# Patient Record
Sex: Female | Born: 1950 | Race: White | Hispanic: No | Smoking: Former smoker
Health system: Southern US, Community
[De-identification: ages and names within clinical notes are randomized; demographics above are authoritative.]

## PROBLEM LIST (undated history)

## (undated) DIAGNOSIS — R001 Bradycardia, unspecified: Secondary | ICD-10-CM

## (undated) DIAGNOSIS — H547 Unspecified visual loss: Secondary | ICD-10-CM

## (undated) DIAGNOSIS — I739 Peripheral vascular disease, unspecified: Secondary | ICD-10-CM

## (undated) DIAGNOSIS — E785 Hyperlipidemia, unspecified: Secondary | ICD-10-CM

## (undated) DIAGNOSIS — H269 Unspecified cataract: Secondary | ICD-10-CM

## (undated) DIAGNOSIS — I459 Conduction disorder, unspecified: Secondary | ICD-10-CM

## (undated) DIAGNOSIS — Z87891 Personal history of nicotine dependence: Secondary | ICD-10-CM

## (undated) DIAGNOSIS — R251 Tremor, unspecified: Secondary | ICD-10-CM

## (undated) DIAGNOSIS — R011 Cardiac murmur, unspecified: Secondary | ICD-10-CM

## (undated) DIAGNOSIS — I779 Disorder of arteries and arterioles, unspecified: Secondary | ICD-10-CM

## (undated) DIAGNOSIS — I2510 Atherosclerotic heart disease of native coronary artery without angina pectoris: Secondary | ICD-10-CM

## (undated) DIAGNOSIS — I639 Cerebral infarction, unspecified: Secondary | ICD-10-CM

## (undated) HISTORY — DX: Tremor, unspecified: R25.1

## (undated) HISTORY — PX: TUBAL LIGATION: SHX77

## (undated) HISTORY — DX: Unspecified visual loss: H54.7

---

## 2015-11-28 ENCOUNTER — Inpatient Hospital Stay
Admit: 2015-11-28 | Discharge: 2015-11-29 | Disposition: A | Discharge status: Home / Self Care | Payer: MEDICAID | Attending: Emergency Medicine

## 2015-11-28 ENCOUNTER — Emergency Department: Admit: 2015-11-29 | Payer: MEDICAID | Primary: Student in an Organized Health Care Education/Training Program

## 2015-11-28 DIAGNOSIS — R531 Weakness: Secondary | ICD-10-CM

## 2015-11-28 NOTE — ED Provider Notes (Signed)
HPI Comments: Patient recently diagnosed with urinary tract infection December 5.  He has not taken any antibiotics for it.  Family states patient has had increased weakness over the past 1-2 weeks.  Has fallen and complains of bilateral knee pain.  Has not had any loss of consciousness.  No chest pain or shortness of breath.  Does have dysuria.  Son also states her right hand has been contracting more.  This was also the side of her prior stroke.    Patient is a 64 y.o. female presenting with fatigue. The history is provided by the patient. No language interpreter was used.   Fatigue   This is a new problem. The current episode started more than 1 week ago. The problem has been gradually worsening. There was no focality noted. Primary symptoms include loss of balance and disorientation.Pertinent negatives include no focal weakness, no slurred speech, no speech difficulty, no agitation, no visual change, no mental status change and no unresponsiveness. There has been no fever. Pertinent negatives include no shortness of breath, no chest pain, no vomiting, no altered mental status, no confusion, no headaches, no nausea, no bowel incontinence and no bladder incontinence. Associated medical issues include dementia and CVA.        Past Medical History:   Diagnosis Date   ??? Chronic obstructive pulmonary disease ([place])    ??? Ill-defined condition      high cholesterol   ??? Stroke [facility])        Past Surgical History:   Procedure Laterality Date   ??? Hx gyn       tubal ligation         History reviewed. No pertinent family history.    Social History     Social History   ??? Marital status: DIVORCED     Spouse name: N/A   ??? Number of children: N/A   ??? Years of education: N/A     Occupational History   ??? Not on file.     Social History Main Topics   ??? Smoking status: Current Every Day Smoker   ??? Smokeless tobacco: Not on file   ??? Alcohol use No   ??? Drug use: No   ??? Sexual activity: Not on file     Other Topics Concern    ??? Not on file     Social History Narrative   ??? No narrative on file         ALLERGIES: Review of patient's allergies indicates not on file.    Review of Systems   Constitutional: Positive for fatigue. Negative for chills and fever.   HENT: Negative for rhinorrhea and sore throat.    Eyes: Negative for pain and redness.   Respiratory: Negative for chest tightness, shortness of breath and wheezing.    Cardiovascular: Negative for chest pain and leg swelling.   Gastrointestinal: Positive for abdominal pain. Negative for bowel incontinence, diarrhea, nausea and vomiting.   Genitourinary: Positive for dysuria. Negative for bladder incontinence, hematuria, vaginal bleeding and vaginal discharge.   Musculoskeletal: Negative for back pain, gait problem, neck pain and neck stiffness.   Skin: Negative for color change and rash.   Neurological: Positive for weakness and loss of balance. Negative for focal weakness, facial asymmetry, speech difficulty, light-headedness, numbness and headaches.   Psychiatric/Behavioral: Negative for agitation and confusion.       Vitals:    11/28/15 1909   BP: 112/67   Pulse: 70   Resp: 18   Temp:  97.5 ??F (36.4 ??C)   SpO2: 99%   Weight: 47.6 kg (105 lb)   Height: 5' 2"  (1.575 m)            Physical Exam   Constitutional: She is oriented to person, place, and time. She appears well-developed and well-nourished. No distress.   HENT:   Head: Normocephalic and atraumatic.   Eyes: Conjunctivae and EOM are normal.   Neck: Normal range of motion. Neck supple.   Cardiovascular: Normal rate and regular rhythm.    No murmur heard.  Pulmonary/Chest: Effort normal and breath sounds normal. She has no wheezes.   Abdominal: Soft. Bowel sounds are normal. There is tenderness (suprapubic). There is no rebound and no guarding.   Musculoskeletal: Normal range of motion. She exhibits no edema or tenderness.   Slight contraction noted of right hand but still with good strength only mildly weaker than left.     Neurological: She is alert and oriented to person, place, and time. No cranial nerve deficit. She exhibits normal muscle tone. Coordination normal.   Skin: Skin is warm and dry.   Nursing note and vitals reviewed.       MDM  Number of Diagnoses or Management Options  Diagnosis management comments: Weakness and possible dehydration. Will discharge.        Amount and/or Complexity of Data Reviewed  Clinical lab tests: ordered and reviewed  Tests in the radiology section of CPT??: ordered and reviewed  Tests in the medicine section of CPT??: ordered and reviewed    Patient Progress  Patient progress: stable    ED Course       Procedures      EKG: normal sinus rhythm, nonspecific ST and T waves changes. Rate 65.        XR CHEST PA LAT (Final result) Result time: 11/28/15 20:04:17   ?? Final result by Leslie Dales, MD (11/28/15 20:04:17)   ?? Impression:   ?? IMPRESSION: No acute cardiopulmonary abnormality.   ?? Narrative:   ?? PA AND LATERAL CHEST X-RAY.    Clinical Indication: Increased weakness    Comparison: No prior    Findings: 2 views of the chest submitted demonstrate the cardiac silhouette and  mediastinum to be unremarkable. There is no pleural effusion or pneumothorax.  The lung parenchyma is clear. The bones are osteopenic.          Results Include:    Recent Results (from the past 24 hour(s))   CBC WITH AUTOMATED DIFF    Collection Time: 11/28/15  7:10 PM   Result Value Ref Range    WBC 9.7 4.3 - 11.1 K/uL    RBC 4.10 4.05 - 5.25 M/uL    HGB 13.3 11.7 - 15.4 g/dL    HCT 40.6 35.8 - 46.3 %    MCV 99.0 (H) 79.6 - 97.8 FL    MCH 32.4 26.1 - 32.9 PG    MCHC 32.8 31.4 - 35.0 g/dL    RDW 12.9 11.9 - 14.6 %    PLATELET 254 150 - 450 K/uL    MPV 11.1 10.8 - 14.1 FL    DF AUTOMATED      NEUTROPHILS 66 43 - 78 %    LYMPHOCYTES 27 13 - 44 %    MONOCYTES 6 4.0 - 12.0 %    EOSINOPHILS 1 0.5 - 7.8 %    BASOPHILS 0 0.0 - 2.0 %    IMMATURE GRANULOCYTES 0.2 0.0 - 5.0 %  ABS. NEUTROPHILS 6.4 1.7 - 8.2 K/UL     ABS. LYMPHOCYTES 2.6 0.5 - 4.6 K/UL    ABS. MONOCYTES 0.6 0.1 - 1.3 K/UL    ABS. EOSINOPHILS 0.1 0.0 - 0.8 K/UL    ABS. BASOPHILS 0.0 0.0 - 0.2 K/UL    ABS. IMM. GRANS. 0.0 0.0 - 0.5 K/UL   METABOLIC PANEL, COMPREHENSIVE    Collection Time: 11/28/15  7:10 PM   Result Value Ref Range    Sodium 145 136 - 145 mmol/L    Potassium 3.5 3.5 - 5.1 mmol/L    Chloride 107 98 - 107 mmol/L    CO2 29 21 - 32 mmol/L    Anion gap 9 7 - 16 mmol/L    Glucose 96 65 - 100 mg/dL    BUN 10 8 - 23 MG/DL    Creatinine 0.59 (L) 0.6 - 1.0 MG/DL    GFR est AA >60 >60 ml/min/1.71m    GFR est non-AA >60 >60 ml/min/1.730m   Calcium 8.7 8.3 - 10.4 MG/DL    Bilirubin, total 0.2 0.2 - 1.1 MG/DL    ALT 27 12 - 65 U/L    AST 22 15 - 37 U/L    Alk. phosphatase 132 50 - 136 U/L    Protein, total 7.3 6.3 - 8.2 g/dL    Albumin 3.3 3.2 - 4.6 g/dL    Globulin 4.0 (H) 2.3 - 3.5 g/dL    A-G Ratio 0.8 (L) 1.2 - 3.5     TROPONIN I    Collection Time: 11/28/15  7:10 PM   Result Value Ref Range    Troponin-I, Qt. <0.02 (L) 0.02 - 0.05 NG/ML   LIPASE    Collection Time: 11/28/15  7:10 PM   Result Value Ref Range    Lipase 140 73 - 393 U/L   EKG, 12 LEAD, INITIAL    Collection Time: 11/28/15  7:15 PM   Result Value Ref Range    Systolic BP  mmHg    Diastolic BP  mmHg    Ventricular Rate 65 BPM    Atrial Rate 65 BPM    P-R Interval 156 ms    QRS Duration 84 ms    Q-T Interval 440 ms    QTC Calculation (Bezet) 457 ms    Calculated P Axis 67 degrees    Calculated R Axis 70 degrees    Calculated T Axis 50 degrees    Diagnosis Normal sinus rhythm  Normal ECG        UA neg.       CT HEAD WO CONT (Final result) Result time: 11/28/15 21:06:15   ?? Final result by RoLeslie DalesMD (11/28/15 21:06:15)   ?? Impression:   ?? IMPRESSION:   1. Marked atrophy of the bilateral frontal lobes with extensive underlying  subcortical white matter hypoattenuation. Encephalomalacia is also a  consideration. Note the more posterior brain is spared.   2. No acute intracranial abnormality.       ?? Narrative:   ?? CT of the head without contrast.    CLINICAL INDICATION: Increased weakness over the last week with multiple falls  and head trauma    PROCEDURE: Serial thin section axial images are obtained from the cranial vertex  through the skull base without the administration of intravenous contrast.   Radiation dose reduction techniques were used for this study. Our CT scanners  use one or all of the following: Automated exposure control, adjusted of the  mA  and/or kV according to patient size, iterative reconstruction    COMPARISON: No prior.    FINDINGS: There is no acute intracranial hemorrhage, mass, or mass effect. No  abnormal extra-axial fluid collections identified. There is no hydrocephalus.  The basilar cisterns are widely patent. Marked, bilateral frontal lobe atrophy  with extensive subcortical white matter hypoattenuation in the bilateral frontal  lobes with relative sparing of the parietal and temporal lobes as well as the  occipital lobes. Mild encephalomalacia of the frontal lobes is a possibility. No  skull fracture or aggressive osseous lesion noted. The mastoid air cells and  included paranasal sinuses are clear.     ??    ??    ?? XR CHEST PA LAT (Final result) Result time: 11/28/15 20:04:17   ?? Final result by Leslie Dales, MD (11/28/15 20:04:17)   ?? Impression:   ?? IMPRESSION: No acute cardiopulmonary abnormality.   ?? Narrative:   ?? PA AND LATERAL CHEST X-RAY.    Clinical Indication: Increased weakness    Comparison: No prior    Findings: 2 views of the chest submitted demonstrate the cardiac silhouette and  mediastinum to be unremarkable. There is no pleural effusion or pneumothorax.  The lung parenchyma is clear. The bones are osteopenic.     ??    ??

## 2015-11-28 NOTE — ED Notes (Signed)
I have reviewed discharge instructions with the patient.  The patient verbalized understanding. Patient appears in no acute distress upon discharge.

## 2015-11-28 NOTE — ED Triage Notes (Addendum)
Pt arrives with son and caregiver. Visitors states pt with increased weakness over last week with multiple falls. Pt admits to hitting head. Pt denies syncope. C/o bilateral knee pain. Denies chest pain or shortness of breath. Denies n/v. Admits to diarrhea. Denies fever. Admits to urinary pain. States diagnosed with uti 12/5 at [facility] however family unable to fill abx until medicaid activated couple days pta. Family also states contractures to right hand, onset approx 1 week pta. 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

## 2015-11-29 LAB — METABOLIC PANEL, COMPREHENSIVE
A-G Ratio: 0.8 — ABNORMAL LOW (ref 1.2–3.5)
ALT (SGPT): 27 U/L (ref 12–65)
AST (SGOT): 22 U/L (ref 15–37)
Albumin: 3.3 g/dL (ref 3.2–4.6)
Alk. phosphatase: 132 U/L (ref 50–136)
Anion gap: 9 mmol/L (ref 7–16)
BUN: 10 MG/DL (ref 8–23)
Bilirubin, total: 0.2 MG/DL (ref 0.2–1.1)
CO2: 29 mmol/L (ref 21–32)
Calcium: 8.7 MG/DL (ref 8.3–10.4)
Chloride: 107 mmol/L (ref 98–107)
Creatinine: 0.59 MG/DL — ABNORMAL LOW (ref 0.6–1.0)
GFR est AA: >60 mL/min/{1.73_m2} (ref >60)
GFR est non-AA: >60 mL/min/{1.73_m2} (ref >60)
Globulin: 4 g/dL — ABNORMAL HIGH (ref 2.3–3.5)
Glucose: 96 mg/dL (ref 65–100)
Potassium: 3.5 mmol/L (ref 3.5–5.1)
Protein, total: 7.3 g/dL (ref 6.3–8.2)
Sodium: 145 mmol/L (ref 136–145)

## 2015-11-29 LAB — EKG, 12 LEAD, INITIAL
Atrial Rate: 65 {beats}/min
Calculated P Axis: 67 degrees
Calculated R Axis: 70 degrees
Calculated T Axis: 50 degrees
Diagnosis: NORMAL
P-R Interval: 156 ms
Q-T Interval: 440 ms
QRS Duration: 84 ms
QTC Calculation (Bezet): 457 ms
Ventricular Rate: 65 {beats}/min

## 2015-11-29 LAB — CBC WITH AUTOMATED DIFF
ABS. BASOPHILS: 0 10*3/uL (ref 0.0–0.2)
ABS. EOSINOPHILS: 0.1 10*3/uL (ref 0.0–0.8)
ABS. IMM. GRANS.: 0 10*3/uL (ref 0.0–0.5)
ABS. LYMPHOCYTES: 2.6 10*3/uL (ref 0.5–4.6)
ABS. MONOCYTES: 0.6 10*3/uL (ref 0.1–1.3)
ABS. NEUTROPHILS: 6.4 10*3/uL (ref 1.7–8.2)
BASOPHILS: 0 % (ref 0.0–2.0)
EOSINOPHILS: 1 % (ref 0.5–7.8)
HCT: 40.6 % (ref 35.8–46.3)
HGB: 13.3 g/dL (ref 11.7–15.4)
IMMATURE GRANULOCYTES: 0.2 % (ref 0.0–5.0)
LYMPHOCYTES: 27 % (ref 13–44)
MCH: 32.4 PG (ref 26.1–32.9)
MCHC: 32.8 g/dL (ref 31.4–35.0)
MCV: 99 FL — ABNORMAL HIGH (ref 79.6–97.8)
MONOCYTES: 6 % (ref 4.0–12.0)
MPV: 11.1 FL (ref 10.8–14.1)
NEUTROPHILS: 66 % (ref 43–78)
PLATELET: 254 10*3/uL (ref 150–450)
RBC: 4.1 M/uL (ref 4.05–5.25)
RDW: 12.9 % (ref 11.9–14.6)
WBC: 9.7 10*3/uL (ref 4.3–11.1)

## 2015-11-29 LAB — LIPASE: Lipase: 140 U/L (ref 73–393)

## 2015-11-29 LAB — TROPONIN I: Troponin-I, Qt.: <0.02 NG/ML — ABNORMAL LOW (ref 0.02–0.05)

## 2015-11-29 MED ORDER — SODIUM CHLORIDE 0.9% BOLUS IV
0.9 % | Freq: Once | INTRAVENOUS | Status: AC
Start: 2015-11-29 — End: 2015-11-28
  Administered 2015-11-29: 02:00:00 via INTRAVENOUS

## 2015-11-29 MED ORDER — KETOROLAC TROMETHAMINE 30 MG/ML INJECTION
30 mg/mL (1 mL) | INTRAMUSCULAR | Status: AC
Start: 2015-11-29 — End: 2015-11-28
  Administered 2015-11-29: 02:00:00 via INTRAVENOUS

## 2015-11-29 MED ORDER — ONDANSETRON (PF) 4 MG/2 ML INJECTION
4 mg/2 mL | INTRAMUSCULAR | Status: AC
Start: 2015-11-29 — End: 2015-11-28
  Administered 2015-11-29: 02:00:00 via INTRAVENOUS

## 2015-11-29 MED FILL — ONDANSETRON (PF) 4 MG/2 ML INJECTION: 4 mg/2 mL | INTRAMUSCULAR | Qty: 2

## 2015-11-29 MED FILL — KETOROLAC TROMETHAMINE 30 MG/ML INJECTION: 30 mg/mL (1 mL) | INTRAMUSCULAR | Qty: 1

## 2018-03-23 ENCOUNTER — Inpatient Hospital Stay (HOSPITAL_COMMUNITY): Payer: Medicare Other

## 2018-03-23 ENCOUNTER — Inpatient Hospital Stay (HOSPITAL_COMMUNITY)
Admission: AD | Admit: 2018-03-23 | Discharge: 2018-03-24 | Disposition: A | Discharge status: Home / Self Care | Payer: Medicare Other | Source: Ambulatory Visit | Attending: Emergency Medicine | Admitting: Emergency Medicine

## 2018-03-23 ENCOUNTER — Encounter (HOSPITAL_COMMUNITY): Payer: Self-pay

## 2018-03-23 DIAGNOSIS — R112 Nausea with vomiting, unspecified: Secondary | ICD-10-CM | POA: Insufficient documentation

## 2018-03-23 DIAGNOSIS — I10 Essential (primary) hypertension: Secondary | ICD-10-CM | POA: Diagnosis not present

## 2018-03-23 DIAGNOSIS — J449 Chronic obstructive pulmonary disease, unspecified: Secondary | ICD-10-CM | POA: Diagnosis not present

## 2018-03-23 DIAGNOSIS — N390 Urinary tract infection, site not specified: Secondary | ICD-10-CM

## 2018-03-23 DIAGNOSIS — Z79899 Other long term (current) drug therapy: Secondary | ICD-10-CM | POA: Insufficient documentation

## 2018-03-23 DIAGNOSIS — R001 Bradycardia, unspecified: Secondary | ICD-10-CM | POA: Insufficient documentation

## 2018-03-23 DIAGNOSIS — Z880 Allergy status to penicillin: Secondary | ICD-10-CM | POA: Diagnosis not present

## 2018-03-23 DIAGNOSIS — F17200 Nicotine dependence, unspecified, uncomplicated: Secondary | ICD-10-CM | POA: Insufficient documentation

## 2018-03-23 DIAGNOSIS — I739 Peripheral vascular disease, unspecified: Secondary | ICD-10-CM | POA: Diagnosis not present

## 2018-03-23 DIAGNOSIS — I6930 Unspecified sequelae of cerebral infarction: Secondary | ICD-10-CM | POA: Insufficient documentation

## 2018-03-23 DIAGNOSIS — R531 Weakness: Secondary | ICD-10-CM | POA: Insufficient documentation

## 2018-03-23 DIAGNOSIS — R079 Chest pain, unspecified: Secondary | ICD-10-CM | POA: Insufficient documentation

## 2018-03-23 DIAGNOSIS — H547 Unspecified visual loss: Secondary | ICD-10-CM | POA: Insufficient documentation

## 2018-03-23 DIAGNOSIS — R197 Diarrhea, unspecified: Secondary | ICD-10-CM | POA: Insufficient documentation

## 2018-03-23 HISTORY — DX: Cardiac murmur, unspecified: R01.1

## 2018-03-23 HISTORY — DX: Cerebral infarction, unspecified: I63.9

## 2018-03-23 LAB — CBC WITH DIFFERENTIAL/PLATELET
BASOS ABS: 0 10*3/uL (ref 0.0–0.1)
Basophils Relative: 1 %
EOS PCT: 3 %
Eosinophils Absolute: 0.2 10*3/uL (ref 0.0–0.7)
HEMATOCRIT: 40.8 % (ref 36.0–46.0)
Hemoglobin: 13.3 g/dL (ref 12.0–15.0)
Lymphocytes Relative: 40 %
Lymphs Abs: 3.2 10*3/uL (ref 0.7–4.0)
MCH: 32.8 pg (ref 26.0–34.0)
MCHC: 32.6 g/dL (ref 30.0–36.0)
MCV: 100.5 fL — ABNORMAL HIGH (ref 78.0–100.0)
Monocytes Absolute: 0.7 10*3/uL (ref 0.1–1.0)
Monocytes Relative: 8 %
NEUTROS ABS: 4 10*3/uL (ref 1.7–7.7)
Neutrophils Relative %: 48 %
PLATELETS: 278 10*3/uL (ref 150–400)
RBC: 4.06 MIL/uL (ref 3.87–5.11)
RDW: 13.2 % (ref 11.5–15.5)
WBC: 8.1 10*3/uL (ref 4.0–10.5)

## 2018-03-23 LAB — URINALYSIS, ROUTINE W REFLEX MICROSCOPIC
Bilirubin Urine: NEGATIVE
GLUCOSE, UA: NEGATIVE mg/dL
Ketones, ur: NEGATIVE mg/dL
Nitrite: NEGATIVE
Protein, ur: NEGATIVE mg/dL
Specific Gravity, Urine: 1.023 (ref 1.005–1.030)
pH: 5 (ref 5.0–8.0)

## 2018-03-23 LAB — COMPREHENSIVE METABOLIC PANEL
ALT: 17 U/L (ref 14–54)
AST: 24 U/L (ref 15–41)
Albumin: 3.4 g/dL — ABNORMAL LOW (ref 3.5–5.0)
Alkaline Phosphatase: 103 U/L (ref 38–126)
Anion gap: 9 (ref 5–15)
BUN: 13 mg/dL (ref 6–20)
CHLORIDE: 109 mmol/L (ref 101–111)
CO2: 22 mmol/L (ref 22–32)
CREATININE: 0.72 mg/dL (ref 0.44–1.00)
Calcium: 9.1 mg/dL (ref 8.9–10.3)
GFR calc Af Amer: >60 mL/min (ref >60)
GFR calc non Af Amer: >60 mL/min (ref >60)
GLUCOSE: 111 mg/dL — AB (ref 65–99)
Potassium: 3.7 mmol/L (ref 3.5–5.1)
Sodium: 140 mmol/L (ref 135–145)
Total Bilirubin: 0.8 mg/dL (ref 0.3–1.2)
Total Protein: 6.4 g/dL — ABNORMAL LOW (ref 6.5–8.1)

## 2018-03-23 LAB — C DIFFICILE QUICK SCREEN W PCR REFLEX
C Diff antigen: NEGATIVE
C Diff interpretation: NOT DETECTED
C Diff toxin: NEGATIVE

## 2018-03-23 LAB — I-STAT TROPONIN, ED
TROPONIN I, POC: 0 ng/mL (ref 0.00–0.08)
Troponin i, poc: 0.01 ng/mL (ref 0.00–0.08)

## 2018-03-23 LAB — LIPASE, BLOOD: Lipase: 34 U/L (ref 11–51)

## 2018-03-23 MED ORDER — NITROFURANTOIN MONOHYD MACRO 100 MG PO CAPS: 100.0000 mg | ORAL_CAPSULE | Freq: Two times a day (BID) | ORAL | 0 refills | Status: AC

## 2018-03-23 MED ORDER — GABAPENTIN 300 MG PO CAPS: 300.0000 mg | ORAL_CAPSULE | Freq: Three times a day (TID) | ORAL | 0 refills | Status: AC

## 2018-03-23 MED ORDER — SODIUM CHLORIDE 0.9 % IV SOLN
Freq: Once | INTRAVENOUS | Status: AC
  Administered 2018-03-23: 15:00:00 via INTRAVENOUS

## 2018-03-23 MED ORDER — ASPIRIN 81 MG PO CHEW: 324.0000 mg | CHEWABLE_TABLET | Freq: Once | ORAL | Status: DC

## 2018-03-23 MED ORDER — OMEPRAZOLE 20 MG PO CPDR: 20.0000 mg | DELAYED_RELEASE_CAPSULE | Freq: Two times a day (BID) | ORAL | 0 refills | Status: DC

## 2018-03-23 MED ORDER — BUSPIRONE HCL 15 MG PO TABS: 7.5000 mg | ORAL_TABLET | Freq: Two times a day (BID) | ORAL | 0 refills | Status: AC

## 2018-03-23 MED ORDER — ACETAMINOPHEN 325 MG PO TABS
650.0000 mg | ORAL_TABLET | Freq: Once | ORAL | Status: AC
Start: 2018-03-23 — End: 2018-03-23
  Administered 2018-03-23: 650 mg via ORAL
  Filled 2018-03-23: qty 2

## 2018-03-23 MED ORDER — NITROFURANTOIN MONOHYD MACRO 100 MG PO CAPS
100.0000 mg | ORAL_CAPSULE | Freq: Once | ORAL | Status: AC
  Administered 2018-03-23: 100 mg via ORAL
  Filled 2018-03-23: qty 1

## 2018-03-23 MED ORDER — SODIUM CHLORIDE 0.9 % IV BOLUS
500.0000 mL | Freq: Once | INTRAVENOUS | Status: AC
Start: 2018-03-23 — End: 2018-03-23
  Administered 2018-03-23: 500 mL via INTRAVENOUS

## 2018-03-23 NOTE — ED Provider Notes (Signed)
I assumed care of patient at shift change, briefly patient is here for evaluation of left arm and leg weakness and musculoskeletal chest pain.  Please see note from previous provider for full H and P.    Patient's MRI came back without acute abnormalities.  Patient has significant social issues, including the fact that approximately 2 weeks ago her son took her out of the nursing home that she was in in [place], they took a bus here and she came here by ambulance.  Reportedly her son does not have a car and therefore would be unable to pick her up.  Patient does not meet admission criteria.  Blood pressure medication held due to bradycardia.  Patient was seen by case management and social work.  Attempted to discharge, however patient does not have a ride and I do not feel it would be safe for her to be left alone at the motel.  Patient will be a border in the ER.  Recommend continued social work/case management involvement in the morning.  Dg Chest 2 View  Result Date: 03/23/2018 CLINICAL DATA:  Left-sided chest pain and shortness of breath. EXAM: CHEST - 2 VIEW COMPARISON:  None. FINDINGS: The heart size and pulmonary vascularity are normal and the lungs are clear. No effusions. Osteopenia. Moderate compression deformity of 1 of the lower thoracic vertebra, age indeterminate. IMPRESSION: 1. Compression deformity of 1 of the lower thoracic vertebra, age indeterminate. 2. Osteopenia. 3. No other significant abnormalities. Electronically Signed   By: Francene BoyersJames  Maxwell M.D.   On: 03/23/2018 07:50   Ct Head Wo Contrast  Result Date: 03/23/2018 CLINICAL DATA:  Weakness EXAM: CT HEAD WITHOUT CONTRAST TECHNIQUE: Contiguous axial images were obtained from the base of the skull through the vertex without intravenous contrast. COMPARISON:  None. FINDINGS: Brain: Areas of prior ischemia are noted in the parietal lobes bilaterally near the vertex. Mild atrophic changes are noted. No findings to suggest acute  hemorrhage, acute infarction or space-occupying mass lesion are noted. Vascular: No hyperdense vessel or unexpected calcification. Skull: Normal. Negative for fracture or focal lesion. Sinuses/Orbits: No acute finding. Other: None. IMPRESSION: Chronic atrophic and ischemic changes without acute abnormality. Electronically Signed   By: Alcide CleverMark  Lukens M.D.   On: 03/23/2018 07:55   Mr Brain Wo Contrast  Result Date: 03/23/2018 CLINICAL DATA:  67 y/o F; left upper extremity and left lower extremity weakness. Left facial droop and slurred speech. EXAM: MRI HEAD WITHOUT CONTRAST TECHNIQUE: Multiplanar, multiecho pulse sequences of the brain and surrounding structures were obtained without intravenous contrast. COMPARISON:  03/23/2018 CT head FINDINGS: Brain: No acute infarction, hemorrhage, hydrocephalus, extra-axial collection or mass lesion. Chronic infarctions are present in the bilateral frontal lobes and right parietal lobe. There is faint siderosis associated with the left frontal cortical infarction. There are microvascular ischemic changes of white matter and parenchymal volume loss of the brain. Vascular: Normal flow voids. Skull and upper cervical spine: Normal marrow signal. Sinuses/Orbits: Opacification of the right mastoid tips. Left intra-ocular lens replacement. Otherwise negative. Other: None. IMPRESSION: 1. No acute intracranial abnormality identified. 2. Bifrontal and right parietal chronic infarctions. Electronically Signed   By: Mitzi HansenLance  Furusawa-Stratton M.D.   On: 03/23/2018 17:18       Cristina GongHammond, Elon Lomeli W, PA-C 03/24/18 [id number]    Linwood DibblesKnapp, Jon, MD 03/24/18 1224

## 2018-03-23 NOTE — Discharge Instructions (Addendum)
Today your lab work was normal. You had a normal white blood cell count and a normal hemoglobin. You kidney function and liver function were normal. Your electrolytes were normal. Your cardiac enzymes were normal. Your urinalysis showed evidence of a possible infection and you were given an antibiotic to treat this infection. The antibiotic is called Macrobid. Please discuss with your pharmacist about potential interactions of this medication with your current medications.  I did not re-prescribe your lopressor as your heart rate is low and this could make it worse.    Your chest xray did not show any evidence of pneumonia or a collapsed lung. The CT scan of your head did not show any acute abnormality.   You will need to follow up with your primary care doctor in 3-3 days for evaluation.  Your primary care doctor there is information on your discharge paperwork for instructions to establish care with a primary doctor.  You  will need to return to the ER for any weakness, chest pain, shortness of breath, abdominal pain, persistent vomiting, fevers or any new or worsening symptoms.

## 2018-03-23 NOTE — ED Provider Notes (Signed)
Medical screening examination/treatment/procedure(s) were conducted as a shared visit with non-physician practitioner(s) and myself.  I personally evaluated the patient during the encounter. Briefly, the patient is a 67 y.o. female here with 2 days of vomiting and diarrhea with mild dysuria and questionable left facial droop with left arm weakeness noted by son yesterday. Droop and weakness now resolved. H/o CVA with right sided deficits. Exam with mild bilateral lower extremity weakness.  5 out of 5 BUE upper extremities strength. No facial droop.  Patient also complains of left shoulder, chest and arm pain. Pain is consistent with muscular pain.  EKG without acute ischemic changes.  Initial troponin negative.  Negative chest x-ray.  CT head unremarkable.  Screening labs negative.  Will obtain an MRI to rule out CVA.  MRI pending.  Patient signed out to oncoming team.   EKG Interpretation  Date/Time:  Wednesday March 23 2018 06:54:32 EDT Ventricular Rate:  85 PR Interval:    QRS Duration: 91 QT Interval:  472 QTC Calculation: 484 R Axis:   73 Text Interpretation:  Sinus rhythm Ventricular bigeminy Minimal ST depression, inferior leads NO STEMI No old tracing to compare Confirmed by Drema Pryardama, Pedro [phone]([id number]) on 03/23/2018 6:58:55 AM            Eudelia Bunchardama, Amadeo GarnetPedro Eduardo, MD 03/23/18 [phone]

## 2018-03-23 NOTE — ED Notes (Signed)
Pt states she is not claustrophobic; would like pain meds for her lower back pain. MD aware

## 2018-03-23 NOTE — ED Triage Notes (Signed)
Pt. [facility] reports of n/v/d. Pt. Also reports weakness. Has hx. Of stroke x4 with R side deficit. Pt. Is legally blind. A/0 X4

## 2018-03-23 NOTE — ED Notes (Signed)
Pts son would like an update on pts status.  Mary HarryNicolas Cook: [phone]

## 2018-03-23 NOTE — Clinical Social Work Note (Signed)
Clinical Social Work Assessment  Patient Details  Name: Mary Nguyen MRN: [id number] Date of Birth: Jul 10, 1951  Date of referral:  03/23/18               Reason for consult:  Other (Comment Required)(Pt living in hotel)                Permission sought to share information with:  Case Manager, Family Supports Permission granted to share information::  Yes, Verbal Permission Granted  Name::        Agency::     Relationship::     Contact Information:     Housing/Transportation Living arrangements for the past 2 months:  [place], Hotel/Motel Source of Information:  Patient Patient Interpreter Needed:  None Criminal Activity/Legal Involvement Pertinent to Current Situation/Hospitalization:  No - Comment as needed Significant Relationships:  Adult Children Lives with:  Adult Children Do you feel safe going back to the place where you live?  Yes Need for family participation in patient care:  Yes (Comment)  Care giving concerns:  Care giving concerns expressed for pt due to living in a hotel in [place]. Pt is ambulatory, but needs assistance with walking due to blindness. Pt was recently discharged from a SNF in [place] area last week. After d/c from SNF, pt, pt's son, and pt's son's girlfriend moved to [facility] and are staying at [facility].   Social Worker assessment / plan:  CSW consulted due to pt's living situation. CSW and CM met with pt at pt's bedside. Pt informed staff that she recently moved to [place] from [place] [employer] area. She is staying at [facility] with her son and her son's girlfriend. Pt came to [facility] from [facility], where she became ill while visiting her son's girlfriend in the hospital.   Employment status:    Insurance information:  Medicare PT Recommendations:  Not assessed at this time Information / Referral to community resources:  Other (Comment Required)([employer])  Patient/Family's Response  to care:  Pt's is receiving continued work up at this time. Pt and pt's family are agreeable to plan of care.   Patient/Family's Understanding of and Emotional Response to Diagnosis, Current Treatment, and Prognosis:  Pt and pt's son did not have any questions at this time.   Emotional Assessment Appearance:  Appears older than stated age Attitude/Demeanor/Rapport:    Affect (typically observed):  Accepting, Calm, Pleasant Orientation:  Oriented to Self, Oriented to Place, Oriented to  Time, Oriented to Situation Alcohol / Substance use:    Psych involvement (Current and /or in the community):  No (Comment)  Discharge Needs  Concerns to be addressed:  Home Safety Concerns Readmission within the last 30 days:  No Current discharge risk:  None Barriers to Discharge:  No Barriers Identified   Wendelyn Breslow, LCSW 03/23/2018, 7:59 PM

## 2018-03-23 NOTE — Progress Notes (Signed)
CSW received verbal permission from pt to call pt's son, Janyth Pupaicholas. CSW called son at [phone]. CSW left HIPAA complaint voicemail for son.   CSW spoke with pt's son, Janyth Pupaicholas, at [phone], informing him that pt is ready for discharge.   Update: More tests are being conducted on pt. CSW called Janyth Pupaicholas back and updated him.   Montine CircleKelsy Ricquel Foulk, Silverio LayLCSWA [facility]  [phone]

## 2018-03-23 NOTE — Care Management (Addendum)
ED CM met with patient at bedside to discuss care transitional planning. Patient reports being  discharged from a SNF in [place] recently. Patient's son and his pregnant girlfriend decided to relocate to [facility] and brought her along.  Patient and Son staying [facility].  Son's SO was taken to Maternity Admission for evaluation where patient began having n/v/d and was evaluated and transported to [facility] ED for further evaluation. Patient states, she needs to get her medication that she did not receive from the snf, and ED  follow up. Discussed [employer] in the area.  Patient agreeable to establish care with [facility]

## 2018-03-23 NOTE — ED Notes (Addendum)
Placed pt on bed pan.

## 2018-03-23 NOTE — ED Triage Notes (Signed)
Please call Family at  [phone]

## 2018-03-23 NOTE — MAU Note (Signed)
Pt reporting  Diarrhea since early Tuesday morning. Approximately 5 episodes.  Pt has hx of 4 strokes, latest one in 2016 (right side).  COPD, peripheral artery disease. Pt also blind since 2013. Pt lives in [place], has just moved with son. Was previously in nursing home.

## 2018-03-23 NOTE — ED Notes (Signed)
Got patient off the bedpan patient is resting with call bell in reach 
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
## 2018-03-23 NOTE — ED Provider Notes (Signed)
[facility] EMERGENCY DEPARTMENT Provider Note   CSN: [id number] Arrival date & time: 03/23/18  0221     History   Chief Complaint Chief Complaint  Patient presents with  . Extremity Weakness  . Chest Pain  . Emesis  . Diarrhea    HPI Boneta Standre is a 67 y.o. female.  HPI   Patient is a 67 year old female with history of heart murmur, stroke (chronic right sided deficits), and blindness bilaterally who presents the ED today to be evaluated for left upper extremity and left lower extremity weakness that her son states he noted when he arrived home from work yesterday around 6 PM.  He also felt that patient had left-sided facial droop as well as slurred speech.  He feels that her slurred speech and facial droop have improved today.  On evaluation, pt states she has not noticed any new weakness to her left upper and left lower extremity. She does endorse pain to the left upper extremity which she thinks started this morning. Also c/o paresthesias/numbness to the left hand which have been intermittent for several weeks. She also states that she has left-sided chest pain and left upper back pain which she thinks also began this morning.  She has had shortness of breath though it is intermittent.  She is also complaining of abdominal pain which she says she has had chronically and it is unchanged today.  She also endorses nausea, vomiting, and diarrhea.  States that nausea and vomiting began last week and she only had one episode of vomiting at home.  She has had no further episodes of vomiting though she has had intermittent diarrhea throughout the week.  She denies seeing any blood in her stool.  She is endorsing dysuria, but denies hematuria or frequency.  Patient son states that patient has history of dementia though is normally oriented.  He states that he and the patient recently moved here from [address].  In [place] patient was admitted to a nursing home for a  heart problem.  She has been out of the nursing home for about 2 weeks now since they moved to [place].  Patient states she has a history of low heart rate.  She denies a known history of atrial fibrillation.  Past Medical History:  Diagnosis Date  . Heart murmur   . Stroke [facility])     There are no active problems to display for this patient.     OB History   None      Home Medications    Prior to Admission medications   Medication Sig Start Date End Date Taking? Authorizing Provider  busPIRone (BUSPAR) 15 MG tablet Take 7.5 mg by mouth 2 (two) times daily.   Yes [provider]  gabapentin (NEURONTIN) 300 MG capsule Take 300 mg by mouth every 6 (six) hours.   Yes [provider]  metoprolol tartrate (LOPRESSOR) 25 MG tablet Take 25 mg by mouth 2 (two) times daily.   Yes [provider]  omeprazole (PRILOSEC) 20 MG capsule Take 20 mg by mouth 2 (two) times daily before a meal.   Yes [provider]  nitrofurantoin, macrocrystal-monohydrate, (MACROBID) 100 MG capsule Take 1 capsule (100 mg total) by mouth 2 (two) times daily for 5 days. 03/23/18 03/28/18  Chester Sibert S, PA-C    Family History No family history on file.  Social History Social History   Tobacco Use  . Smoking status: Current Every Day Smoker  Substance Use Topics  .  Alcohol use: Not on file  . Drug use: Not on file     Allergies   Penicillins   Review of Systems Review of Systems  Constitutional: Negative for fever.  HENT: Negative for sore throat.   Eyes:       Blind bilat  Respiratory: Positive for shortness of breath. Negative for cough.   Cardiovascular: Positive for chest pain.  Gastrointestinal: Positive for abdominal pain. Negative for constipation, diarrhea, nausea and vomiting.  Genitourinary: Positive for dysuria.  Musculoskeletal: Positive for back pain and neck pain.  Skin: Negative for wound.  Neurological: Positive for weakness and numbness.  Negative for dizziness, light-headedness and headaches.     Physical Exam Updated Vital Signs BP (!) 136/25   Pulse (!) 43   Temp 97.7 F (36.5 C) (Oral)   Resp 15   Ht 5\' 2"  (1.575 m)   Wt 49.9 kg (110 lb)   SpO2 100%   BMI 20.12 kg/m   Physical Exam  Constitutional:  Non-toxic appearance. No distress.  HENT:  Head: Normocephalic and atraumatic.  Mucous membranes are dry. No pharyngeal erythema or tonsillar exudate.  Eyes:  Discoloration to right pupil. Pt blind bilat.   Neck: Normal range of motion.  ttp to cspine  Cardiovascular: Regular rhythm.  No murmur heard. Pulses:      Radial pulses are 1+ on the right side, and 1+ on the left side.       Dorsalis pedis pulses are 2+ on the right side, and 2+ on the left side.  Pulmonary/Chest: Effort normal and breath sounds normal. She has no decreased breath sounds. She has no wheezes. She has no rales.  Musculoskeletal:  Pt has ttp to medial and inferior portion of the scapula as well as the chest wall that reproduces her pain. No BLE swelling or pain to calves.  Neurological: She is alert.  Mental Status:  Alert, no slurred speech. No evidence of aphasia. Able to follow 2 step commands without difficulty.  Cranial Nerves:  II:  Not assessed, pt is blind III,IV, VI: ptosis not present V,VII: smile symmetric, facial sensation decreased on left (decreased on right during re-eval) VIII: hearing grossly normal to voice  X: uvula elevates symmetrically  XI: not assessed XII: midline tongue extension without fassiculations Motor:  Normal tone.decreased strength throughout however appears to be grossly symmetric, pt has pain with LUE strength testing Sensory: light touch normal in all extremities. Cerebellar: unable to assess as pt is blind CV: 2+ radial and DP/PT pulses  Skin: Skin is warm and dry. Capillary refill takes less than 2 seconds.  Psychiatric: She has a normal mood and affect.  Nursing note and vitals  reviewed.    ED Treatments / Results  Labs (all labs ordered are listed, but only abnormal results are displayed) Labs Reviewed  CBC WITH DIFFERENTIAL/PLATELET - Abnormal; Notable for the following components:      Result Value   MCV 100.5 (*)    All other components within normal limits  COMPREHENSIVE METABOLIC PANEL - Abnormal; Notable for the following components:   Glucose, Bld 111 (*)    Total Protein 6.4 (*)    Albumin 3.4 (*)    All other components within normal limits  URINALYSIS, ROUTINE W REFLEX MICROSCOPIC - Abnormal; Notable for the following components:   APPearance HAZY (*)    Hgb urine dipstick SMALL (*)    Leukocytes, UA SMALL (*)    Bacteria, UA RARE (*)    Squamous Epithelial /  LPF 6-30 (*)    All other components within normal limits  URINE CULTURE  LIPASE, BLOOD  I-STAT TROPONIN, ED  I-STAT TROPONIN, ED    EKG Initial ECG 6:54 AM Date Wed April 17 Ventricular rate 85 Sinus rhythm with ventricular bigeminy Minimal ST depression, inferior leads, No STEMI No old tracing to compare   EKG Interpretation  Date/Time:  Wednesday March 23 2018 08:07:46 EDT Ventricular Rate:  41 PR Interval:    QRS Duration: 99 QT Interval:  538 QTC Calculation: 445 R Axis:   68 Text Interpretation:  Sinus bradycardia Minimal ST depression, diffuse leads resolved bigeminy Otherwise no significant change Confirmed by Drema Pry [phone]) on 03/23/2018 3:51:35 PM   Radiology Dg Chest 2 View  Result Date: 03/23/2018 CLINICAL DATA:  Left-sided chest pain and shortness of breath. EXAM: CHEST - 2 VIEW COMPARISON:  None. FINDINGS: The heart size and pulmonary vascularity are normal and the lungs are clear. No effusions. Osteopenia. Moderate compression deformity of 1 of the lower thoracic vertebra, age indeterminate. IMPRESSION: 1. Compression deformity of 1 of the lower thoracic vertebra, age indeterminate. 2. Osteopenia. 3. No other significant abnormalities.  Electronically Signed   By: Francene Boyers M.D.   On: 03/23/2018 07:50   Ct Head Wo Contrast  Result Date: 03/23/2018 CLINICAL DATA:  Weakness EXAM: CT HEAD WITHOUT CONTRAST TECHNIQUE: Contiguous axial images were obtained from the base of the skull through the vertex without intravenous contrast. COMPARISON:  None. FINDINGS: Brain: Areas of prior ischemia are noted in the parietal lobes bilaterally near the vertex. Mild atrophic changes are noted. No findings to suggest acute hemorrhage, acute infarction or space-occupying mass lesion are noted. Vascular: No hyperdense vessel or unexpected calcification. Skull: Normal. Negative for fracture or focal lesion. Sinuses/Orbits: No acute finding. Other: None. IMPRESSION: Chronic atrophic and ischemic changes without acute abnormality. Electronically Signed   By: Alcide Clever M.D.   On: 03/23/2018 07:55    Procedures Procedures (including critical care time)  Medications Ordered in ED Medications  sodium chloride 0.9 % bolus 500 mL (0 mLs Intravenous Stopped 03/23/18 1210)  0.9 %  sodium chloride infusion ( Intravenous New Bag/Given 03/23/18 1520)     Initial Impression / Assessment and Plan / ED Course  I have reviewed the triage vital signs and the nursing notes.  Pertinent labs & imaging results that were available during my care of the patient were reviewed by me and considered in my medical decision making (see chart for details).  Discussed pt presentation and exam findings with Dr. Eudelia Bunch, who personally evaluated the pt with myself. He agrees with current workup and plan for MRI. If MRI is negative he agrees with the plan for discharge with abx for UTI and PCP. Does not feel that additional workup of bradycardia is necessary at this time given patient is in NSR, has a h/o bradycardia, and she is asymptomatic.   Final Clinical Impressions(s) / ED Diagnoses   Final diagnoses:  Weakness  Bradycardia  Urinary tract infection without  hematuria, site unspecified   Patient presenting for evaluation of left-sided weakness noted by her son, nausea vomiting diarrhea, and chest pain.  Overall patient's vital signs are stable however she does have persistent bradycardia throughout her visit which she states she has a history of.  With regard to weakness, patient denies any weakness to the left side.  Her physical exam does not show any focal deficits on the left side.  Her CT head was negative for  acute cranial hemorrhage or evidence of ischemia.  given her history of CVA MRI was ordered to rule out stroke.  With regard to NVD, sxs seem to have resolved. patient has not vomited for about 1 week.  She has had no episodes of vomiting or diarrhea while in the ED. her electrolytes are within normal limits.  She has normal kidney and hepatic function.  She has no leukocytosis.  Her lipase was normal.  Her abdominal exam is benign.  Her UA did show leukocytes and she was complaining of dysuria so will treat for UTI and send urine culture.  With regard to chest pain, her chest pain is reproducible on exam she also has tenderness along the borders of the left scapula which also reproduces her pain.  She has had 2 negative troponins today.  Her chest x-ray is negative for pneumonia or pneumothorax.  She has no evidence of widened mediastinum to suggest AAA or dissection.  Her pain seems to be musculoskeletal in nature.  Do not suspect ACS or PE at this time.  Also suspect that her bradycardia is chronic and could be medication induced given that she is on metoprolol.  She is asymptomatic she has no evidence of a block on ECG.  Her initial ECG had heart rate 85 and sinus rhythm with ventricular bigeminy and minimal ST depression in the inferior leads.  No evidence of STEMI.  Repeat ECG with bradycardia normal sinus rhythm, minimal ST depression, and resolved bigeminy.  Patient has remained stable in the ED today, her MRI is currently pending if the  results of her MRI are negative I feel that she is safe for discharge with close outpatient follow-up.  Have provided information to establish primary care and discharge instructions as as well as given strict return precautions for any new or worsening symptoms.  Patient care signed out to Lyndel Safe, PA-C with plan to follow-up on results of MRI and discharge patient if negative.   ED Discharge Orders        Ordered    nitrofurantoin, macrocrystal-monohydrate, (MACROBID) 100 MG capsule  2 times daily     03/23/18 1613       Keyon Liller S, PA-C 03/23/18 1637

## 2018-03-24 LAB — URINE CULTURE

## 2018-03-24 MED ORDER — ACETAMINOPHEN 325 MG PO TABS
650.0000 mg | ORAL_TABLET | Freq: Once | ORAL | Status: AC
  Administered 2018-03-24: 650 mg via ORAL
  Filled 2018-03-24: qty 2

## 2018-03-24 MED ORDER — PANTOPRAZOLE SODIUM 40 MG PO TBEC
40.0000 mg | DELAYED_RELEASE_TABLET | Freq: Every day | ORAL | Status: DC
  Administered 2018-03-24: 40 mg via ORAL
  Filled 2018-03-24 (×2): qty 1

## 2018-03-24 MED ORDER — BUSPIRONE HCL 15 MG PO TABS
7.5000 mg | ORAL_TABLET | Freq: Two times a day (BID) | ORAL | Status: DC
  Administered 2018-03-24 (×2): 7.5 mg via ORAL
  Filled 2018-03-24 (×2): qty 1

## 2018-03-24 MED ORDER — GABAPENTIN 300 MG PO CAPS
300.0000 mg | ORAL_CAPSULE | Freq: Three times a day (TID) | ORAL | Status: DC
  Administered 2018-03-24: 300 mg via ORAL
  Filled 2018-03-24 (×2): qty 1

## 2018-03-24 NOTE — Progress Notes (Signed)
CSW spoke with pt at bedside and was informed that pt was recently taken out of a SNF [place] and came to [place] with son. CSW spoke with pt and pt declined wanting to go back to a SNF at this time. [facility] and [facility]. Pt expressed being agreeable to to this at this time. CSW reached out to pt's son Janyth Pupaicholas and confirmed that someone is at [facility] pt and family has been staying. CSW has spoken with RN and confirmed that she will set up [facility] to take pt back to address give. At this time there are no further CSW needs. CSW signing off.     Claude MangesKierra S. Kingsley Herandez, MSW, LCSW-A Emergency Department Clinical Social Worker [phone]

## 2018-03-24 NOTE — ED Notes (Signed)
Medical necessity for transport completed

## 2018-03-24 NOTE — ED Notes (Signed)
Patient verbalizes understanding of discharge instructions. Opportunity for questioning and answers were provided. Armband removed by staff, pt discharged from ED via [facility].  

## 2018-03-24 NOTE — ED Notes (Signed)
[facility] notified for transport to [address] ([place])

## 2018-03-25 ENCOUNTER — Other Ambulatory Visit: Payer: Self-pay | Admitting: *Deleted

## 2018-03-25 DIAGNOSIS — R531 Weakness: Secondary | ICD-10-CM

## 2018-04-01 ENCOUNTER — Emergency Department (HOSPITAL_COMMUNITY): Payer: Medicare Other

## 2018-04-01 ENCOUNTER — Other Ambulatory Visit: Payer: Self-pay

## 2018-04-01 ENCOUNTER — Encounter (HOSPITAL_COMMUNITY): Payer: Self-pay | Admitting: Emergency Medicine

## 2018-04-01 ENCOUNTER — Inpatient Hospital Stay (HOSPITAL_COMMUNITY)
Admission: EM | Admit: 2018-04-01 | Discharge: 2018-04-06 | DRG: 303 | Disposition: A | Discharge status: Home Health | Payer: Medicare Other | Attending: Internal Medicine | Admitting: Internal Medicine

## 2018-04-01 DIAGNOSIS — I70298 Other atherosclerosis of native arteries of extremities, other extremity: Secondary | ICD-10-CM | POA: Diagnosis present

## 2018-04-01 DIAGNOSIS — E785 Hyperlipidemia, unspecified: Secondary | ICD-10-CM | POA: Diagnosis not present

## 2018-04-01 DIAGNOSIS — Z880 Allergy status to penicillin: Secondary | ICD-10-CM

## 2018-04-01 DIAGNOSIS — I25110 Atherosclerotic heart disease of native coronary artery with unstable angina pectoris: Secondary | ICD-10-CM | POA: Diagnosis not present

## 2018-04-01 DIAGNOSIS — I208 Other forms of angina pectoris: Secondary | ICD-10-CM | POA: Diagnosis present

## 2018-04-01 DIAGNOSIS — Z681 Body mass index (BMI) 19 or less, adult: Secondary | ICD-10-CM

## 2018-04-01 DIAGNOSIS — F17200 Nicotine dependence, unspecified, uncomplicated: Secondary | ICD-10-CM | POA: Diagnosis present

## 2018-04-01 DIAGNOSIS — Z9114 Patient's other noncompliance with medication regimen: Secondary | ICD-10-CM

## 2018-04-01 DIAGNOSIS — I69351 Hemiplegia and hemiparesis following cerebral infarction affecting right dominant side: Secondary | ICD-10-CM

## 2018-04-01 DIAGNOSIS — I200 Unstable angina: Secondary | ICD-10-CM | POA: Diagnosis present

## 2018-04-01 DIAGNOSIS — Z9181 History of falling: Secondary | ICD-10-CM

## 2018-04-01 DIAGNOSIS — I2089 Other forms of angina pectoris: Secondary | ICD-10-CM | POA: Diagnosis present

## 2018-04-01 DIAGNOSIS — Z8249 Family history of ischemic heart disease and other diseases of the circulatory system: Secondary | ICD-10-CM

## 2018-04-01 DIAGNOSIS — R072 Precordial pain: Secondary | ICD-10-CM | POA: Diagnosis not present

## 2018-04-01 DIAGNOSIS — Z8673 Personal history of transient ischemic attack (TIA), and cerebral infarction without residual deficits: Secondary | ICD-10-CM | POA: Diagnosis not present

## 2018-04-01 DIAGNOSIS — I70203 Unspecified atherosclerosis of native arteries of extremities, bilateral legs: Secondary | ICD-10-CM | POA: Diagnosis present

## 2018-04-01 DIAGNOSIS — I959 Hypotension, unspecified: Secondary | ICD-10-CM | POA: Diagnosis present

## 2018-04-01 DIAGNOSIS — I700 Atherosclerosis of aorta: Secondary | ICD-10-CM | POA: Diagnosis present

## 2018-04-01 DIAGNOSIS — N390 Urinary tract infection, site not specified: Secondary | ICD-10-CM | POA: Diagnosis present

## 2018-04-01 DIAGNOSIS — Z9851 Tubal ligation status: Secondary | ICD-10-CM

## 2018-04-01 DIAGNOSIS — R64 Cachexia: Secondary | ICD-10-CM | POA: Diagnosis present

## 2018-04-01 DIAGNOSIS — H269 Unspecified cataract: Secondary | ICD-10-CM | POA: Diagnosis present

## 2018-04-01 DIAGNOSIS — I493 Ventricular premature depolarization: Secondary | ICD-10-CM | POA: Diagnosis present

## 2018-04-01 HISTORY — DX: Peripheral vascular disease, unspecified: I73.9

## 2018-04-01 HISTORY — DX: Disorder of arteries and arterioles, unspecified: I77.9

## 2018-04-01 HISTORY — DX: Personal history of nicotine dependence: Z87.891

## 2018-04-01 HISTORY — DX: Unspecified visual loss: H54.7

## 2018-04-01 HISTORY — DX: Conduction disorder, unspecified: I45.9

## 2018-04-01 HISTORY — DX: Hyperlipidemia, unspecified: E78.5

## 2018-04-01 HISTORY — DX: Unspecified cataract: H26.9

## 2018-04-01 HISTORY — DX: Bradycardia, unspecified: R00.1

## 2018-04-01 HISTORY — DX: Atherosclerotic heart disease of native coronary artery without angina pectoris: I25.10

## 2018-04-01 LAB — URINALYSIS, ROUTINE W REFLEX MICROSCOPIC
BILIRUBIN URINE: NEGATIVE
GLUCOSE, UA: NEGATIVE mg/dL
Ketones, ur: 20 mg/dL — AB
NITRITE: NEGATIVE
Protein, ur: NEGATIVE mg/dL
SPECIFIC GRAVITY, URINE: 1.026 (ref 1.005–1.030)
pH: 5 (ref 5.0–8.0)

## 2018-04-01 LAB — CBC
HEMATOCRIT: 42.7 % (ref 36.0–46.0)
HEMOGLOBIN: 14.1 g/dL (ref 12.0–15.0)
MCH: 32.6 pg (ref 26.0–34.0)
MCHC: 33 g/dL (ref 30.0–36.0)
MCV: 98.6 fL (ref 78.0–100.0)
Platelets: 244 10*3/uL (ref 150–400)
RBC: 4.33 MIL/uL (ref 3.87–5.11)
RDW: 12.5 % (ref 11.5–15.5)
WBC: 8.4 10*3/uL (ref 4.0–10.5)

## 2018-04-01 LAB — BASIC METABOLIC PANEL
ANION GAP: 12 (ref 5–15)
BUN: 13 mg/dL (ref 6–20)
CO2: 23 mmol/L (ref 22–32)
Calcium: 9.4 mg/dL (ref 8.9–10.3)
Chloride: 107 mmol/L (ref 101–111)
Creatinine, Ser: 0.8 mg/dL (ref 0.44–1.00)
GFR calc Af Amer: >60 mL/min (ref >60)
Glucose, Bld: 105 mg/dL — ABNORMAL HIGH (ref 65–99)
POTASSIUM: 3.6 mmol/L (ref 3.5–5.1)
SODIUM: 142 mmol/L (ref 135–145)

## 2018-04-01 LAB — HEPATIC FUNCTION PANEL
ALT: 14 U/L (ref 14–54)
AST: 18 U/L (ref 15–41)
Albumin: 3.7 g/dL (ref 3.5–5.0)
Alkaline Phosphatase: 118 U/L (ref 38–126)
Bilirubin, Direct: <0.1 mg/dL — ABNORMAL LOW (ref 0.1–0.5)
TOTAL PROTEIN: 7.1 g/dL (ref 6.5–8.1)
Total Bilirubin: 0.9 mg/dL (ref 0.3–1.2)

## 2018-04-01 LAB — I-STAT TROPONIN, ED: TROPONIN I, POC: 0 ng/mL (ref 0.00–0.08)

## 2018-04-01 LAB — LIPASE, BLOOD: Lipase: 29 U/L (ref 11–51)

## 2018-04-01 LAB — D-DIMER, QUANTITATIVE (NOT AT ARMC): D DIMER QUANT: 0.9 ug{FEU}/mL — AB (ref 0.00–0.50)

## 2018-04-01 MED ORDER — ALPRAZOLAM 0.25 MG PO TABS: 0.2500 mg | ORAL_TABLET | Freq: Two times a day (BID) | ORAL | Status: DC | PRN

## 2018-04-01 MED ORDER — IOPAMIDOL (ISOVUE-370) INJECTION 76%
100.0000 mL | Freq: Once | INTRAVENOUS | Status: AC | PRN
  Administered 2018-04-01: 100 mL via INTRAVENOUS

## 2018-04-01 MED ORDER — ACETAMINOPHEN 325 MG PO TABS
650.0000 mg | ORAL_TABLET | ORAL | Status: DC | PRN
  Administered 2018-04-04 – 2018-04-06 (×5): 650 mg via ORAL
  Filled 2018-04-01 (×5): qty 2

## 2018-04-01 MED ORDER — IOPAMIDOL (ISOVUE-370) INJECTION 76%
INTRAVENOUS | Status: AC
  Filled 2018-04-01: qty 100

## 2018-04-01 MED ORDER — ONDANSETRON HCL 4 MG/2ML IJ SOLN
4.0000 mg | Freq: Four times a day (QID) | INTRAMUSCULAR | Status: DC | PRN
  Administered 2018-04-02 (×2): 4 mg via INTRAVENOUS
  Filled 2018-04-01 (×2): qty 2

## 2018-04-01 MED ORDER — GI COCKTAIL ~~LOC~~
30.0000 mL | Freq: Four times a day (QID) | ORAL | Status: DC | PRN
  Administered 2018-04-02: 30 mL via ORAL
  Filled 2018-04-01: qty 30

## 2018-04-01 MED ORDER — FENTANYL CITRATE (PF) 100 MCG/2ML IJ SOLN
50.0000 ug | Freq: Once | INTRAMUSCULAR | Status: AC
Start: 2018-04-01 — End: 2018-04-01
  Administered 2018-04-01: 50 ug via INTRAVENOUS
  Filled 2018-04-01: qty 2

## 2018-04-01 MED ORDER — MORPHINE SULFATE (PF) 4 MG/ML IV SOLN
2.0000 mg | INTRAVENOUS | Status: DC | PRN
  Administered 2018-04-02 – 2018-04-06 (×9): 2 mg via INTRAVENOUS
  Filled 2018-04-01 (×9): qty 1

## 2018-04-01 MED ORDER — ENOXAPARIN SODIUM 40 MG/0.4ML ~~LOC~~ SOLN
40.0000 mg | Freq: Every day | SUBCUTANEOUS | Status: DC
  Administered 2018-04-02 – 2018-04-03 (×2): 40 mg via SUBCUTANEOUS
  Filled 2018-04-01 (×2): qty 0.4

## 2018-04-01 NOTE — ED Notes (Signed)
ED Provider at bedside. 

## 2018-04-01 NOTE — ED Triage Notes (Signed)
Arrived [facility] for c/o Chest pain (pressure) radiating to left arm and back. hx of angina, murmur, and blindness 12 lead unremarkable per EMS 324 ASA given BP 136/82 P76 97%RA

## 2018-04-01 NOTE — H&P (Signed)
History and Physical    Mary FitzSharon Topping ONG:[id number] DOB: 10-Nov-1951 DOA: 04/01/2018  Referring MD/NP/PA: Dr. Lynden Oxfordhristopher Tegeler PCP: Patient, No Pcp Per  Patient coming from: Home via EMS  Chief Complaint: Chest Pain  I have personally briefly reviewed patient's old medical records in [facility]   HPI: Mary Nguyen is a 67 y.o. female with medical history significant of CVA with residual right-sided deficit, CAD, and heart murmur; who presents with complaints of left-sided chest pain that felt like pressure with radiation to her left arm and back while at rest.  Patient notes associated symptoms of nausea, reports at least one episode of vomiting yesterday, mild abdominal pain, loose stool, and some mild shortness of breath.  She notes having similar symptoms like this 1 week ago when she reports having diarrhea, and makes note that her pulse was in the 30s to 40s.  She was seen in the ED for symptoms, and diagnosed with a UTI.  Patient was also found to be negative for C. difficile and the urine culture collected revealed multiple species.  She was ultimately sent home with Macrobid which the patient reports taking without relief of dysuria symptoms.  At baseline patient lives with her son who is her primary caregiver and she reports needing assistance to ambulate.  Denies having any shortness of breath or diaphoresis.  She reports last having a fall 3 weeks ago where she fell into the toilet seat.   324 mg of aspirin had been given en route with EMS.  ED Course: On admission to the emergency department patient was noted to be afebrile, pulse 44-1 11, respirations 17-29, blood pressure 117/55-141/52, and O2 saturation 94-100% on room air.  Labs revealed normal CBC, CMP, and initial troponin.  D-dimer was noted to be elevated at 0.9.  CT angiogram was performed but negative for any signs of a pulmonary embolus and noted chronic appearing T3/T7 compression fractures.  Blood pressures  were noted to be initially saw patient was given fentanyl with resolution of pain symptoms.  Patient denies any shortness of breath symptoms at this time.  Review of Systems  Constitutional: Positive for malaise/fatigue. Negative for chills, diaphoresis and fever.  HENT: Negative for ear discharge and nosebleeds.   Eyes:       Positive for patient being legally blind  Respiratory: Negative for sputum production and shortness of breath.   Cardiovascular: Positive for chest pain. Negative for leg swelling.  Gastrointestinal: Positive for abdominal pain, diarrhea, nausea and vomiting.  Genitourinary: Positive for dysuria. Negative for hematuria.  Musculoskeletal: Negative for falls.  Skin: Negative for itching and rash.  Neurological: Positive for dizziness and focal weakness. Negative for seizures and loss of consciousness.  Endo/Heme/Allergies: Negative for environmental allergies and polydipsia.  Psychiatric/Behavioral: Negative for hallucinations and substance abuse.    Past Medical History:  Diagnosis Date  . Heart murmur   . Stroke [facility](HCC)     Past Surgical History:  Procedure Laterality Date  . TUBAL LIGATION       reports that she has been smoking.  She has never used smokeless tobacco. Her alcohol and drug histories are not on file.  Allergies  Allergen Reactions  . Penicillins Shortness Of Breath and Swelling    No family history on file.  Prior to Admission medications   Medication Sig Start Date End Date Taking? Authorizing Provider  gabapentin (NEURONTIN) 300 MG capsule Take 1 capsule (300 mg total) by mouth 3 (three) times daily for 7 days. Patient  not taking: Reported on 04/01/2018 03/23/18 03/30/18  Cristina Gong, PA-C  omeprazole (PRILOSEC) 20 MG capsule Take 1 capsule (20 mg total) by mouth 2 (two) times daily before a meal for 7 days. Patient not taking: Reported on 04/01/2018 03/23/18 03/30/18  Cristina Gong, PA-C    Physical  Exam:  Constitutional: Elderly female who appears older than stated age in no acute distress at this time Vitals:   04/01/18 1830 04/01/18 2045 04/01/18 2215 04/01/18 2230  BP: (!) 141/52 (!) 130/48 (!) 122/53 (!) 117/55  Pulse:  (!) 44 (!) 108 (!) 111  Resp: 18 (!) 23 (!) 22 17  Temp:      TempSrc:      SpO2:  96% 94% 95%  Weight:      Height:       Eyes: Right eye cataract present ENMT: Mucous membranes are moist. Posterior pharynx clear of any exudate or lesions..  Neck: normal, supple, no masses, no thyromegaly Respiratory: clear to auscultation bilaterally, no wheezing, no crackles. Normal respiratory effort. No accessory muscle use.  Cardiovascular: Sinus bradycardia noted with intermittent PVCs.  Positive systolic ejection murmur present to 2/ 6. no lower extremity edema appreciated.  Abdomen:mild epigastric tenderness, no masses palpated. No hepatosplenomegaly. Bowel sounds positive.  Musculoskeletal: no clubbing / cyanosis. No joint deformity upper and lower extremities. Good ROM, no contractures. Normal muscle tone.  Skin: no rashes, lesions, ulcers. No induration Neurologic: CN 2-12 grossly intact.  right-sided weakness Psychiatric: Normal judgment and insight. Alert and oriented x 3. Normal mood.     Labs on Admission: I have personally reviewed following labs and imaging studies  CBC: Recent Labs  Lab 04/01/18 1748  WBC 8.4  HGB 14.1  HCT 42.7  MCV 98.6  PLT 244   Basic Metabolic Panel: Recent Labs  Lab 04/01/18 1748  NA 142  K 3.6  CL 107  CO2 23  GLUCOSE 105*  BUN 13  CREATININE 0.80  CALCIUM 9.4   GFR: Estimated Creatinine Clearance: 55.2 mL/min (by C-G formula based on SCr of 0.8 mg/dL). Liver Function Tests: Recent Labs  Lab 04/01/18 1748  AST 18  ALT 14  ALKPHOS 118  BILITOT 0.9  PROT 7.1  ALBUMIN 3.7   Recent Labs  Lab 04/01/18 1748  LIPASE 29   No results for input(s): AMMONIA in the last 168 hours. Coagulation Profile: No  results for input(s): INR, PROTIME in the last 168 hours. Cardiac Enzymes: No results for input(s): CKTOTAL, CKMB, CKMBINDEX, TROPONINI in the last 168 hours. BNP (last 3 results) No results for input(s): PROBNP in the last 8760 hours. HbA1C: No results for input(s): HGBA1C in the last 72 hours. CBG: No results for input(s): GLUCAP in the last 168 hours. Lipid Profile: No results for input(s): CHOL, HDL, LDLCALC, TRIG, CHOLHDL, LDLDIRECT in the last 72 hours. Thyroid Function Tests: No results for input(s): TSH, T4TOTAL, FREET4, T3FREE, THYROIDAB in the last 72 hours. Anemia Panel: No results for input(s): VITAMINB12, FOLATE, FERRITIN, TIBC, IRON, RETICCTPCT in the last 72 hours. Urine analysis:    Component Value Date/Time   COLORURINE YELLOW 04/01/2018 1927   APPEARANCEUR CLOUDY (A) 04/01/2018 1927   LABSPEC 1.026 04/01/2018 1927   PHURINE 5.0 04/01/2018 1927   GLUCOSEU NEGATIVE 04/01/2018 1927   HGBUR SMALL (A) 04/01/2018 1927   BILIRUBINUR NEGATIVE 04/01/2018 1927   KETONESUR 20 (A) 04/01/2018 1927   PROTEINUR NEGATIVE 04/01/2018 1927   NITRITE NEGATIVE 04/01/2018 1927   LEUKOCYTESUR MODERATE (A) 04/01/2018  1927   Sepsis Labs: Recent Results (from the past 240 hour(s))  Urine culture     Status: Abnormal   Collection Time: 03/23/18  7:45 AM  Result Value Ref Range Status   Specimen Description URINE, CLEAN CATCH  Final   Special Requests   Final    NONE Performed at [facility], [address] [place], [place] [address]    Culture MULTIPLE SPECIES PRESENT, SUGGEST RECOLLECTION (A)  Final   Report Status 03/24/2018 FINAL  Final  C difficile quick scan w PCR reflex     Status: None   Collection Time: 03/23/18  8:10 PM  Result Value Ref Range Status   C Diff antigen NEGATIVE NEGATIVE Final   C Diff toxin NEGATIVE NEGATIVE Final   C Diff interpretation No C. difficile detected.  Final     Radiological Exams on Admission: Dg Chest 2 View  Result Date:  04/01/2018 CLINICAL DATA:  Chest pain radiating to the left arm. EXAM: CHEST - 2 VIEW COMPARISON:  03/23/2018 FINDINGS: Hyperinflated lungs without pulmonary consolidation or CHF. Heart and mediastinal contours are stable and within normal limits. There is aortic atherosclerosis without aneurysmal dilatation. No effusion or pneumothorax. Redemonstration of upper and lower thoracic compression fractures without significant change. IMPRESSION: Hyperinflated lungs without active pulmonary disease. Aortic atherosclerosis. Likely remote upper and lower thoracic compression fractures. Electronically Signed   By: Tollie Eth M.D.   On: 04/01/2018 18:31   Ct Angio Chest Pe W And/or Wo Contrast  Result Date: 04/01/2018 CLINICAL DATA:  Chest pain radiating to the left arm and back. Positive D-dimer. EXAM: CT ANGIOGRAPHY CHEST WITH CONTRAST TECHNIQUE: Multidetector CT imaging of the chest was performed using the standard protocol during bolus administration of intravenous contrast. Multiplanar CT image reconstructions and MIPs were obtained to evaluate the vascular anatomy. CONTRAST:  ISOVUE-370 IOPAMIDOL (ISOVUE-370) INJECTION 76% COMPARISON:  None. FINDINGS: Cardiovascular: Contrast injection is sufficient to demonstrate satisfactory opacification of the pulmonary arteries to the segmental level. There is no pulmonary embolus. The main pulmonary artery is within normal limits for size. There is moderateaortic atherosclerosis. Heart size is normal, without pericardial effusion. Mediastinum/Nodes: No mediastinal, hilar or axillary lymphadenopathy. The visualized thyroid and thoracic esophageal course are unremarkable. Lungs/Pleura: No pulmonary nodules or masses. No pleural effusion or pneumothorax. No focal airspace consolidation. No focal pleural abnormality. Upper Abdomen: Contrast bolus timing is not optimized for evaluation of the abdominal organs. Within this limitation, the visualized organs of the upper  abdomen are normal. Musculoskeletal: T3 and T7 compression deformities are likely chronic. Review of the MIP images confirms the above findings. IMPRESSION: 1. No pulmonary embolus or other acute thoracic abnormality. 2. Compression deformities at T3 and T7 are likely chronic. 3.  Aortic Atherosclerosis (ICD10-I70.0). Electronically Signed   By: Deatra Robinson M.D.   On: 04/01/2018 21:50    EKG: Independently reviewed.  Sinus rhythm at 65 bpm with multiple PVCs and signs of ST depression  Assessment/Plan Chest pain: Acute.  Elderly female with history of cardiac murmur who presents with left-sided chest pain with radiation.  Initial troponins negative.  D-dimer was found to be elevated, but CT angiogram showed no acute signs of a pulmonary embolus.  Heart score 6. - Admit to telemetry bed - Chest pain order set initiated - Trend cardiac troponins x3 every 3 hours - Nitroglycerin as needed chest pain - GI cocktail prn indigestion - Morphine IV as needed pain - Check echocardiogram in a.m. - Aspirin -  Will need to consult cardiology in a.m.  Bradycardia: Patient reports having intermittent episodes of bradycardia with heart rates into the 30s while awake. - Follow-up telemetry overnight  Urinary tract infect: Acute.  Patient still reports complaints of dysuria.  Previously treated with Macrobid without relief. - Follow-up urine culture - Levaquin IV  CVA with residual right-sided weakness: Stable  Hyperlipidemia:LDL 173 with total cholesterol 227, and HDL 41 on admission. - Recommend starting atorvastatin  DVT prophylaxis: lovenox Code Status: Full Family Communication: Discussed plan of care with the patient and family present at bedside Disposition Plan: Possible discharge home if work-up negative Consults called: none  Admission status: observation  Clydie Braun MD [facility] Pager [phone]   If 7PM-7AM, please contact night-coverage [email or web address] Password  [facility]  04/01/2018, 11:44 PM

## 2018-04-01 NOTE — ED Notes (Signed)
Patient transported to CT 

## 2018-04-01 NOTE — ED Provider Notes (Signed)
[facility] EMERGENCY DEPARTMENT Provider Note   CSN: [id number] Arrival date & time: 04/01/18  1723     History   Chief Complaint Chief Complaint  Patient presents with  . Chest Pain    HPI Mary Nguyen is a 67 y.o. female.  The history is provided by the patient and medical records. No language interpreter was used.  Chest Pain   This is a new problem. The current episode started 6 to 12 hours ago. The problem occurs constantly. The problem has not changed since onset.The pain is present in the substernal region and lateral region. The pain is at a severity of 9/10. The pain is moderate. The quality of the pain is described as sharp and heavy. The pain radiates to the left jaw and left shoulder. Associated symptoms include malaise/fatigue, nausea, palpitations, shortness of breath and vomiting. Pertinent negatives include no abdominal pain, no back pain, no cough, no diaphoresis, no dizziness, no exertional chest pressure, no fever, no headaches, no numbness, no sputum production and no weakness. She has tried nothing for the symptoms. The treatment provided no relief.  Pertinent negatives for past medical history include no seizures.    Past Medical History:  Diagnosis Date  . Heart murmur   . Stroke [facility])     There are no active problems to display for this patient.   Past Surgical History:  Procedure Laterality Date  . TUBAL LIGATION       OB History   None      Home Medications    Prior to Admission medications   Medication Sig Start Date End Date Taking? Authorizing Provider  gabapentin (NEURONTIN) 300 MG capsule Take 1 capsule (300 mg total) by mouth 3 (three) times daily for 7 days. 03/23/18 03/30/18  Cristina Gong, PA-C  metoprolol tartrate (LOPRESSOR) 25 MG tablet Take 25 mg by mouth 2 (two) times daily.    [provider]  omeprazole (PRILOSEC) 20 MG capsule Take 1 capsule (20 mg total) by mouth 2 (two) times daily  before a meal for 7 days. 03/23/18 03/30/18  Cristina Gong, PA-C    Family History No family history on file.  Social History Social History   Tobacco Use  . Smoking status: Current Every Day Smoker  . Smokeless tobacco: Never Used  Substance Use Topics  . Alcohol use: Not on file  . Drug use: Not on file     Allergies   Penicillins   Review of Systems Review of Systems  Constitutional: Positive for fatigue and malaise/fatigue. Negative for chills, diaphoresis and fever.  HENT: Negative for congestion.   Respiratory: Positive for shortness of breath. Negative for cough, sputum production, chest tightness, wheezing and stridor.   Cardiovascular: Positive for chest pain and palpitations. Negative for leg swelling.  Gastrointestinal: Positive for nausea and vomiting. Negative for abdominal pain, constipation and diarrhea.  Genitourinary: Negative for dysuria.  Musculoskeletal: Negative for back pain, neck pain and neck stiffness.  Skin: Negative for rash and wound.  Neurological: Positive for light-headedness. Negative for dizziness, seizures, weakness, numbness and headaches.  Psychiatric/Behavioral: Negative for agitation.  All other systems reviewed and are negative.    Physical Exam Updated Vital Signs BP 127/60 (BP Location: Right Arm)   Pulse 74   Temp 97.9 F (36.6 C) (Oral)   Resp 17   Ht 5\' 2"  (1.575 m)   Wt 49.9 kg (110 lb)   SpO2 100%   BMI 20.12 kg/m   Physical  Exam  Constitutional: She appears well-developed and well-nourished.  Non-toxic appearance. She does not appear ill. No distress.  HENT:  Head: Normocephalic and atraumatic.  Mouth/Throat: Oropharynx is clear and moist. No oropharyngeal exudate.  Eyes: No scleral icterus.  Pt is blind   Neck: Normal range of motion. Neck supple.  Cardiovascular: Normal rate, regular rhythm and intact distal pulses.  Murmur heard. Pulmonary/Chest: Effort normal and breath sounds normal. No respiratory  distress. She has no wheezes. She has no rales. She exhibits no tenderness.  Abdominal: Soft. There is no tenderness. There is no rebound.  Musculoskeletal: She exhibits no edema or tenderness.  Neurological: She is alert. No sensory deficit. She exhibits abnormal muscle tone (baseline weaknessin R side).  Skin: Skin is warm and dry. Capillary refill takes less than 2 seconds. No rash noted.  Psychiatric: She has a normal mood and affect.  Nursing note and vitals reviewed.    ED Treatments / Results  Labs (all labs ordered are listed, but only abnormal results are displayed) Labs Reviewed  BASIC METABOLIC PANEL - Abnormal; Notable for the following components:      Result Value   Glucose, Bld 105 (*)    All other components within normal limits  HEPATIC FUNCTION PANEL - Abnormal; Notable for the following components:   Bilirubin, Direct <0.1 (*)    All other components within normal limits  URINALYSIS, ROUTINE W REFLEX MICROSCOPIC - Abnormal; Notable for the following components:   APPearance CLOUDY (*)    Hgb urine dipstick SMALL (*)    Ketones, ur 20 (*)    Leukocytes, UA MODERATE (*)    Bacteria, UA FEW (*)    All other components within normal limits  D-DIMER, QUANTITATIVE (NOT AT [facility]) - Abnormal; Notable for the following components:   D-Dimer, Quant 0.90 (*)    All other components within normal limits  URINE CULTURE  CBC  LIPASE, BLOOD  I-STAT TROPONIN, ED    EKG EKG Interpretation  Date/Time:  Friday April 01 2018 17:29:44 EDT Ventricular Rate:  65 PR Interval:    QRS Duration: 97 QT Interval:  456 QTC Calculation: 475 R Axis:   73 Text Interpretation:  Sinus rhythm Multiple premature complexes, vent & supraven Minimal ST depression, inferior leads When compared to prior, no significant changes seen.  No STEMI Confirmed by Theda Belfastegeler, Chris ([id number]) on 04/01/2018 5:32:08 PM   Radiology Dg Chest 2 View  Result Date: 04/01/2018 CLINICAL DATA:  Chest pain  radiating to the left arm. EXAM: CHEST - 2 VIEW COMPARISON:  03/23/2018 FINDINGS: Hyperinflated lungs without pulmonary consolidation or CHF. Heart and mediastinal contours are stable and within normal limits. There is aortic atherosclerosis without aneurysmal dilatation. No effusion or pneumothorax. Redemonstration of upper and lower thoracic compression fractures without significant change. IMPRESSION: Hyperinflated lungs without active pulmonary disease. Aortic atherosclerosis. Likely remote upper and lower thoracic compression fractures. Electronically Signed   By: Tollie Ethavid  Kwon M.D.   On: 04/01/2018 18:31   Ct Angio Chest Pe W And/or Wo Contrast  Result Date: 04/01/2018 CLINICAL DATA:  Chest pain radiating to the left arm and back. Positive D-dimer. EXAM: CT ANGIOGRAPHY CHEST WITH CONTRAST TECHNIQUE: Multidetector CT imaging of the chest was performed using the standard protocol during bolus administration of intravenous contrast. Multiplanar CT image reconstructions and MIPs were obtained to evaluate the vascular anatomy. CONTRAST:  100mL ISOVUE-370 IOPAMIDOL (ISOVUE-370) INJECTION 76% COMPARISON:  None. FINDINGS: Cardiovascular: Contrast injection is sufficient to demonstrate satisfactory opacification of the pulmonary  arteries to the segmental level. There is no pulmonary embolus. The main pulmonary artery is within normal limits for size. There is moderateaortic atherosclerosis. Heart size is normal, without pericardial effusion. Mediastinum/Nodes: No mediastinal, hilar or axillary lymphadenopathy. The visualized thyroid and thoracic esophageal course are unremarkable. Lungs/Pleura: No pulmonary nodules or masses. No pleural effusion or pneumothorax. No focal airspace consolidation. No focal pleural abnormality. Upper Abdomen: Contrast bolus timing is not optimized for evaluation of the abdominal organs. Within this limitation, the visualized organs of the upper abdomen are normal. Musculoskeletal: T3 and  T7 compression deformities are likely chronic. Review of the MIP images confirms the above findings. IMPRESSION: 1. No pulmonary embolus or other acute thoracic abnormality. 2. Compression deformities at T3 and T7 are likely chronic. 3.  Aortic Atherosclerosis (ICD10-I70.0). Electronically Signed   By: Deatra Robinson M.D.   On: 04/01/2018 21:50    Procedures Procedures (including critical care time)  Medications Ordered in ED Medications  iopamidol (ISOVUE-370) 76 % injection (has no administration in time range)  iopamidol (ISOVUE-370) 76 % injection 100 mL (100 mLs Intravenous Contrast Given 04/01/18 2120)  fentaNYL (SUBLIMAZE) injection 50 mcg (50 mcg Intravenous Given 04/01/18 2245)     Initial Impression / Assessment and Plan / ED Course  I have reviewed the triage vital signs and the nursing notes.  Pertinent labs & imaging results that were available during my care of the patient were reviewed by me and considered in my medical decision making (see chart for details).     Luellen Howson is a 67 y.o. female with a past medical history significant for prior stroke with residual right-sided deficits, cardiac murmur, and recent urinary tract infection who presents with recurrent chest pain, nausea, vomiting, diarrhea, and dysuria.  Patient reports that last week she was diagnosed with a urinary tract infection.  She reports that she took her medications but is still having dysuria.  She reports that she has developed chest pain today.  She reports that she did have chest pain last week but it was brief and had resolved until today.  She reports that it is a sharp and pressure pain in her left chest.  It radiates into her left arm and left shoulder.  She reports associated nausea, vomiting, lightheadedness, and palpitations.  She reports associated shortness of breath.  She denies recent trauma.  She denies any abdominal pain, back pain, or neck pain.  She denies fevers, chills, cough.  She  denies any constipation.  On exam, chest is tender in the left chest.  Abdomen is nontender.  Back is nontender.  Lungs had crackles in the bases bilaterally.  Legs were not edematous.  Patient had weakness on the right side which she reports is unchanged from prior.  EKG revealed sinus rhythm with an occasional premature complex.  No STEMI.  Patient work-up to look for etiology of her chest pain.  Heart score calculated as a 6.  Patient received aspirin with EMS.  Patient currently has mild pain.  11:14 PM Patient's diagnostic testing results are seen above.  Patient found to have negative troponin.  D-dimer was elevated.  PE study was ordered showing no evidence of pulmonary embolism.  Urinalysis does show moderate leukocytes and bacteria however there is no nitrites.  Culture was sent   To determine if patient still has UTI.  CBC and BMP with hepatic function reassuring.  Lipase not elevated.  On reassessment, patient was still having chest pain.  Her blood pressure was  in the low 100 range so decision made to use fentanyl instead of nitroglycerin.  Patient's pain improved to a 7 out of 10 from the 9 out of 10 it was when I assessed her.  Given the patient's continued chest pain with a heart score of 6, patient will be called for admission for further chest pain evaluation and management.    Final Clinical Impressions(s) / ED Diagnoses   Final diagnoses:  Precordial pain     Clinical Impression: 1. Precordial pain     Disposition: Admit  This note was prepared with assistance of Dragon voice recognition software. Occasional wrong-word or sound-a-like substitutions may have occurred due to the inherent limitations of voice recognition software.       Biran Mayberry, Canary Brim, MD 04/02/18 [phone]

## 2018-04-02 ENCOUNTER — Other Ambulatory Visit: Payer: Self-pay

## 2018-04-02 ENCOUNTER — Encounter (HOSPITAL_COMMUNITY): Payer: Self-pay | Admitting: Physician Assistant

## 2018-04-02 ENCOUNTER — Observation Stay (HOSPITAL_COMMUNITY): Payer: Medicare Other

## 2018-04-02 DIAGNOSIS — I200 Unstable angina: Secondary | ICD-10-CM | POA: Diagnosis not present

## 2018-04-02 DIAGNOSIS — I739 Peripheral vascular disease, unspecified: Secondary | ICD-10-CM | POA: Diagnosis not present

## 2018-04-02 DIAGNOSIS — E785 Hyperlipidemia, unspecified: Secondary | ICD-10-CM | POA: Diagnosis not present

## 2018-04-02 DIAGNOSIS — R079 Chest pain, unspecified: Secondary | ICD-10-CM | POA: Diagnosis not present

## 2018-04-02 DIAGNOSIS — Z8673 Personal history of transient ischemic attack (TIA), and cerebral infarction without residual deficits: Secondary | ICD-10-CM

## 2018-04-02 LAB — CBC WITH DIFFERENTIAL/PLATELET
BASOS PCT: 0 %
Basophils Absolute: 0 10*3/uL (ref 0.0–0.1)
EOS PCT: 3 %
Eosinophils Absolute: 0.2 10*3/uL (ref 0.0–0.7)
HEMATOCRIT: 40.9 % (ref 36.0–46.0)
Hemoglobin: 13.5 g/dL (ref 12.0–15.0)
Lymphocytes Relative: 43 %
Lymphs Abs: 3.4 10*3/uL (ref 0.7–4.0)
MCH: 32.6 pg (ref 26.0–34.0)
MCHC: 33 g/dL (ref 30.0–36.0)
MCV: 98.8 fL (ref 78.0–100.0)
MONO ABS: 0.8 10*3/uL (ref 0.1–1.0)
MONOS PCT: 10 %
Neutro Abs: 3.4 10*3/uL (ref 1.7–7.7)
Neutrophils Relative %: 44 %
PLATELETS: 238 10*3/uL (ref 150–400)
RBC: 4.14 MIL/uL (ref 3.87–5.11)
RDW: 12.6 % (ref 11.5–15.5)
WBC: 7.8 10*3/uL (ref 4.0–10.5)

## 2018-04-02 LAB — BASIC METABOLIC PANEL
Anion gap: 9 (ref 5–15)
BUN: 14 mg/dL (ref 6–20)
CALCIUM: 9.1 mg/dL (ref 8.9–10.3)
CO2: 24 mmol/L (ref 22–32)
CREATININE: 0.72 mg/dL (ref 0.44–1.00)
Chloride: 106 mmol/L (ref 101–111)
GFR calc non Af Amer: >60 mL/min (ref >60)
Glucose, Bld: 102 mg/dL — ABNORMAL HIGH (ref 65–99)
Potassium: 3.7 mmol/L (ref 3.5–5.1)
SODIUM: 139 mmol/L (ref 135–145)

## 2018-04-02 LAB — LIPID PANEL
Cholesterol: 227 mg/dL — ABNORMAL HIGH (ref 0–200)
HDL: 41 mg/dL (ref >40)
LDL CALC: 173 mg/dL — AB (ref 0–99)
Total CHOL/HDL Ratio: 5.5 RATIO
Triglycerides: 65 mg/dL (ref <150)
VLDL: 13 mg/dL (ref 0–40)

## 2018-04-02 LAB — HIV ANTIBODY (ROUTINE TESTING W REFLEX): HIV Screen 4th Generation wRfx: NONREACTIVE

## 2018-04-02 LAB — TROPONIN I
Troponin I: <0.03 ng/mL (ref <0.03)
Troponin I: <0.03 ng/mL (ref <0.03)

## 2018-04-02 LAB — ECHOCARDIOGRAM COMPLETE
HEIGHTINCHES: 62 in
WEIGHTICAEL: 1488.55 [oz_av]

## 2018-04-02 LAB — TSH: TSH: 1.754 u[IU]/mL (ref 0.350–4.500)

## 2018-04-02 MED ORDER — ASPIRIN EC 81 MG PO TBEC
81.0000 mg | DELAYED_RELEASE_TABLET | Freq: Every day | ORAL | Status: DC
  Administered 2018-04-02 – 2018-04-06 (×4): 81 mg via ORAL
  Filled 2018-04-02 (×5): qty 1

## 2018-04-02 MED ORDER — ATORVASTATIN CALCIUM 10 MG PO TABS: 10.0000 mg | ORAL_TABLET | Freq: Every day | ORAL | Status: DC

## 2018-04-02 MED ORDER — LEVOFLOXACIN IN D5W 500 MG/100ML IV SOLN
500.0000 mg | INTRAVENOUS | Status: DC
  Administered 2018-04-02: 500 mg via INTRAVENOUS
  Filled 2018-04-02 (×2): qty 100

## 2018-04-02 MED ORDER — ATORVASTATIN CALCIUM 80 MG PO TABS
80.0000 mg | ORAL_TABLET | Freq: Every day | ORAL | Status: DC
  Administered 2018-04-02 – 2018-04-05 (×4): 80 mg via ORAL
  Filled 2018-04-02 (×4): qty 1

## 2018-04-02 MED ORDER — SODIUM CHLORIDE 0.9 % IV BOLUS
500.0000 mL | Freq: Once | INTRAVENOUS | Status: AC | PRN
  Administered 2018-04-02: 500 mL via INTRAVENOUS

## 2018-04-02 MED ORDER — NITROGLYCERIN 0.4 MG SL SUBL
0.4000 mg | SUBLINGUAL_TABLET | SUBLINGUAL | Status: DC | PRN
  Administered 2018-04-02 (×3): 0.4 mg via SUBLINGUAL
  Filled 2018-04-02 (×3): qty 1

## 2018-04-02 MED ORDER — GABAPENTIN 300 MG PO CAPS
300.0000 mg | ORAL_CAPSULE | Freq: Three times a day (TID) | ORAL | Status: DC
  Administered 2018-04-02 – 2018-04-06 (×12): 300 mg via ORAL
  Filled 2018-04-02 (×12): qty 1

## 2018-04-02 MED ORDER — ONDANSETRON HCL 4 MG/2ML IJ SOLN
4.0000 mg | INTRAMUSCULAR | Status: DC | PRN
  Administered 2018-04-03 – 2018-04-05 (×2): 4 mg via INTRAVENOUS
  Filled 2018-04-02 (×3): qty 2

## 2018-04-02 MED ORDER — ATORVASTATIN CALCIUM 40 MG PO TABS: 40.0000 mg | ORAL_TABLET | Freq: Every day | ORAL | Status: DC

## 2018-04-02 MED ORDER — LEVOFLOXACIN 250 MG PO TABS
250.0000 mg | ORAL_TABLET | Freq: Every day | ORAL | Status: AC
  Administered 2018-04-03 – 2018-04-06 (×4): 250 mg via ORAL
  Filled 2018-04-02 (×4): qty 1

## 2018-04-02 NOTE — ED Notes (Signed)
Spoke with Dr. Katrinka Blazing about pt ongoing active chest pain unrelieved with medication. Bed request to be changed to Step Down at this time.

## 2018-04-02 NOTE — Progress Notes (Signed)
During rounds RN reports pt with mid sternum CP radiating to her Left arm, pt admitted for the same. MD made aware, new orders given and completed by RN Misty Stanley. After 2nd nitro sl pt's BP 83/60, 250 m NS bolus given, BP increased to 124/55. Will continue to monitor.

## 2018-04-02 NOTE — Progress Notes (Signed)
  Echocardiogram 2D Echocardiogram has been performed.  Delcie Roch 04/02/2018, 12:33 PM

## 2018-04-02 NOTE — Plan of Care (Signed)
  Problem: Spiritual Needs Goal: Ability to function at adequate level Outcome: Progressing   Problem: Education: Goal: Knowledge of General Education information will improve Outcome: Progressing   Problem: Health Behavior/Discharge Planning: Goal: Ability to manage health-related needs will improve Outcome: Progressing   Problem: Clinical Measurements: Goal: Ability to maintain clinical measurements within normal limits will improve Outcome: Progressing Goal: Will remain free from infection Outcome: Progressing Goal: Diagnostic test results will improve Outcome: Progressing Goal: Cardiovascular complication will be avoided Outcome: Progressing

## 2018-04-02 NOTE — Consult Note (Signed)
Cardiology Consultation:   Patient ID: Mary Nguyen; [id number]; Dec 23, 1950   Admit date: 04/01/2018 Date of Consult: 04/02/2018  Primary Care Provider: Patient, No Pcp Per Primary Cardiologist: New to Dr. Rennis Golden  Chief Complaint: chest pain  Patient Profile:   Mary Nguyen is a 67 y.o. 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 Katrinka Blazing.  History of Present Illness:   Patient is not originally from this area.  She states the back in 2000 she had a stroke in her right eye at which time she had her right neck artery "scraped."  She has had 4 strokes in total.  She reports a history of a severe heart murmur as well as irregular heartbeat and slow heartbeat.  She reports that last year she had her most recent stroke.  Sometime around then, less than a year ago, she underwent a heart catheterization at [facility] in [facility]  She states they had difficulty accessing her leg so had to go into her arm.  She reports they found a blockage in the back of her heart that they treated medically.  She was on Plavix for a period of time after her stroke. She and her family cannot quite remember the timing of these events. Apparently in transition with other medical providers as she moved out of the area, Plavix fell off her list.  The son states a different provider told her she probably did not need it.  She is no longer on any blood thinners, only coming in on omeprazole and gabapentin.  She denies a history of atrial fibrillation, but reports a long history of skipping heartbeats and reportedly saw cardiology for this in the past.  She was at a nursing facility in [place] but was recently discharged as she had graduated from their PT needs.  She and her son moved to [place] about 3 weeks ago.  She was seen in the  emergency department on 03/23/2018 with various symptoms, including left-sided facial droop, slurred speech, left hand paresthesias, left upper extremity pain, left sided chest pain, left upper back pain, and chronic abdominal pain as well as weakness.  Neurologic issues had resolved by the time they arrived to the emergency department.  Her work-up was fairly unremarkable including neurologic imaging only showing old strokes.  She did have evidence for sinus rhythm with frequent PVCs and PACs on varying EKGs, and on one tracing had sinus bradycardia with a heart rate of 41 bpm.  Patient had reported this was chronic and had no acute symptoms with this. She ruled out for MI and was discharged home.  Yesterday, she was eating at [employer] and developed 10 minutes of constant chest pain/pressure radiating to her left arm and back associated with nausea.  EMS was quickly called.  She was given 324 mg of aspirin.  Nitroglycerin was not administered, per patient report, because her IV access had blown.  She states her pain spontaneously resolved by the time she got settled into the emergency department.  Her troponins were negative.  D-dimer was elevated, therefore a CT angios was ordered which did not show any acute aortic pathology or pulmonary embolism.  It did show aortic atherosclerosis.  She had recurrent chest discomfort last night and was given fentanyl as her blood pressure was slightly low.  This improved symptoms.  Late this morning, she  had recurrent chest pain again of the same caliber and received 3 sublingual nitroglycerin in succession.  This dropped her blood pressure and rapid response was called and she was given a saline bolus.  The nitroglycerin did relieve her pain, but it was not right away.  It was a gradual improvement.  She remains chest pain-free at this time.  EKG shows NSR with nonspecific ST-T changes similar to last week with frequent PACs; tele reveals PACs, PVCs, and an episode of sinus  bradycardia down to 38bpm late morning.   Past Medical History:  Diagnosis Date  . Blindness   . Bradycardia    a. pt reports hx of slow HR.  Marland Kitchen CAD in native artery    a. pt reports "blockage in the back of her heart" sometime in 2018 by cath at [facility].  . Carotid artery disease (HCC)    a. pt reports "blockage scraped" R neck artery around 2000.  . Cataract   . Former tobacco use   . Heart murmur   . Hyperlipidemia   . PAD (peripheral artery disease) (HCC)    a. she was told she had poor circulation from the waist down.  . Skipped heart beats   . Stroke [place])    a. 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    Past Surgical History:  Procedure Laterality Date  . TUBAL LIGATION       Inpatient Medications: Scheduled Meds: . aspirin EC  81 mg Oral Daily  . atorvastatin  40 mg Oral Daily  . enoxaparin (LOVENOX) injection  40 mg Subcutaneous Daily   Continuous Infusions: . levofloxacin (LEVAQUIN) IV Stopped (04/02/18 0842)   PRN Meds: acetaminophen, gi cocktail, morphine injection, nitroGLYCERIN, ondansetron (ZOFRAN) IV  Home Meds: Prior to Admission medications   Medication Sig Start Date End Date Taking? Authorizing Provider  gabapentin (NEURONTIN) 300 MG capsule Take 1 capsule (300 mg total) by mouth 3 (three) times daily for 7 days. Patient not taking: Reported on 04/01/2018 03/23/18 03/30/18  Cristina Gong, PA-C  omeprazole (PRILOSEC) 20 MG capsule Take 1 capsule (20 mg total) by mouth 2 (two) times daily before a meal for 7 days. Patient not taking: Reported on 04/01/2018 03/23/18 03/30/18  Cristina Gong, PA-C    Allergies:    Allergies  Allergen Reactions  . Penicillins Shortness Of Breath and Swelling    Social History:   Social History   Socioeconomic History  . Marital status: Divorced    Spouse name: Not on file  . Number of children: Not on file  . Years of  education: Not on file  . Highest education level: Not on file  Occupational History  . Not on file  Social Needs  . Financial resource strain: Not on file  . Food insecurity:    Worry: Not on file    Inability: Not on file  . Transportation needs:    Medical: Not on file    Non-medical: Not on file  Tobacco Use  . Smoking status: Current Every Day Smoker  . Smokeless tobacco: Never Used  . Tobacco comment: Pt reports she recently quit after 45 years but family says she still smokes  Substance and Sexual Activity  . Alcohol use: Not Currently  . Drug use: Not Currently  . Sexual activity: Not on file  Lifestyle  . Physical activity:    Days per  week: Not on file    Minutes per session: Not on file  . Stress: Not on file  Relationships  . Social connections:    Talks on phone: Not on file    Gets together: Not on file    Attends religious service: Not on file    Active member of club or organization: Not on file    Attends meetings of clubs or organizations: Not on file    Relationship status: Not on file  . Intimate partner violence:    Fear of current or ex partner: Not on file    Emotionally abused: Not on file    Physically abused: Not on file    Forced sexual activity: Not on file  Other Topics Concern  . Not on file  Social History Narrative  . Not on file    Family History:   The patient's family history includes Heart disease in her father; Hypertension in her mother.  ROS:  Please see the history of present illness.  + recent UTI dx. All other ROS reviewed and negative.     Physical Exam/Data:   Vitals:   04/02/18 1133 04/02/18 1137 04/02/18 1140 04/02/18 1304  BP:   (!) 106/54 (!) 105/47  Pulse:    (!) 55  Resp: 19 20 (!) 22 17  Temp:    98.5 F (36.9 C)  TempSrc:    Oral  SpO2: 95% 96% 94% 99%  Weight:      Height:       No intake or output data in the 24 hours ending 04/02/18 1341 Filed Weights   04/01/18 1733 04/02/18 0834  Weight: 110 lb  (49.9 kg) 93 lb 0.6 oz (42.2 kg)   Body mass index is 17.02 kg/m.  General: Frail very thin WF, in no acute distress, malodor in room Head: Normocephalic, atraumatic, sclera non-icteric, no xanthomas, nares are without discharge. Neck: R carotid scar. Negative R carotid bruit. Loud L carotid bruit. + R subclavian bruit. JVD not elevated. Lungs: Coarse but bilaterally to auscultation without wheezes, rales, or rhonchi. Breathing is unlabored. Heart: RRR with S1 S2. Late systolic murmur RUSB. No rubs or gallops appreciated. Abdomen: Soft, non-tender, non-distended with normoactive bowel sounds. No hepatomegaly. No rebound/guarding. No obvious abdominal masses. Msk:  Strength and tone appear normal for age. Extremities: No clubbing or cyanosis. No edema.  Distal pedal pulses are 2+ and equal bilaterally. Neuro: Alert and oriented X 3. Poor eye contact given blindness. No facial asymmetry. No focal deficit. Moves all extremities spontaneously. Psych:  Responds to questions appropriately with a normal affect.  EKG:  The EKG was personally reviewed and demonstrates NSR 63bpm, with inferior TW changes as well as V4-V6, occasional PACs, QTc  Relevant CV Studies: 2D Echo 04/02/18 Study Conclusions  - Left ventricle: The cavity size was normal. Systolic function was   normal. The estimated ejection fraction was in the range of 60%   to 65%. Wall motion was normal; there were no regional wall   motion abnormalities. Doppler parameters are consistent with   abnormal left ventricular relaxation (grade 1 diastolic   dysfunction). There was no evidence of elevated ventricular   filling pressure by Doppler parameters. - Mitral valve: Calcified annulus. Mildly thickened leaflets .   There was trivial regurgitation. - Left atrium: The atrium was normal in size. - Right ventricle: The cavity size was normal. Wall thickness was   normal. Systolic function was normal. - Right atrium: The atrium was  normal in size. - Tricuspid valve: There was mild regurgitation. - Pulmonary arteries: Systolic pressure was within the normal   range. - Inferior vena cava: The vessel was normal in size. - Pericardium, extracardiac: There was no pericardial effusion.  Laboratory Data:  Chemistry Recent Labs  Lab 04/01/18 1748 04/02/18 0202  NA 142 139  K 3.6 3.7  CL 107 106  CO2 23 24  GLUCOSE 105* 102*  BUN 13 14  CREATININE 0.80 0.72  CALCIUM 9.4 9.1  GFRNONAA >60 >60  GFRAA >60 >60  ANIONGAP 12 9    Recent Labs  Lab 04/01/18 1748  PROT 7.1  ALBUMIN 3.7  AST 18  ALT 14  ALKPHOS 118  BILITOT 0.9   Hematology Recent Labs  Lab 04/01/18 1748 04/02/18 0202  WBC 8.4 7.8  RBC 4.33 4.14  HGB 14.1 13.5  HCT 42.7 40.9  MCV 98.6 98.8  MCH 32.6 32.6  MCHC 33.0 33.0  RDW 12.5 12.6  PLT 244 238   Cardiac Enzymes Recent Labs  Lab 04/02/18 0202 04/02/18 1108  TROPONINI <0.03 <0.03    Recent Labs  Lab 04/01/18 1759  TROPIPOC 0.00    BNPNo results for input(s): BNP, PROBNP in the last 168 hours.  DDimer  Recent Labs  Lab 04/01/18 1748  DDIMER 0.90*    Radiology/Studies:  Dg Chest 2 View  Result Date: 04/01/2018 CLINICAL DATA:  Chest pain radiating to the left arm. EXAM: CHEST - 2 VIEW COMPARISON:  03/23/2018 FINDINGS: Hyperinflated lungs without pulmonary consolidation or CHF. Heart and mediastinal contours are stable and within normal limits. There is aortic atherosclerosis without aneurysmal dilatation. No effusion or pneumothorax. Redemonstration of upper and lower thoracic compression fractures without significant change. IMPRESSION: Hyperinflated lungs without active pulmonary disease. Aortic atherosclerosis. Likely remote upper and lower thoracic compression fractures. Electronically Signed   By: Tollie Eth M.D.   On: 04/01/2018 18:31   Ct Angio Chest Pe W And/or Wo Contrast  Result Date: 04/01/2018 CLINICAL DATA:  Chest pain radiating to the left arm and back.  Positive D-dimer. EXAM: CT ANGIOGRAPHY CHEST WITH CONTRAST TECHNIQUE: Multidetector CT imaging of the chest was performed using the standard protocol during bolus administration of intravenous contrast. Multiplanar CT image reconstructions and MIPs were obtained to evaluate the vascular anatomy. CONTRAST:  ISOVUE-370 IOPAMIDOL (ISOVUE-370) INJECTION 76% COMPARISON:  None. FINDINGS: Cardiovascular: Contrast injection is sufficient to demonstrate satisfactory opacification of the pulmonary arteries to the segmental level. There is no pulmonary embolus. The main pulmonary artery is within normal limits for size. There is moderateaortic atherosclerosis. Heart size is normal, without pericardial effusion. Mediastinum/Nodes: No mediastinal, hilar or axillary lymphadenopathy. The visualized thyroid and thoracic esophageal course are unremarkable. Lungs/Pleura: No pulmonary nodules or masses. No pleural effusion or pneumothorax. No focal airspace consolidation. No focal pleural abnormality. Upper Abdomen: Contrast bolus timing is not optimized for evaluation of the abdominal organs. Within this limitation, the visualized organs of the upper abdomen are normal. Musculoskeletal: T3 and T7 compression deformities are likely chronic. Review of the MIP images confirms the above findings. IMPRESSION: 1. No pulmonary embolus or other acute thoracic abnormality. 2. Compression deformities at T3 and T7 are likely chronic. 3.  Aortic Atherosclerosis (ICD10-I70.0). Electronically Signed   By: Deatra Robinson M.D.   On: 04/01/2018 21:50    Assessment and Plan:   1. Chest pain, unprovoked with reported h/o CAD ("blockage in back of heart") -her troponins are negative thus far, but EKG shows nonspecific changes which  could be related to ischemia. We do not have any prior to compare to. She has a high pretest probability of obstructive lesion given her reported history of coronary disease and other vascular risk factors. I have  written an order for the floor to obtain the cardiac cath report and discharge summary from [facility] so that we can review this.  Per discussion with MD, likely needs a heart catheterization on Monday.  Ideally this would be done radially given her history of peripheral vascular disease. Continue aspirin as it has been ordered. It appears her dose was held earlier because she was n.p.o. I wrote a care order to please administer. We will let her eat today. No beta-blocker at present time given her bradycardia.  Will escalate the newly initiated atorvastatin to 80 mg every afternoon. If she is stable tomorrow, will need to put on the cath board tomorrow and do orders. Would be cautious with SL NTG in the future given the hypotension earlier today. Per MD, hold off full dose heparin unless enzymes turn positive.  2. Frequent PACs/PVCs and sinus bradycardia -it is not clear if she is symptomatic with this.  She did have bradycardia earlier today in the late morning, but the exact correlation with her chest pain is not really known.  We will check a TSH.  Avoid AV nodal blocking agents.  The concern here would be a link between RCA stenosis and resultant bradycardia.  3. Peripheral artery disease - will need OP establishment with PV MD.  4. Heart murmur - echo fairly unrevealing, mild TR. She denies any prior treatment for this.  5. Hyperlipidemia - LDL 173. See above re: statin. If the patient is tolerating statin at time of follow-up appointment, would consider rechecking liver function/lipid panel in 6-8 weeks.  6. Former tobacco abuse - the patient reports she quit but there's some disagreement from the family in the room. Importance of abstinence reinforced.  7. H/o stroke - will defer to IM about re-initiation of Plavix.  For questions or updates, please contact [facility] Please consult [email or web address] for contact info under Cardiology/STEMI.    Signed, Laurann Montana, PA-C  04/02/2018 1:41  PM

## 2018-04-02 NOTE — ED Notes (Signed)
Next blood draw is @ 0510

## 2018-04-02 NOTE — ED Notes (Signed)
Patient reports feeling nausea.

## 2018-04-02 NOTE — ED Notes (Signed)
REPORT GIVEN TO LISA rn

## 2018-04-02 NOTE — ED Notes (Signed)
MD at bedside. 

## 2018-04-02 NOTE — Progress Notes (Addendum)
Pt presented with chest pain and heaviness (mid-sternal)  of 9/10 radiating down left arm.  Notified Dr. Renford Dills, MD to place orders for STAT EKG, troponins, and nitroglycerin 0.4 mg x3. Rapid response RN, Verlon Au called.  Pain level 9/10 after first SL nitro, 5 after, 2nd SL nitro and B/P 83/60.  Rapid response RN, Verlon Au placed order for 250 ml NS bolus, B/P post bolus 124/55. 3rd SL nitro administered, post admin pain 1/10, B/P 106/54.  Provider Renford Dills, MD notified of chest pain event, rapid response notification and current status. Will continuous to monitor patient.

## 2018-04-02 NOTE — Progress Notes (Signed)
PROGRESS NOTE    Mary Nguyen  ZOX:[id number] DOB: 1951-05-30 DOA: 04/01/2018 PCP: Patient, No Pcp Per   Brief Narrative: Patient is a 67 year old female with past medical history of CVA with residual right-sided deficit, coronary artery disease ,blindness, history of peripheral artery disease who presents to the emergency department with left-sided chest pain .Patient had been having recurrent chest pain since last few days.  Patient was also noted to be bradycardic on presentation.  Cardiology consulted .Plan for cardiac catheterization on Monday.  Assessment & Plan:   Principal Problem:   Chest pain Active Problems:   History of stroke   Hyperlipidemia   Chest pain: Likely ischemic.  Troponins negative.  EKG did not show any  ST changes. D-dimer was found to be elevated but CT angiogram did not show any acute PE.  Admitted to telemetry. We will continue to cycle troponin.  Nitroglycerin as needed for chest pain.  Echocardiogram done.Does not show any wall motion abnormality, normal ejection fraction, grade 1 diastolic dysfunction.  Continue aspirin. Cardiology  of planning for cath on Monday.  Patient has high risk for coronary artery disease.  She is  noncompliant with medication because he states he cannot afford.  Bradycardia: Might be secondary to her coronary artery disease.  We will continue to monitor in telemetry.  Patient also noted to have frequent PACs/PVCs.  Urinary tract infection: Complains of dysuria.  We will follow-up urine cultures.  Started on Levaquin.  History of CVA/PAD: Residual right-sided weakness.  Continue Lipitor and aspirin.  Hyperlipidemia: LDL of 173.  Lipitor restarted.  Patient has history of hyperlipidemia but does not take medication because he cannot afford.   DVT prophylaxis: Lovenox Code Status: Full Family Communication: Son of the bedside Disposition Plan: Needs to be determined, waiting for cath   Consultants:  Cardiology  Procedures: Echocardiogram  Antimicrobials: Levaquin since 04/01/2018  Subjective: Patient seen and examined the bedside this morning.  During my evaluation she looks comfortable.  Denies any chest pain.  Objective: Vitals:   04/02/18 1133 04/02/18 1137 04/02/18 1140 04/02/18 1304  BP:   (!) 106/54 (!) 105/47  Pulse:    (!) 55  Resp: 19 20 (!) 22 17  Temp:    98.5 F (36.9 C)  TempSrc:    Oral  SpO2: 95% 96% 94% 99%  Weight:      Height:       No intake or output data in the 24 hours ending 04/02/18 1553 Filed Weights   04/01/18 1733 04/02/18 0834  Weight: 49.9 kg (110 lb) 42.2 kg (93 lb 0.6 oz)    Examination:  General exam: Appears calm and comfortable ,Not in distress, thin/fragile HEENT: Bilateral blindness, cataract on the right eye ,oral mucosa moist, Ear/Nose normal on gross exam Respiratory system: Bilateral equal air entry, normal vesicular breath sounds, no wheezes or crackles  Cardiovascular system: S1 & S2 heard, RRR. No JVD, murmurs, rubs, gallops or clicks. No pedal edema. Gastrointestinal system: Abdomen is nondistended, soft and nontender. No organomegaly or masses felt. Normal bowel sounds heard. Central nervous system: Alert and oriented. No focal neurological deficits. Extremities: No edema, no clubbing ,no cyanosis, distal peripheral pulses palpable. Skin: No rashes, lesions or ulcers,no icterus ,no pallor Psychiatry: Judgement and insight appear normal. Mood & affect appropriate.     Data Reviewed: I have personally reviewed following labs and imaging studies  CBC: Recent Labs  Lab 04/01/18 1748 04/02/18 0202  WBC 8.4 7.8  NEUTROABS  --  3.4  HGB 14.1 13.5  HCT 42.7 40.9  MCV 98.6 98.8  PLT 244 238   Basic Metabolic Panel: Recent Labs  Lab 04/01/18 1748 04/02/18 0202  NA 142 139  K 3.6 3.7  CL 107 106  CO2 23 24  GLUCOSE 105* 102*  BUN 13 14  CREATININE 0.80 0.72  CALCIUM 9.4 9.1   GFR: Estimated Creatinine  Clearance: 46.7 mL/min (by C-G formula based on SCr of 0.72 mg/dL). Liver Function Tests: Recent Labs  Lab 04/01/18 1748  AST 18  ALT 14  ALKPHOS 118  BILITOT 0.9  PROT 7.1  ALBUMIN 3.7   Recent Labs  Lab 04/01/18 1748  LIPASE 29   No results for input(s): AMMONIA in the last 168 hours. Coagulation Profile: No results for input(s): INR, PROTIME in the last 168 hours. Cardiac Enzymes: Recent Labs  Lab 04/02/18 0202 04/02/18 1108  TROPONINI <0.03 <0.03   BNP (last 3 results) No results for input(s): PROBNP in the last 8760 hours. HbA1C: No results for input(s): HGBA1C in the last 72 hours. CBG: No results for input(s): GLUCAP in the last 168 hours. Lipid Profile: Recent Labs    04/02/18 0206  CHOL 227*  HDL 41  LDLCALC 173*  TRIG 65  CHOLHDL 5.5   Thyroid Function Tests: Recent Labs    04/02/18 1402  TSH 1.754   Anemia Panel: No results for input(s): VITAMINB12, FOLATE, FERRITIN, TIBC, IRON, RETICCTPCT in the last 72 hours. Sepsis Labs: No results for input(s): PROCALCITON, LATICACIDVEN in the last 168 hours.  Recent Results (from the past 240 hour(s))  C difficile quick scan w PCR reflex     Status: None   Collection Time: 03/23/18  8:10 PM  Result Value Ref Range Status   C Diff antigen NEGATIVE NEGATIVE Final   C Diff toxin NEGATIVE NEGATIVE Final   C Diff interpretation No C. difficile detected.  Final         Radiology Studies: Dg Chest 2 View  Result Date: 04/01/2018 CLINICAL DATA:  Chest pain radiating to the left arm. EXAM: CHEST - 2 VIEW COMPARISON:  03/23/2018 FINDINGS: Hyperinflated lungs without pulmonary consolidation or CHF. Heart and mediastinal contours are stable and within normal limits. There is aortic atherosclerosis without aneurysmal dilatation. No effusion or pneumothorax. Redemonstration of upper and lower thoracic compression fractures without significant change. IMPRESSION: Hyperinflated lungs without active pulmonary  disease. Aortic atherosclerosis. Likely remote upper and lower thoracic compression fractures. Electronically Signed   By: Tollie Eth M.D.   On: 04/01/2018 18:31   Ct Angio Chest Pe W And/or Wo Contrast  Result Date: 04/01/2018 CLINICAL DATA:  Chest pain radiating to the left arm and back. Positive D-dimer. EXAM: CT ANGIOGRAPHY CHEST WITH CONTRAST TECHNIQUE: Multidetector CT imaging of the chest was performed using the standard protocol during bolus administration of intravenous contrast. Multiplanar CT image reconstructions and MIPs were obtained to evaluate the vascular anatomy. CONTRAST:  ISOVUE-370 IOPAMIDOL (ISOVUE-370) INJECTION 76% COMPARISON:  None. FINDINGS: Cardiovascular: Contrast injection is sufficient to demonstrate satisfactory opacification of the pulmonary arteries to the segmental level. There is no pulmonary embolus. The main pulmonary artery is within normal limits for size. There is moderateaortic atherosclerosis. Heart size is normal, without pericardial effusion. Mediastinum/Nodes: No mediastinal, hilar or axillary lymphadenopathy. The visualized thyroid and thoracic esophageal course are unremarkable. Lungs/Pleura: No pulmonary nodules or masses. No pleural effusion or pneumothorax. No focal airspace consolidation. No focal pleural abnormality. Upper Abdomen: Contrast bolus timing is not optimized for  evaluation of the abdominal organs. Within this limitation, the visualized organs of the upper abdomen are normal. Musculoskeletal: T3 and T7 compression deformities are likely chronic. Review of the MIP images confirms the above findings. IMPRESSION: 1. No pulmonary embolus or other acute thoracic abnormality. 2. Compression deformities at T3 and T7 are likely chronic. 3.  Aortic Atherosclerosis (ICD10-I70.0). Electronically Signed   By: Deatra [facility] M.D.   On: 04/01/2018 21:50        Scheduled Meds: . aspirin EC  81 mg Oral Daily  . atorvastatin  80 mg Oral q1800  .  enoxaparin (LOVENOX) injection  40 mg Subcutaneous Daily  . [START ON 04/03/2018] levofloxacin  250 mg Oral Daily   Continuous Infusions:   LOS: 0 days    Time spent: 25 mins.More than 50% of that time was spent in counseling and/or coordination of care.      Burnadette Pop, MD [facility] Pager [phone]  If 7PM-7AM, please contact night-coverage [email or web address] Password [facility] 04/02/2018, 3:53 PM

## 2018-04-03 DIAGNOSIS — E785 Hyperlipidemia, unspecified: Secondary | ICD-10-CM | POA: Diagnosis not present

## 2018-04-03 DIAGNOSIS — I200 Unstable angina: Secondary | ICD-10-CM | POA: Diagnosis not present

## 2018-04-03 DIAGNOSIS — Z8673 Personal history of transient ischemic attack (TIA), and cerebral infarction without residual deficits: Secondary | ICD-10-CM | POA: Diagnosis not present

## 2018-04-03 LAB — BASIC METABOLIC PANEL
ANION GAP: 8 (ref 5–15)
BUN: 14 mg/dL (ref 6–20)
CALCIUM: 8.6 mg/dL — AB (ref 8.9–10.3)
CO2: 24 mmol/L (ref 22–32)
Chloride: 108 mmol/L (ref 101–111)
Creatinine, Ser: 0.67 mg/dL (ref 0.44–1.00)
GFR calc Af Amer: >60 mL/min (ref >60)
Glucose, Bld: 99 mg/dL (ref 65–99)
POTASSIUM: 3.8 mmol/L (ref 3.5–5.1)
SODIUM: 140 mmol/L (ref 135–145)

## 2018-04-03 LAB — URINE CULTURE

## 2018-04-03 MED ORDER — SODIUM CHLORIDE 0.9 % IV SOLN: 250.0000 mL | INTRAVENOUS | Status: DC | PRN

## 2018-04-03 MED ORDER — ASPIRIN 81 MG PO CHEW
81.0000 mg | CHEWABLE_TABLET | ORAL | Status: AC
  Administered 2018-04-04: 81 mg via ORAL
  Filled 2018-04-03: qty 1

## 2018-04-03 MED ORDER — SODIUM CHLORIDE 0.9% FLUSH: 3.0000 mL | INTRAVENOUS | Status: DC | PRN

## 2018-04-03 MED ORDER — SODIUM CHLORIDE 0.9 % WEIGHT BASED INFUSION
3.0000 mL/kg/h | INTRAVENOUS | Status: DC
  Administered 2018-04-04: 3 mL/kg/h via INTRAVENOUS

## 2018-04-03 MED ORDER — SODIUM CHLORIDE 0.9% FLUSH: 3.0000 mL | Freq: Two times a day (BID) | INTRAVENOUS | Status: DC

## 2018-04-03 MED ORDER — SODIUM CHLORIDE 0.9 % WEIGHT BASED INFUSION: 1.0000 mL/kg/h | INTRAVENOUS | Status: DC

## 2018-04-03 NOTE — Progress Notes (Signed)
DAILY PROGRESS NOTE   Patient Name: Mary Nguyen Date of Encounter: 04/03/2018  Chief Complaint   No further chest pain overnight  Patient Profile   Mary Nguyen is a 67 y.o. 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 Tamala Julian.  Subjective   Troponins negative - normal LVEF on echo. Chest pain has resolved. HR has improved somewhat, now in the mid-upper 60's. Symptoms concerning for unstable angina.  Objective   Vitals:   04/02/18 1740 04/02/18 1947 04/03/18 0003 04/03/18 0330  BP:  (!) 100/43 (!) 100/43 (!) 105/31  Pulse:   (!) 55 (!) 56  Resp: '18 18 14 15  ' Temp:  98.4 F (36.9 C) 98 F (36.7 C) 98 F (36.7 C)  TempSrc:  Oral Oral Oral  SpO2:  97% 95% 96%  Weight:      Height:        Intake/Output Summary (Last 24 hours) at 04/03/2018 1103 Last data filed at 04/02/2018 1800 Gross per 24 hour  Intake 240 ml  Output -  Net 240 ml   Filed Weights   04/01/18 1733 04/02/18 0834  Weight: 110 lb (49.9 kg) 93 lb 0.6 oz (42.2 kg)    Physical Exam   General appearance: alert, cachectic and no distress Neck: no JVD, supple, symmetrical, trachea midline and thyroid not enlarged, symmetric, no tenderness/mass/nodules Lungs: clear to auscultation bilaterally Heart: regular rate and rhythm Abdomen: soft, non-tender; bowel sounds normal; no masses,  no organomegaly Extremities: extremities normal, atraumatic, no cyanosis or edema Pulses: 2+ and symmetric Skin: Skin color, texture, turgor normal. No rashes or lesions Neurologic: Grossly normal PSych: Pleasant  Inpatient Medications    Scheduled Meds: . aspirin EC  81 mg Oral Daily  . atorvastatin  80 mg Oral q1800  . enoxaparin (LOVENOX) injection  40 mg Subcutaneous Daily  . gabapentin  300 mg Oral TID  . levofloxacin  250 mg Oral  Daily    Continuous Infusions:   PRN Meds: acetaminophen, gi cocktail, morphine injection, nitroGLYCERIN, ondansetron (ZOFRAN) IV   Labs   Results for orders placed or performed during the hospital encounter of 04/01/18 (from the past 48 hour(s))  Basic metabolic panel     Status: Abnormal   Collection Time: 04/01/18  5:48 PM  Result Value Ref Range   Sodium 142 135 - 145 mmol/L   Potassium 3.6 3.5 - 5.1 mmol/L   Chloride 107 101 - 111 mmol/L   CO2 23 22 - 32 mmol/L   Glucose, Bld 105 (H) 65 - 99 mg/dL   BUN 13 6 - 20 mg/dL   Creatinine, Ser 0.80 0.44 - 1.00 mg/dL   Calcium 9.4 8.9 - 10.3 mg/dL   GFR calc non Af Amer >60 >60 mL/min   GFR calc Af Amer >60 >60 mL/min    Comment: (NOTE) The eGFR has been calculated using the CKD EPI equation. This calculation has not been validated in all clinical situations. eGFR's persistently <60 mL/min signify possible Chronic Kidney Disease.    Anion gap 12 5 - 15    Comment: Performed at [facility] [address] [place], [place] [address]  CBC     Status: None   Collection Time: 04/01/18  5:48 PM  Result Value Ref Range   WBC 8.4 4.0 - 10.5 K/uL   RBC 4.33 3.87 -  5.11 MIL/uL   Hemoglobin 14.1 12.0 - 15.0 g/dL   HCT 42.7 36.0 - 46.0 %   MCV 98.6 78.0 - 100.0 fL   MCH 32.6 26.0 - 34.0 pg   MCHC 33.0 30.0 - 36.0 g/dL   RDW 12.5 11.5 - 15.5 %   Platelets 244 150 - 400 K/uL    Comment: Performed at [facility], [place] [address], [place], [place] [address]  Hepatic function panel     Status: Abnormal   Collection Time: 04/01/18  5:48 PM  Result Value Ref Range   Total Protein 7.1 6.5 - 8.1 g/dL   Albumin 3.7 3.5 - 5.0 g/dL   AST 18 15 - 41 U/L   ALT 14 14 - 54 U/L   Alkaline Phosphatase 118 38 - 126 U/L   Total Bilirubin 0.9 0.3 - 1.2 mg/dL   Bilirubin, Direct <0.1 (L) 0.1 - 0.5 mg/dL   Indirect Bilirubin NOT CALCULATED 0.3 - 0.9 mg/dL    Comment: Performed at [facility] [address] [place] [address]  D-dimer, quantitative (not at [facility])     Status: Abnormal   Collection Time: 04/01/18  5:48 PM  Result Value Ref Range   D-Dimer, Quant 0.90 (H) 0.00 - 0.50 ug/mL-FEU    Comment: (NOTE) At the manufacturer cut-off of 0.50 ug/mL FEU, this assay has been documented to exclude PE with a sensitivity and negative predictive value of 97 to 99%.  At this time, this assay has not been approved by the FDA to exclude DVT/VTE. Results should be correlated with clinical presentation. Performed at [facility], [place] [address], [place], [place] [address]   Lipase, blood     Status: None   Collection Time: 04/01/18  5:48 PM  Result Value Ref Range   Lipase 29 11 - 51 U/L    Comment: Performed at [facility] [address], [place], [place] [address]  I-stat troponin, ED     Status: None   Collection Time: 04/01/18  5:59 PM  Result Value Ref Range   Troponin i, poc 0.00 0.00 - 0.08 ng/mL   Comment 3            Comment: Due to the release kinetics of cTnI, a negative result within the first hours of the onset of symptoms does not rule out myocardial infarction with certainty. If myocardial infarction is still suspected, repeat the test at appropriate intervals.   Urine culture     Status: Abnormal   Collection Time: 04/01/18  6:03 PM  Result Value Ref Range   Specimen Description URINE, RANDOM    Special Requests      NONE Performed at [facility], [place] [address] [place], [place] [address]    Culture MULTIPLE SPECIES PRESENT, SUGGEST RECOLLECTION (A)    Report Status 04/03/2018 FINAL   Urinalysis, Routine w reflex microscopic     Status: Abnormal   Collection Time: 04/01/18  7:27 PM  Result Value Ref Range   Color, Urine YELLOW YELLOW   APPearance CLOUDY (A) CLEAR   Specific Gravity, Urine 1.026 1.005 - 1.030   pH 5.0 5.0 - 8.0   Glucose, UA NEGATIVE NEGATIVE mg/dL   Hgb urine dipstick SMALL (A) NEGATIVE   Bilirubin Urine NEGATIVE NEGATIVE   Ketones,  ur 20 (A) NEGATIVE mg/dL   Protein, ur NEGATIVE NEGATIVE mg/dL   Nitrite NEGATIVE NEGATIVE   Leukocytes, UA MODERATE (A) NEGATIVE  RBC / HPF 0-5 0 - 5 RBC/hpf   WBC, UA 11-20 0 - 5 WBC/hpf   Bacteria, UA FEW (A) NONE SEEN   Squamous Epithelial / LPF 0-5 0 - 5    Comment: Please note change in reference range.   Mucus PRESENT    Ca Oxalate Crys, UA PRESENT     Comment: Performed at [facility] [address], [place], [place] [address]  Troponin I-serum (0, 3, 6 hours)     Status: None   Collection Time: 04/02/18  2:02 AM  Result Value Ref Range   Troponin I <0.03 <0.03 ng/mL    Comment: Performed at [facility] [address], [place], [place] [address]  CBC with Differential/Platelet     Status: None   Collection Time: 04/02/18  2:02 AM  Result Value Ref Range   WBC 7.8 4.0 - 10.5 K/uL   RBC 4.14 3.87 - 5.11 MIL/uL   Hemoglobin 13.5 12.0 - 15.0 g/dL   HCT 40.9 36.0 - 46.0 %   MCV 98.8 78.0 - 100.0 fL   MCH 32.6 26.0 - 34.0 pg   MCHC 33.0 30.0 - 36.0 g/dL   RDW 12.6 11.5 - 15.5 %   Platelets 238 150 - 400 K/uL   Neutrophils Relative % 44 %   Neutro Abs 3.4 1.7 - 7.7 K/uL   Lymphocytes Relative 43 %   Lymphs Abs 3.4 0.7 - 4.0 K/uL   Monocytes Relative 10 %   Monocytes Absolute 0.8 0.1 - 1.0 K/uL   Eosinophils Relative 3 %   Eosinophils Absolute 0.2 0.0 - 0.7 K/uL   Basophils Relative 0 %   Basophils Absolute 0.0 0.0 - 0.1 K/uL    Comment: Performed at [facility] [address], [place], [place] [address]  Basic metabolic panel     Status: Abnormal   Collection Time: 04/02/18  2:02 AM  Result Value Ref Range   Sodium 139 135 - 145 mmol/L   Potassium 3.7 3.5 - 5.1 mmol/L   Chloride 106 101 - 111 mmol/L   CO2 24 22 - 32 mmol/L   Glucose, Bld 102 (H) 65 - 99 mg/dL   BUN 14 6 - 20 mg/dL   Creatinine, Ser 0.72 0.44 - 1.00 mg/dL   Calcium 9.1 8.9 - 10.3 mg/dL   GFR calc non Af Amer >60 >60 mL/min   GFR calc Af Amer >60 >60 mL/min    Comment:  (NOTE) The eGFR has been calculated using the CKD EPI equation. This calculation has not been validated in all clinical situations. eGFR's persistently <60 mL/min signify possible Chronic Kidney Disease.    Anion gap 9 5 - 15    Comment: Performed at [facility] [address], [place],  [address]  Lipid panel     Status: Abnormal   Collection Time: 04/02/18  2:06 AM  Result Value Ref Range   Cholesterol 227 (H) 0 - 200 mg/dL   Triglycerides 65 <150 mg/dL   HDL 41 >40 mg/dL   Total CHOL/HDL Ratio 5.5 RATIO   VLDL 13 0 - 40 mg/dL   LDL Cholesterol 173 (H) 0 - 99 mg/dL    Comment:        Total Cholesterol/HDL:CHD Risk Coronary Heart Disease Risk Table                     Men   Women  1/2 Average Risk  3.4   3.3  Average Risk       5.0   4.4  2 X Average Risk   9.6   7.1  3 X Average Risk  23.4   11.0        Use the calculated Patient Ratio above and the CHD Risk Table to determine the patient's CHD Risk.        ATP III CLASSIFICATION (LDL):  <100     mg/dL   Optimal  100-129  mg/dL   Near or Above                    Optimal  130-159  mg/dL   Borderline  160-189  mg/dL   High  >190     mg/dL   Very High Performed at [facility] [address] [place], [place] [address]   HIV antibody (Routine Testing)     Status: None   Collection Time: 04/02/18  2:06 AM  Result Value Ref Range   HIV Screen 4th Generation wRfx Non Reactive Non Reactive    Comment: (NOTE) Performed At: [facility] [address] [place], [place] [id number] Rush Farmer MD EN:[id number] Performed at [facility], [place] [address] [place], [place] [address]   Troponin I (q 6hr x 3)     Status: None   Collection Time: 04/02/18 11:08 AM  Result Value Ref Range   Troponin I <0.03 <0.03 ng/mL    Comment: Performed at [facility] [address], [place], [place] [address]  TSH     Status: None   Collection Time: 04/02/18  2:02 PM  Result Value Ref Range    TSH 1.754 0.350 - 4.500 uIU/mL    Comment: Performed by a 3rd Generation assay with a functional sensitivity of <=0.01 uIU/mL. Performed at [facility], [place] [address] [place], [place] [address]   Troponin I (q 6hr x 3)     Status: None   Collection Time: 04/02/18  4:58 PM  Result Value Ref Range   Troponin I <0.03 <0.03 ng/mL    Comment: Performed at [facility] [address] [place], [place] [address]  Troponin I (q 6hr x 3)     Status: None   Collection Time: 04/02/18 10:39 PM  Result Value Ref Range   Troponin I <0.03 <0.03 ng/mL    Comment: Performed at [facility] [address] [place], [place] [address]  Basic metabolic panel     Status: Abnormal   Collection Time: 04/03/18  3:00 AM  Result Value Ref Range   Sodium 140 135 - 145 mmol/L   Potassium 3.8 3.5 - 5.1 mmol/L   Chloride 108 101 - 111 mmol/L   CO2 24 22 - 32 mmol/L   Glucose, Bld 99 65 - 99 mg/dL   BUN 14 6 - 20 mg/dL   Creatinine, Ser 0.67 0.44 - 1.00 mg/dL   Calcium 8.6 (L) 8.9 - 10.3 mg/dL   GFR calc non Af Amer >60 >60 mL/min   GFR calc Af Amer >60 >60 mL/min    Comment: (NOTE) The eGFR has been calculated using the CKD EPI equation. This calculation has not been validated in all clinical situations. eGFR's persistently <60 mL/min signify possible Chronic Kidney Disease.    Anion gap 8 5 - 15    Comment: Performed at [facility] [address], [place], [place] [address]  ECG   N/A  Telemetry   Sinus bradycardia - Personally Reviewed  Radiology    Dg Chest 2 View  Result Date: 04/01/2018 CLINICAL DATA:  Chest pain radiating to the left arm. EXAM: CHEST - 2 VIEW COMPARISON:  03/23/2018 FINDINGS: Hyperinflated lungs without pulmonary consolidation or CHF. Heart and mediastinal contours are stable and within normal limits. There is aortic atherosclerosis without aneurysmal dilatation. No effusion or pneumothorax. Redemonstration of upper and lower thoracic  compression fractures without significant change. IMPRESSION: Hyperinflated lungs without active pulmonary disease. Aortic atherosclerosis. Likely remote upper and lower thoracic compression fractures. Electronically Signed   By: Ashley Royalty M.D.   On: 04/01/2018 18:31   Ct Angio Chest Pe W And/or Wo Contrast  Result Date: 04/01/2018 CLINICAL DATA:  Chest pain radiating to the left arm and back. Positive D-dimer. EXAM: CT ANGIOGRAPHY CHEST WITH CONTRAST TECHNIQUE: Multidetector CT imaging of the chest was performed using the standard protocol during bolus administration of intravenous contrast. Multiplanar CT image reconstructions and MIPs were obtained to evaluate the vascular anatomy. CONTRAST:  190m ISOVUE-370 IOPAMIDOL (ISOVUE-370) INJECTION 76% COMPARISON:  None. FINDINGS: Cardiovascular: Contrast injection is sufficient to demonstrate satisfactory opacification of the pulmonary arteries to the segmental level. There is no pulmonary embolus. The main pulmonary artery is within normal limits for size. There is moderateaortic atherosclerosis. Heart size is normal, without pericardial effusion. Mediastinum/Nodes: No mediastinal, hilar or axillary lymphadenopathy. The visualized thyroid and thoracic esophageal course are unremarkable. Lungs/Pleura: No pulmonary nodules or masses. No pleural effusion or pneumothorax. No focal airspace consolidation. No focal pleural abnormality. Upper Abdomen: Contrast bolus timing is not optimized for evaluation of the abdominal organs. Within this limitation, the visualized organs of the upper abdomen are normal. Musculoskeletal: T3 and T7 compression deformities are likely chronic. Review of the MIP images confirms the above findings. IMPRESSION: 1. No pulmonary embolus or other acute thoracic abnormality. 2. Compression deformities at T3 and T7 are likely chronic. 3.  Aortic Atherosclerosis (ICD10-I70.0). Electronically Signed   By: KUlyses JarredM.D.   On: 04/01/2018 21:50     Cardiac Studies   LV EF: 60% -   65%  ------------------------------------------------------------------- Indications:      Chest pain 786.51.  ------------------------------------------------------------------- History:   PMH:   Murmur.  Risk factors:  History of stroke. Current tobacco use. Dyslipidemia.  ------------------------------------------------------------------- Study Conclusions  - Left ventricle: The cavity size was normal. Systolic function was   normal. The estimated ejection fraction was in the range of 60%   to 65%. Wall motion was normal; there were no regional wall   motion abnormalities. Doppler parameters are consistent with   abnormal left ventricular relaxation (grade 1 diastolic   dysfunction). There was no evidence of elevated ventricular   filling pressure by Doppler parameters. - Mitral valve: Calcified annulus. Mildly thickened leaflets .   There was trivial regurgitation. - Left atrium: The atrium was normal in size. - Right ventricle: The cavity size was normal. Wall thickness was   normal. Systolic function was normal. - Right atrium: The atrium was normal in size. - Tricuspid valve: There was mild regurgitation. - Pulmonary arteries: Systolic pressure was within the normal   range. - Inferior vena cava: The vessel was normal in size. - Pericardium, extracardiac: There was no pericardial effusion.  Assessment   1. Principal Problem: 2.   Chest pain 3. Active Problems: 4.   History of stroke 5.   Hyperlipidemia 6.   Plan   1. No further  chest pain - symptoms concerning for unstable angina. Multiple CRF's - echo shows normal LV function. Will plan LHC tomorrow. Keep NPO p MN - would try radial approach as femoral pulses are difficult to palpate.  Time Spent Directly with Patient:  I have spent a total of 25 minutes with the patient reviewing hospital notes, telemetry, EKGs, labs and examining the patient as well as establishing  an assessment and plan that was discussed personally with the patient. > 50% of time was spent in direct patient care.  Length of Stay:  LOS: 0 days   Pixie Casino, MD, [facility], [place] Director of the [facility] Diplomate of the American Board of Clinical Lipidology Attending Cardiologist  Direct Dial: [phone]  Fax: [phone]  Website:  [email or web address] Teshawn Moan 04/03/2018, 11:03 AM

## 2018-04-03 NOTE — H&P (View-Only) (Signed)
DAILY PROGRESS NOTE   Patient Name: Mary Nguyen Date of Encounter: 04/03/2018  Chief Complaint   No further chest pain overnight  Patient Profile   Mary Nguyen is a 67 y.o. 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 Tamala Julian.  Subjective   Troponins negative - normal LVEF on echo. Chest pain has resolved. HR has improved somewhat, now in the mid-upper 60's. Symptoms concerning for unstable angina.  Objective   Vitals:   04/02/18 1740 04/02/18 1947 04/03/18 0003 04/03/18 0330  BP:  (!) 100/43 (!) 100/43 (!) 105/31  Pulse:   (!) 55 (!) 56  Resp: '18 18 14 15  ' Temp:  98.4 F (36.9 C) 98 F (36.7 C) 98 F (36.7 C)  TempSrc:  Oral Oral Oral  SpO2:  97% 95% 96%  Weight:      Height:        Intake/Output Summary (Last 24 hours) at 04/03/2018 1103 Last data filed at 04/02/2018 1800 Gross per 24 hour  Intake 240 ml  Output -  Net 240 ml   Filed Weights   04/01/18 1733 04/02/18 0834  Weight: 110 lb (49.9 kg) 93 lb 0.6 oz (42.2 kg)    Physical Exam   General appearance: alert, cachectic and no distress Neck: no JVD, supple, symmetrical, trachea midline and thyroid not enlarged, symmetric, no tenderness/mass/nodules Lungs: clear to auscultation bilaterally Heart: regular rate and rhythm Abdomen: soft, non-tender; bowel sounds normal; no masses,  no organomegaly Extremities: extremities normal, atraumatic, no cyanosis or edema Pulses: 2+ and symmetric Skin: Skin color, texture, turgor normal. No rashes or lesions Neurologic: Grossly normal PSych: Pleasant  Inpatient Medications    Scheduled Meds: . aspirin EC  81 mg Oral Daily  . atorvastatin  80 mg Oral q1800  . enoxaparin (LOVENOX) injection  40 mg Subcutaneous Daily  . gabapentin  300 mg Oral TID  . levofloxacin  250 mg Oral  Daily    Continuous Infusions:   PRN Meds: acetaminophen, gi cocktail, morphine injection, nitroGLYCERIN, ondansetron (ZOFRAN) IV   Labs   Results for orders placed or performed during the hospital encounter of 04/01/18 (from the past 48 hour(s))  Basic metabolic panel     Status: Abnormal   Collection Time: 04/01/18  5:48 PM  Result Value Ref Range   Sodium 142 135 - 145 mmol/L   Potassium 3.6 3.5 - 5.1 mmol/L   Chloride 107 101 - 111 mmol/L   CO2 23 22 - 32 mmol/L   Glucose, Bld 105 (H) 65 - 99 mg/dL   BUN 13 6 - 20 mg/dL   Creatinine, Ser 0.80 0.44 - 1.00 mg/dL   Calcium 9.4 8.9 - 10.3 mg/dL   GFR calc non Af Amer >60 >60 mL/min   GFR calc Af Amer >60 >60 mL/min    Comment: (NOTE) The eGFR has been calculated using the CKD EPI equation. This calculation has not been validated in all clinical situations. eGFR's persistently <60 mL/min signify possible Chronic Kidney Disease.    Anion gap 12 5 - 15    Comment: Performed at [facility] [address], [place], [place] [address]  CBC     Status: None   Collection Time: 04/01/18  5:48 PM  Result Value Ref Range   WBC 8.4 4.0 - 10.5 K/uL   RBC 4.33 3.87 -  5.11 MIL/uL   Hemoglobin 14.1 12.0 - 15.0 g/dL   HCT 42.7 36.0 - 46.0 %   MCV 98.6 78.0 - 100.0 fL   MCH 32.6 26.0 - 34.0 pg   MCHC 33.0 30.0 - 36.0 g/dL   RDW 12.5 11.5 - 15.5 %   Platelets 244 150 - 400 K/uL    Comment: Performed at [facility], [place] [address] [place], [place] [address]  Hepatic function panel     Status: Abnormal   Collection Time: 04/01/18  5:48 PM  Result Value Ref Range   Total Protein 7.1 6.5 - 8.1 g/dL   Albumin 3.7 3.5 - 5.0 g/dL   AST 18 15 - 41 U/L   ALT 14 14 - 54 U/L   Alkaline Phosphatase 118 38 - 126 U/L   Total Bilirubin 0.9 0.3 - 1.2 mg/dL   Bilirubin, Direct <0.1 (L) 0.1 - 0.5 mg/dL   Indirect Bilirubin NOT CALCULATED 0.3 - 0.9 mg/dL    Comment: Performed at [facility] [address] [place],  [place] [address]  D-dimer, quantitative (not at [facility])     Status: Abnormal   Collection Time: 04/01/18  5:48 PM  Result Value Ref Range   D-Dimer, Quant 0.90 (H) 0.00 - 0.50 ug/mL-FEU    Comment: (NOTE) At the manufacturer cut-off of 0.50 ug/mL FEU, this assay has been documented to exclude PE with a sensitivity and negative predictive value of 97 to 99%.  At this time, this assay has not been approved by the FDA to exclude DVT/VTE. Results should be correlated with clinical presentation. Performed at [facility], [place] [address] [place], [place] [address]   Lipase, blood     Status: None   Collection Time: 04/01/18  5:48 PM  Result Value Ref Range   Lipase 29 11 - 51 U/L    Comment: Performed at [facility] [address] [place], [place] [address]  I-stat troponin, ED     Status: None   Collection Time: 04/01/18  5:59 PM  Result Value Ref Range   Troponin i, poc 0.00 0.00 - 0.08 ng/mL   Comment 3            Comment: Due to the release kinetics of cTnI, a negative result within the first hours of the onset of symptoms does not rule out myocardial infarction with certainty. If myocardial infarction is still suspected, repeat the test at appropriate intervals.   Urine culture     Status: Abnormal   Collection Time: 04/01/18  6:03 PM  Result Value Ref Range   Specimen Description URINE, RANDOM    Special Requests      NONE Performed at [facility], [place] [address] [place], [place] [address]    Culture MULTIPLE SPECIES PRESENT, SUGGEST RECOLLECTION (A)    Report Status 04/03/2018 FINAL   Urinalysis, Routine w reflex microscopic     Status: Abnormal   Collection Time: 04/01/18  7:27 PM  Result Value Ref Range   Color, Urine YELLOW YELLOW   APPearance CLOUDY (A) CLEAR   Specific Gravity, Urine 1.026 1.005 - 1.030   pH 5.0 5.0 - 8.0   Glucose, UA NEGATIVE NEGATIVE mg/dL   Hgb urine dipstick SMALL (A) NEGATIVE   Bilirubin Urine NEGATIVE NEGATIVE   Ketones,  ur 20 (A) NEGATIVE mg/dL   Protein, ur NEGATIVE NEGATIVE mg/dL   Nitrite NEGATIVE NEGATIVE   Leukocytes, UA MODERATE (A) NEGATIVE  RBC / HPF 0-5 0 - 5 RBC/hpf   WBC, UA 11-20 0 - 5 WBC/hpf   Bacteria, UA FEW (A) NONE SEEN   Squamous Epithelial / LPF 0-5 0 - 5    Comment: Please note change in reference range.   Mucus PRESENT    Ca Oxalate Crys, UA PRESENT     Comment: Performed at [facility] [address], [place], [place] [address]  Troponin I-serum (0, 3, 6 hours)     Status: None   Collection Time: 04/02/18  2:02 AM  Result Value Ref Range   Troponin I <0.03 <0.03 ng/mL    Comment: Performed at [facility] [address], [place], [place] [address]  CBC with Differential/Platelet     Status: None   Collection Time: 04/02/18  2:02 AM  Result Value Ref Range   WBC 7.8 4.0 - 10.5 K/uL   RBC 4.14 3.87 - 5.11 MIL/uL   Hemoglobin 13.5 12.0 - 15.0 g/dL   HCT 40.9 36.0 - 46.0 %   MCV 98.8 78.0 - 100.0 fL   MCH 32.6 26.0 - 34.0 pg   MCHC 33.0 30.0 - 36.0 g/dL   RDW 12.6 11.5 - 15.5 %   Platelets 238 150 - 400 K/uL   Neutrophils Relative % 44 %   Neutro Abs 3.4 1.7 - 7.7 K/uL   Lymphocytes Relative 43 %   Lymphs Abs 3.4 0.7 - 4.0 K/uL   Monocytes Relative 10 %   Monocytes Absolute 0.8 0.1 - 1.0 K/uL   Eosinophils Relative 3 %   Eosinophils Absolute 0.2 0.0 - 0.7 K/uL   Basophils Relative 0 %   Basophils Absolute 0.0 0.0 - 0.1 K/uL    Comment: Performed at [facility] [address] [place], [place] [address]  Basic metabolic panel     Status: Abnormal   Collection Time: 04/02/18  2:02 AM  Result Value Ref Range   Sodium 139 135 - 145 mmol/L   Potassium 3.7 3.5 - 5.1 mmol/L   Chloride 106 101 - 111 mmol/L   CO2 24 22 - 32 mmol/L   Glucose, Bld 102 (H) 65 - 99 mg/dL   BUN 14 6 - 20 mg/dL   Creatinine, Ser 0.72 0.44 - 1.00 mg/dL   Calcium 9.1 8.9 - 10.3 mg/dL   GFR calc non Af Amer >60 >60 mL/min   GFR calc Af Amer >60 >60 mL/min    Comment:  (NOTE) The eGFR has been calculated using the CKD EPI equation. This calculation has not been validated in all clinical situations. eGFR's persistently <60 mL/min signify possible Chronic Kidney Disease.    Anion gap 9 5 - 15    Comment: Performed at [facility] [address], [place], [place] [address]  Lipid panel     Status: Abnormal   Collection Time: 04/02/18  2:06 AM  Result Value Ref Range   Cholesterol 227 (H) 0 - 200 mg/dL   Triglycerides 65 <150 mg/dL   HDL 41 >40 mg/dL   Total CHOL/HDL Ratio 5.5 RATIO   VLDL 13 0 - 40 mg/dL   LDL Cholesterol 173 (H) 0 - 99 mg/dL    Comment:        Total Cholesterol/HDL:CHD Risk Coronary Heart Disease Risk Table                     Men   Women  1/2 Average Risk  3.4   3.3  Average Risk       5.0   4.4  2 X Average Risk   9.6   7.1  3 X Average Risk  23.4   11.0        Use the calculated Patient Ratio above and the CHD Risk Table to determine the patient's CHD Risk.        ATP III CLASSIFICATION (LDL):  <100     mg/dL   Optimal  100-129  mg/dL   Near or Above                    Optimal  130-159  mg/dL   Borderline  160-189  mg/dL   High  >190     mg/dL   Very High Performed at [facility] [address] [place], [place] [address]   HIV antibody (Routine Testing)     Status: None   Collection Time: 04/02/18  2:06 AM  Result Value Ref Range   HIV Screen 4th Generation wRfx Non Reactive Non Reactive    Comment: (NOTE) Performed At: [facility] [address] [place], [place] [id number] Rush Farmer MD [id number] Performed at [facility], [place] [address] [place], [place] [address]   Troponin I (q 6hr x 3)     Status: None   Collection Time: 04/02/18 11:08 AM  Result Value Ref Range   Troponin I <0.03 <0.03 ng/mL    Comment: Performed at [facility] [address] [place], [place] [address]  TSH     Status: None   Collection Time: 04/02/18  2:02 PM  Result Value Ref Range    TSH 1.754 0.350 - 4.500 uIU/mL    Comment: Performed by a 3rd Generation assay with a functional sensitivity of <=0.01 uIU/mL. Performed at [facility], [place] [address] [place], [place] [address]   Troponin I (q 6hr x 3)     Status: None   Collection Time: 04/02/18  4:58 PM  Result Value Ref Range   Troponin I <0.03 <0.03 ng/mL    Comment: Performed at [facility] [address] [place], [place] [address]  Troponin I (q 6hr x 3)     Status: None   Collection Time: 04/02/18 10:39 PM  Result Value Ref Range   Troponin I <0.03 <0.03 ng/mL    Comment: Performed at [facility] [address] [place], [place] [address]  Basic metabolic panel     Status: Abnormal   Collection Time: 04/03/18  3:00 AM  Result Value Ref Range   Sodium 140 135 - 145 mmol/L   Potassium 3.8 3.5 - 5.1 mmol/L   Chloride 108 101 - 111 mmol/L   CO2 24 22 - 32 mmol/L   Glucose, Bld 99 65 - 99 mg/dL   BUN 14 6 - 20 mg/dL   Creatinine, Ser 0.67 0.44 - 1.00 mg/dL   Calcium 8.6 (L) 8.9 - 10.3 mg/dL   GFR calc non Af Amer >60 >60 mL/min   GFR calc Af Amer >60 >60 mL/min    Comment: (NOTE) The eGFR has been calculated using the CKD EPI equation. This calculation has not been validated in all clinical situations. eGFR's persistently <60 mL/min signify possible Chronic Kidney Disease.    Anion gap 8 5 - 15    Comment: Performed at [facility] [address] [place], [place] [address]  ECG   N/A  Telemetry   Sinus bradycardia - Personally Reviewed  Radiology    Dg Chest 2 View  Result Date: 04/01/2018 CLINICAL DATA:  Chest pain radiating to the left arm. EXAM: CHEST - 2 VIEW COMPARISON:  03/23/2018 FINDINGS: Hyperinflated lungs without pulmonary consolidation or CHF. Heart and mediastinal contours are stable and within normal limits. There is aortic atherosclerosis without aneurysmal dilatation. No effusion or pneumothorax. Redemonstration of upper and lower thoracic  compression fractures without significant change. IMPRESSION: Hyperinflated lungs without active pulmonary disease. Aortic atherosclerosis. Likely remote upper and lower thoracic compression fractures. Electronically Signed   By: Ashley Royalty M.D.   On: 04/01/2018 18:31   Ct Angio Chest Pe W And/or Wo Contrast  Result Date: 04/01/2018 CLINICAL DATA:  Chest pain radiating to the left arm and back. Positive D-dimer. EXAM: CT ANGIOGRAPHY CHEST WITH CONTRAST TECHNIQUE: Multidetector CT imaging of the chest was performed using the standard protocol during bolus administration of intravenous contrast. Multiplanar CT image reconstructions and MIPs were obtained to evaluate the vascular anatomy. CONTRAST:  144m ISOVUE-370 IOPAMIDOL (ISOVUE-370) INJECTION 76% COMPARISON:  None. FINDINGS: Cardiovascular: Contrast injection is sufficient to demonstrate satisfactory opacification of the pulmonary arteries to the segmental level. There is no pulmonary embolus. The main pulmonary artery is within normal limits for size. There is moderateaortic atherosclerosis. Heart size is normal, without pericardial effusion. Mediastinum/Nodes: No mediastinal, hilar or axillary lymphadenopathy. The visualized thyroid and thoracic esophageal course are unremarkable. Lungs/Pleura: No pulmonary nodules or masses. No pleural effusion or pneumothorax. No focal airspace consolidation. No focal pleural abnormality. Upper Abdomen: Contrast bolus timing is not optimized for evaluation of the abdominal organs. Within this limitation, the visualized organs of the upper abdomen are normal. Musculoskeletal: T3 and T7 compression deformities are likely chronic. Review of the MIP images confirms the above findings. IMPRESSION: 1. No pulmonary embolus or other acute thoracic abnormality. 2. Compression deformities at T3 and T7 are likely chronic. 3.  Aortic Atherosclerosis (ICD10-I70.0). Electronically Signed   By: KUlyses JarredM.D.   On: 04/01/2018 21:50     Cardiac Studies   LV EF: 60% -   65%  ------------------------------------------------------------------- Indications:      Chest pain 786.51.  ------------------------------------------------------------------- History:   PMH:   Murmur.  Risk factors:  History of stroke. Current tobacco use. Dyslipidemia.  ------------------------------------------------------------------- Study Conclusions  - Left ventricle: The cavity size was normal. Systolic function was   normal. The estimated ejection fraction was in the range of 60%   to 65%. Wall motion was normal; there were no regional wall   motion abnormalities. Doppler parameters are consistent with   abnormal left ventricular relaxation (grade 1 diastolic   dysfunction). There was no evidence of elevated ventricular   filling pressure by Doppler parameters. - Mitral valve: Calcified annulus. Mildly thickened leaflets .   There was trivial regurgitation. - Left atrium: The atrium was normal in size. - Right ventricle: The cavity size was normal. Wall thickness was   normal. Systolic function was normal. - Right atrium: The atrium was normal in size. - Tricuspid valve: There was mild regurgitation. - Pulmonary arteries: Systolic pressure was within the normal   range. - Inferior vena cava: The vessel was normal in size. - Pericardium, extracardiac: There was no pericardial effusion.  Assessment   1. Principal Problem: 2.   Chest pain 3. Active Problems: 4.   History of stroke 5.   Hyperlipidemia 6.   Plan   1. No further  chest pain - symptoms concerning for unstable angina. Multiple CRF's - echo shows normal LV function. Will plan LHC tomorrow. Keep NPO p MN - would try radial approach as femoral pulses are difficult to palpate.  Time Spent Directly with Patient:  I have spent a total of 25 minutes with the patient reviewing hospital notes, telemetry, EKGs, labs and examining the patient as well as establishing  an assessment and plan that was discussed personally with the patient. > 50% of time was spent in direct patient care.  Length of Stay:  LOS: 0 days   Pixie Casino, MD, [facility], [place] Director of the Advanced Lipid Disorders &  Cardiovascular Risk Reduction Clinic Diplomate of the American Board of Clinical Lipidology Attending Cardiologist  Direct Dial: [phone]  Fax: [phone]  Website:  [email or web address] 04/03/2018, 11:03 AM

## 2018-04-03 NOTE — Plan of Care (Signed)
  Problem: Spiritual Needs Goal: Ability to function at adequate level Outcome: Progressing   Problem: Education: Goal: Knowledge of General Education information will improve Outcome: Progressing   Problem: Health Behavior/Discharge Planning: Goal: Ability to manage health-related needs will improve Outcome: Progressing   Problem: Clinical Measurements: Goal: Ability to maintain clinical measurements within normal limits will improve Outcome: Progressing Goal: Will remain free from infection Outcome: Progressing Goal: Diagnostic test results will improve Outcome: Progressing Goal: Respiratory complications will improve Outcome: Progressing Goal: Cardiovascular complication will be avoided Outcome: Progressing   Problem: Skin Integrity: Goal: Risk for impaired skin integrity will decrease Outcome: Progressing   Problem: Safety: Goal: Ability to remain free from injury will improve Outcome: Progressing   Problem: Pain Managment: Goal: General experience of comfort will improve Outcome: Progressing   Problem: Elimination: Goal: Will not experience complications related to bowel motility Outcome: Progressing Goal: Will not experience complications related to urinary retention Outcome: Progressing

## 2018-04-03 NOTE — Progress Notes (Signed)
PROGRESS NOTE    Mary Nguyen  ZOX:[id number] DOB: 1951/12/01 DOA: 04/01/2018 PCP: Patient, No Pcp Per   Brief Narrative: Patient is a 67 year old female with past medical history of CVA with residual right-sided deficit, coronary artery disease ,blindness, history of peripheral artery disease who presents to the emergency department with left-sided chest pain .She says she has been living in a motel since last few weeks.Patient had been having recurrent chest pain since last few days.  Patient was also noted to be bradycardic on presentation.  Cardiology consulted .Plan for cardiac catheterization on Monday.  Assessment & Plan:   Principal Problem:   Unstable angina (HCC) Active Problems:   History of stroke   Hyperlipidemia   Chest pain:No chest pain since yesterday. Concern for unstable angina on presentation. Troponins negative.  EKG did not show any  ST changes. D-dimer was found to be elevated but CT angiogram did not show any acute PE.  Admitted to telemetry. We will continue to cycle troponin.  Nitroglycerin as needed for chest pain.  Echocardiogram done.Does not show any wall motion abnormality, normal ejection fraction, grade 1 diastolic dysfunction.  Continue aspirin. Cardiology  of planning for cath on Monday.  Patient has high risk for coronary artery disease.  She is  noncompliant with medication because she states she cannot afford.  Bradycardia:Much improved. Might be secondary to her coronary artery disease.  We will continue to monitor in telemetry.  Patient also noted to have frequent PACs/PVCs.  Urinary tract infection: Complains of dysuria. Urine culture grew multiple organisms.  Started on Levaquin.  History of CVA/PAD: Residual right-sided weakness.  Continue Lipitor and aspirin.  Hyperlipidemia: LDL of 173.  Lipitor restarted.  Patient has history of hyperlipidemia but does not take medication because she cannot afford.   DVT prophylaxis: Lovenox Code  Status: Full Family Communication: None Disposition Plan: Likely back to her previous environment after cath   Consultants: Cardiology  Procedures: Echocardiogram  Antimicrobials: Levaquin since 04/01/2018  Subjective: Patient seen and examined the bedside this morning.  No more chest pain since yesterday.  Remains comfortable.  No complaints. Objective: Vitals:   04/02/18 1740 04/02/18 1947 04/03/18 0003 04/03/18 0330  BP:  (!) 100/43 (!) 100/43 (!) 105/31  Pulse:   (!) 55 (!) 56  Resp: Temp:  98.4 F (36.9 C) 98 F (36.7 C) 98 F (36.7 C)  TempSrc:  Oral Oral Oral  SpO2:  97% 95% 96%  Weight:      Height:        Intake/Output Summary (Last 24 hours) at 04/03/2018 1236 Last data filed at 04/02/2018 1800 Gross per 24 hour  Intake 240 ml  Output -  Net 240 ml   Filed Weights   04/01/18 1733 04/02/18 0834  Weight: 49.9 kg (110 lb) 42.2 kg (93 lb 0.6 oz)    Examination:  General exam: Appears calm and comfortable ,Not in distress,thin built HEENT:PERRL,Oral mucosa moist, Ear/Nose normal on gross exam Respiratory system: Bilateral equal air entry, normal vesicular breath sounds, no wheezes or crackles  Cardiovascular system: S1 & S2 heard, RRR. No JVD, murmurs, rubs, gallops or clicks. Gastrointestinal system: Abdomen is nondistended, soft and nontender. No organomegaly or masses felt. Normal bowel sounds heard. Central nervous system: Alert and oriented. No focal neurological deficits. Extremities: No edema, no clubbing ,no cyanosis, distal peripheral pulses palpable. Skin: No rashes, lesions or ulcers,no icterus ,no pallor MSK: Normal muscle bulk,tone ,power Psychiatry: Judgement and insight appear normal.  Mood & affect appropriate.       Data Reviewed: I have personally reviewed following labs and imaging studies  CBC: Recent Labs  Lab 04/01/18 1748 04/02/18 0202  WBC 8.4 7.8  NEUTROABS  --  3.4  HGB 14.1 13.5  HCT 42.7 40.9  MCV 98.6 98.8   PLT 244 238   Basic Metabolic Panel: Recent Labs  Lab 04/01/18 1748 04/02/18 0202 04/03/18 0300  NA 142 139 140  K 3.6 3.7 3.8  CL 107 106 108  CO2 GLUCOSE 105* 102* 99  BUN CREATININE 0.80 0.72 0.67  CALCIUM 9.4 9.1 8.6*   GFR: Estimated Creatinine Clearance: 46.7 mL/min (by C-G formula based on SCr of 0.67 mg/dL). Liver Function Tests: Recent Labs  Lab 04/01/18 1748  AST 18  ALT 14  ALKPHOS 118  BILITOT 0.9  PROT 7.1  ALBUMIN 3.7   Recent Labs  Lab 04/01/18 1748  LIPASE 29   No results for input(s): AMMONIA in the last 168 hours. Coagulation Profile: No results for input(s): INR, PROTIME in the last 168 hours. Cardiac Enzymes: Recent Labs  Lab 04/02/18 0202 04/02/18 1108 04/02/18 1658 04/02/18 2239  TROPONINI <0.03 <0.03 <0.03 <0.03   BNP (last 3 results) No results for input(s): PROBNP in the last 8760 hours. HbA1C: No results for input(s): HGBA1C in the last 72 hours. CBG: No results for input(s): GLUCAP in the last 168 hours. Lipid Profile: Recent Labs    04/02/18 0206  CHOL 227*  HDL 41  LDLCALC 173*  TRIG 65  CHOLHDL 5.5   Thyroid Function Tests: Recent Labs    04/02/18 1402  TSH 1.754   Anemia Panel: No results for input(s): VITAMINB12, FOLATE, FERRITIN, TIBC, IRON, RETICCTPCT in the last 72 hours. Sepsis Labs: No results for input(s): PROCALCITON, LATICACIDVEN in the last 168 hours.  Recent Results (from the past 240 hour(s))  Urine culture     Status: Abnormal   Collection Time: 04/01/18  6:03 PM  Result Value Ref Range Status   Specimen Description URINE, RANDOM  Final   Special Requests   Final    NONE Performed at [facility], [address] [place], [place] [address]    Culture MULTIPLE SPECIES PRESENT, SUGGEST RECOLLECTION (A)  Final   Report Status 04/03/2018 FINAL  Final         Radiology Studies: Dg Chest 2 View  Result Date: 04/01/2018 CLINICAL DATA:  Chest pain radiating to  the left arm. EXAM: CHEST - 2 VIEW COMPARISON:  03/23/2018 FINDINGS: Hyperinflated lungs without pulmonary consolidation or CHF. Heart and mediastinal contours are stable and within normal limits. There is aortic atherosclerosis without aneurysmal dilatation. No effusion or pneumothorax. Redemonstration of upper and lower thoracic compression fractures without significant change. IMPRESSION: Hyperinflated lungs without active pulmonary disease. Aortic atherosclerosis. Likely remote upper and lower thoracic compression fractures. Electronically Signed   By: Tollie Eth M.D.   On: 04/01/2018 18:31   Ct Angio Chest Pe W And/or Wo Contrast  Result Date: 04/01/2018 CLINICAL DATA:  Chest pain radiating to the left arm and back. Positive D-dimer. EXAM: CT ANGIOGRAPHY CHEST WITH CONTRAST TECHNIQUE: Multidetector CT imaging of the chest was performed using the standard protocol during bolus administration of intravenous contrast. Multiplanar CT image reconstructions and MIPs were obtained to evaluate the vascular anatomy. CONTRAST:  ISOVUE-370 IOPAMIDOL (ISOVUE-370) INJECTION 76% COMPARISON:  None. FINDINGS: Cardiovascular: Contrast injection is sufficient to demonstrate satisfactory opacification  of the pulmonary arteries to the segmental level. There is no pulmonary embolus. The main pulmonary artery is within normal limits for size. There is moderateaortic atherosclerosis. Heart size is normal, without pericardial effusion. Mediastinum/Nodes: No mediastinal, hilar or axillary lymphadenopathy. The visualized thyroid and thoracic esophageal course are unremarkable. Lungs/Pleura: No pulmonary nodules or masses. No pleural effusion or pneumothorax. No focal airspace consolidation. No focal pleural abnormality. Upper Abdomen: Contrast bolus timing is not optimized for evaluation of the abdominal organs. Within this limitation, the visualized organs of the upper abdomen are normal. Musculoskeletal: T3 and T7  compression deformities are likely chronic. Review of the MIP images confirms the above findings. IMPRESSION: 1. No pulmonary embolus or other acute thoracic abnormality. 2. Compression deformities at T3 and T7 are likely chronic. 3.  Aortic Atherosclerosis (ICD10-I70.0). Electronically Signed   By: Deatra Robinson M.D.   On: 04/01/2018 21:50        Scheduled Meds: . aspirin EC  81 mg Oral Daily  . atorvastatin  80 mg Oral q1800  . enoxaparin (LOVENOX) injection  40 mg Subcutaneous Daily  . gabapentin  300 mg Oral TID  . levofloxacin  250 mg Oral Daily   Continuous Infusions:   LOS: 0 days    Time spent: 25 mins.More than 50% of that time was spent in counseling and/or coordination of care.      Burnadette Pop, MD [facility] Pager [phone]  If 7PM-7AM, please contact night-coverage [email or web address] Password [facility] 04/03/2018, 12:36 PM

## 2018-04-03 NOTE — Progress Notes (Deleted)
Patient would like to talk to the doctor regarding the procedure tomorrow.

## 2018-04-04 ENCOUNTER — Encounter (HOSPITAL_COMMUNITY): Payer: Self-pay | Admitting: Interventional Cardiology

## 2018-04-04 ENCOUNTER — Inpatient Hospital Stay (HOSPITAL_COMMUNITY)
Admission: EM | Disposition: A | Discharge status: Home Health | Payer: Self-pay | Source: Home / Self Care | Attending: Internal Medicine

## 2018-04-04 DIAGNOSIS — I700 Atherosclerosis of aorta: Secondary | ICD-10-CM | POA: Diagnosis present

## 2018-04-04 DIAGNOSIS — I70298 Other atherosclerosis of native arteries of extremities, other extremity: Secondary | ICD-10-CM | POA: Diagnosis present

## 2018-04-04 DIAGNOSIS — Z8249 Family history of ischemic heart disease and other diseases of the circulatory system: Secondary | ICD-10-CM | POA: Diagnosis not present

## 2018-04-04 DIAGNOSIS — I493 Ventricular premature depolarization: Secondary | ICD-10-CM | POA: Diagnosis present

## 2018-04-04 DIAGNOSIS — Z880 Allergy status to penicillin: Secondary | ICD-10-CM | POA: Diagnosis not present

## 2018-04-04 DIAGNOSIS — H269 Unspecified cataract: Secondary | ICD-10-CM | POA: Diagnosis present

## 2018-04-04 DIAGNOSIS — Z9851 Tubal ligation status: Secondary | ICD-10-CM | POA: Diagnosis not present

## 2018-04-04 DIAGNOSIS — I25118 Atherosclerotic heart disease of native coronary artery with other forms of angina pectoris: Secondary | ICD-10-CM | POA: Diagnosis not present

## 2018-04-04 DIAGNOSIS — Z9114 Patient's other noncompliance with medication regimen: Secondary | ICD-10-CM | POA: Diagnosis not present

## 2018-04-04 DIAGNOSIS — E785 Hyperlipidemia, unspecified: Secondary | ICD-10-CM | POA: Diagnosis present

## 2018-04-04 DIAGNOSIS — I69351 Hemiplegia and hemiparesis following cerebral infarction affecting right dominant side: Secondary | ICD-10-CM | POA: Diagnosis not present

## 2018-04-04 DIAGNOSIS — I208 Other forms of angina pectoris: Secondary | ICD-10-CM | POA: Diagnosis not present

## 2018-04-04 DIAGNOSIS — R64 Cachexia: Secondary | ICD-10-CM | POA: Diagnosis present

## 2018-04-04 DIAGNOSIS — N390 Urinary tract infection, site not specified: Secondary | ICD-10-CM | POA: Diagnosis present

## 2018-04-04 DIAGNOSIS — I200 Unstable angina: Secondary | ICD-10-CM | POA: Diagnosis not present

## 2018-04-04 DIAGNOSIS — I959 Hypotension, unspecified: Secondary | ICD-10-CM | POA: Diagnosis present

## 2018-04-04 DIAGNOSIS — R072 Precordial pain: Secondary | ICD-10-CM | POA: Diagnosis present

## 2018-04-04 DIAGNOSIS — F17200 Nicotine dependence, unspecified, uncomplicated: Secondary | ICD-10-CM | POA: Diagnosis present

## 2018-04-04 DIAGNOSIS — Z9181 History of falling: Secondary | ICD-10-CM | POA: Diagnosis not present

## 2018-04-04 DIAGNOSIS — I25110 Atherosclerotic heart disease of native coronary artery with unstable angina pectoris: Secondary | ICD-10-CM | POA: Diagnosis present

## 2018-04-04 DIAGNOSIS — Z681 Body mass index (BMI) 19 or less, adult: Secondary | ICD-10-CM | POA: Diagnosis not present

## 2018-04-04 DIAGNOSIS — I70203 Unspecified atherosclerosis of native arteries of extremities, bilateral legs: Secondary | ICD-10-CM | POA: Diagnosis present

## 2018-04-04 HISTORY — PX: LEFT HEART CATH AND CORONARY ANGIOGRAPHY: CATH118249

## 2018-04-04 LAB — CBC
HEMATOCRIT: 38.1 % (ref 36.0–46.0)
HEMOGLOBIN: 12.2 g/dL (ref 12.0–15.0)
MCH: 32.4 pg (ref 26.0–34.0)
MCHC: 32 g/dL (ref 30.0–36.0)
MCV: 101.3 fL — ABNORMAL HIGH (ref 78.0–100.0)
Platelets: 205 10*3/uL (ref 150–400)
RBC: 3.76 MIL/uL — ABNORMAL LOW (ref 3.87–5.11)
RDW: 12.8 % (ref 11.5–15.5)
WBC: 6.8 10*3/uL (ref 4.0–10.5)

## 2018-04-04 LAB — CREATININE, SERUM: CREATININE: 0.72 mg/dL (ref 0.44–1.00)

## 2018-04-04 LAB — PROTIME-INR
INR: 1.04
PROTHROMBIN TIME: 13.5 s (ref 11.4–15.2)

## 2018-04-04 SURGERY — LEFT HEART CATH AND CORONARY ANGIOGRAPHY
Anesthesia: LOCAL

## 2018-04-04 MED ORDER — MIDAZOLAM HCL 2 MG/2ML IJ SOLN
INTRAMUSCULAR | Status: DC | PRN
  Administered 2018-04-04: 0.5 mg via INTRAVENOUS

## 2018-04-04 MED ORDER — SODIUM CHLORIDE 0.9 % IV SOLN: 250.0000 mL | INTRAVENOUS | Status: DC | PRN

## 2018-04-04 MED ORDER — SODIUM CHLORIDE 0.9 % IV SOLN: INTRAVENOUS | Status: AC

## 2018-04-04 MED ORDER — MIDAZOLAM HCL 2 MG/2ML IJ SOLN
INTRAMUSCULAR | Status: AC
  Filled 2018-04-04: qty 2

## 2018-04-04 MED ORDER — ONDANSETRON HCL 4 MG/2ML IJ SOLN: 4.0000 mg | Freq: Four times a day (QID) | INTRAMUSCULAR | Status: DC | PRN

## 2018-04-04 MED ORDER — SODIUM CHLORIDE 0.9% FLUSH
3.0000 mL | INTRAVENOUS | Status: DC | PRN
  Administered 2018-04-04: 3 mL via INTRAVENOUS
  Filled 2018-04-04: qty 3

## 2018-04-04 MED ORDER — HEPARIN (PORCINE) IN NACL 2-0.9 UNITS/ML
INTRAMUSCULAR | Status: AC | PRN
  Administered 2018-04-04 (×2): 500 mL

## 2018-04-04 MED ORDER — LIDOCAINE HCL (PF) 1 % IJ SOLN
INTRAMUSCULAR | Status: DC | PRN
  Administered 2018-04-04: 2 mL
  Administered 2018-04-04: 15 mL

## 2018-04-04 MED ORDER — SODIUM CHLORIDE 0.9% FLUSH
3.0000 mL | Freq: Two times a day (BID) | INTRAVENOUS | Status: DC
  Administered 2018-04-05 – 2018-04-06 (×3): 3 mL via INTRAVENOUS

## 2018-04-04 MED ORDER — LIDOCAINE HCL (PF) 1 % IJ SOLN
INTRAMUSCULAR | Status: AC
  Filled 2018-04-04: qty 30

## 2018-04-04 MED ORDER — FENTANYL CITRATE (PF) 100 MCG/2ML IJ SOLN
INTRAMUSCULAR | Status: AC
  Filled 2018-04-04: qty 2

## 2018-04-04 MED ORDER — IOHEXOL 350 MG/ML SOLN
INTRAVENOUS | Status: DC | PRN
  Administered 2018-04-04: 100 mL

## 2018-04-04 MED ORDER — FENTANYL CITRATE (PF) 100 MCG/2ML IJ SOLN
INTRAMUSCULAR | Status: DC | PRN
  Administered 2018-04-04: 25 ug via INTRAVENOUS

## 2018-04-04 MED ORDER — HEPARIN (PORCINE) IN NACL 1000-0.9 UT/500ML-% IV SOLN
INTRAVENOUS | Status: AC
  Filled 2018-04-04: qty 1000

## 2018-04-04 MED ORDER — ISOSORBIDE MONONITRATE ER 30 MG PO TB24
30.0000 mg | ORAL_TABLET | Freq: Every day | ORAL | Status: DC
  Administered 2018-04-04 – 2018-04-06 (×3): 30 mg via ORAL
  Filled 2018-04-04 (×3): qty 1

## 2018-04-04 MED ORDER — ENOXAPARIN SODIUM 30 MG/0.3ML ~~LOC~~ SOLN
30.0000 mg | SUBCUTANEOUS | Status: DC
  Administered 2018-04-06: 30 mg via SUBCUTANEOUS
  Filled 2018-04-04 (×2): qty 0.3

## 2018-04-04 MED ORDER — ACETAMINOPHEN 325 MG PO TABS: 650.0000 mg | ORAL_TABLET | ORAL | Status: DC | PRN

## 2018-04-04 SURGICAL SUPPLY — 8 items
KIT HEART LEFT (KITS) ×2 IMPLANT
PACK CARDIAC CATHETERIZATION (CUSTOM PROCEDURE TRAY) ×2 IMPLANT
SHEATH AVANTI 11CM 5FR (SHEATH) ×2 IMPLANT
SHEATH RAIN 4/5FR (SHEATH) ×2 IMPLANT
TRANSDUCER W/STOPCOCK (MISCELLANEOUS) ×2 IMPLANT
TUBING CIL FLEX 10 FLL-RA (TUBING) ×2 IMPLANT
WIRE AQUATRAK .035X150 ANG (WIRE) ×2 IMPLANT
WIRE EMERALD 3MM-J .035X150CM (WIRE) ×2 IMPLANT

## 2018-04-04 NOTE — Plan of Care (Signed)
  Problem: Spiritual Needs Goal: Ability to function at adequate level Outcome: Progressing   Problem: Education: Goal: Knowledge of General Education information will improve Outcome: Progressing   Problem: Clinical Measurements: Goal: Will remain free from infection Outcome: Progressing

## 2018-04-04 NOTE — CV Procedure (Signed)
CARDIAC CATH   67 year old female presenting with chest pain, negative cardiac markers, and EKG without significant ischemic abnormality.  History of diffuse vascular disease including PAD, CVA, and previously failed cardiac catheterization at [facility] due to absence of access sites.  Using real-time vascular ultrasound and fluoroscopy both femoral arteries were imaged.  Cine fluoroscopy demonstrated heavy common femoral calcification extending into the iliacs bilaterally.  Using ultrasound guidance right common iliac was punctured using a Seldinger needle.  Marked reduction in flow was noted.  Multiple wires including a Glidewire were attempted to traverse the iliofemoral but was unsuccessful.  No palpable right radial pulse is present.  She has contracture in the right arm due to prior stroke.  A faint left radial pulse was palpated.  Real-time vascular ultrasound demonstrated very small ulnar and radial arteries with atherosclerosis.  Targets were too small to hit despite using ultra sound guidance.  The brachial arteries are not palpable.  The procedure was terminated due to absence of access sites.  Further management per treating team.

## 2018-04-04 NOTE — Progress Notes (Signed)
Progress Note  Patient Name: Mary Nguyen Date of Encounter: 04/04/2018  Primary Cardiologist:   No primary care provider on file.   Subjective   She has had no chest pain today.  No SOB.   Inpatient Medications    Scheduled Meds: . aspirin EC  81 mg Oral Daily  . atorvastatin  80 mg Oral q1800  . enoxaparin (LOVENOX) injection  40 mg Subcutaneous Daily  . gabapentin  300 mg Oral TID  . levofloxacin  250 mg Oral Daily  . sodium chloride flush  3 mL Intravenous Q12H   Continuous Infusions: . sodium chloride    . sodium chloride     PRN Meds: sodium chloride, acetaminophen, gi cocktail, morphine injection, nitroGLYCERIN, ondansetron (ZOFRAN) IV, sodium chloride flush   Vital Signs    Vitals:   04/03/18 2002 04/04/18 0031 04/04/18 0628 04/04/18 0742  BP: 128/72 (!) 129/46  (!) 116/50  Pulse: 62 65  (!) 59  Resp: (!) 24  Temp: 98.4 F (36.9 C) (!) 97.5 F (36.4 C)  98.1 F (36.7 C)  TempSrc: Oral Oral  Oral  SpO2: 100% 98%  92%  Weight:   99 lb 10.6 oz (45.2 kg)   Height:        Intake/Output Summary (Last 24 hours) at 04/04/2018 0902 Last data filed at 04/03/2018 2003 Gross per 24 hour  Intake 240 ml  Output 1900 ml  Net -1660 ml   Filed Weights   04/01/18 1733 04/02/18 0834 04/04/18 0628  Weight: 110 lb (49.9 kg) 93 lb 0.6 oz (42.2 kg) 99 lb 10.6 oz (45.2 kg)    Telemetry    NSR - Personally Reviewed  ECG    NA - Personally Reviewed  Physical Exam   GEN: No acute distress.   Neck: No  JVD Cardiac: RRR, no murmurs, rubs, or gallops.  Respiratory: Clear  to auscultation bilaterally. GI: Soft, nontender, non-distended  MS: No  edema; No deformity. Psych: Normal affect   Labs    Chemistry Recent Labs  Lab 04/01/18 1748 04/02/18 0202 04/03/18 0300  NA 142 139 140  K 3.6 3.7 3.8  CL 107 106 108  CO2 GLUCOSE 105* 102* 99  BUN CREATININE 0.80 0.72 0.67  CALCIUM 9.4 9.1 8.6*  PROT 7.1  --   --   ALBUMIN  3.7  --   --   AST 18  --   --   ALT 14  --   --   ALKPHOS 118  --   --   BILITOT 0.9  --   --   GFRNONAA >60 >60 >60  GFRAA >60 >60 >60  ANIONGAP Hematology Recent Labs  Lab 04/01/18 1748 04/02/18 0202  WBC 8.4 7.8  RBC 4.33 4.14  HGB 14.1 13.5  HCT 42.7 40.9  MCV 98.6 98.8  MCH 32.6 32.6  MCHC 33.0 33.0  RDW 12.5 12.6  PLT 244 238    Cardiac Enzymes Recent Labs  Lab 04/02/18 0202 04/02/18 1108 04/02/18 1658 04/02/18 2239  TROPONINI <0.03 <0.03 <0.03 <0.03    Recent Labs  Lab 04/01/18 1759  TROPIPOC 0.00     BNPNo results for input(s): BNP, PROBNP in the last 168 hours.   DDimer  Recent Labs  Lab 04/01/18 1748  DDIMER 0.90*     Radiology    No results found.  Cardiac Studies   ECHO:  04/02/18  Study Conclusions  - Left ventricle: The cavity size was normal. Systolic function was normal. The estimated ejection fraction was in the range of 60% to 65%. Wall motion was normal; there were no regional wall motion abnormalities. Doppler parameters are consistent with abnormal left ventricular relaxation (grade 1 diastolic dysfunction). There was no evidence of elevated ventricular filling pressure by Doppler parameters. - Mitral valve: Calcified annulus. Mildly thickened leaflets . There was trivial regurgitation. - Left atrium: The atrium was normal in size. - Right ventricle: The cavity size was normal. Wall thickness was normal. Systolic function was normal. - Right atrium: The atrium was normal in size. - Tricuspid valve: There was mild regurgitation. - Pulmonary arteries: Systolic pressure was within the normal range. - Inferior vena cava: The vessel was normal in size. - Pericardium, extracardiac: There was no pericardial effusion.    Patient Profile     67 y.o. female with a hx of patient-reported CAD/PVD/bradycardia, multiple strokes, blindness, cataracts, untreated HLD, heart murmur, skipped heart  beats,who is being seen for the evaluation of chest painat the request of Dr. Katrinka Blazing.   Assessment & Plan    CHEST PAIN:  Plan was for left heart cath.  Unfortunately, Dr. Katrinka Blazing was not able to obtain vascular access.  Cardiac cath is not possible and should not be attempted in the future.  We can continue to manage medically for presumed angina.  I am going to start a low dose of Imdur.   DYSLIPIDEMIA:    LDL 173.  Lipitor was increased this admission. Plan is to repeat lipid profile in 8 weeks.   For questions or updates, please contact [facility] Please consult [email or web address] for contact info under Cardiology/STEMI.   Signed, Rollene Rotunda, MD  04/04/2018, 9:02 AM

## 2018-04-04 NOTE — Progress Notes (Signed)
Patient is blind and is waiting for son to come back to the hospital before signing consent form; On coming RN notified.

## 2018-04-04 NOTE — Interval H&P Note (Signed)
Cath Lab Visit (complete for each Cath Lab visit)  Clinical Evaluation Leading to the Procedure:   ACS: No.  Non-ACS:    Anginal Classification: CCS III  Anti-ischemic medical therapy: Minimal Therapy (1 class of medications)  Non-Invasive Test Results: No non-invasive testing performed  Prior CABG: No previous CABG      History and Physical Interval Note:  04/04/2018 9:47 AM  Mary Nguyen  has presented today for surgery, with the diagnosis of unstable angina  The various methods of treatment have been discussed with the patient and family. After consideration of risks, benefits and other options for treatment, the patient has consented to  Procedure(s): LEFT HEART CATH AND CORONARY ANGIOGRAPHY (N/A) as a surgical intervention .  The patient's history has been reviewed, patient examined, no change in status, stable for surgery.  I have reviewed the patient's chart and labs.  Questions were answered to the patient's satisfaction.     Lyn Records III

## 2018-04-04 NOTE — Progress Notes (Signed)
Chaplain came by for an A.D consultation however, Pt went to CT and was not available when Chaplain came by--RN said it will be better to come back after 12:00pm. Chaplain plans to do so.   Thanks,

## 2018-04-04 NOTE — Progress Notes (Signed)
PROGRESS NOTE    Mary Nguyen  HYW:[id number] DOB: 15-Apr-1951 DOA: 04/01/2018 PCP: Patient, No Pcp Per   Brief Narrative: Patient is a 67 year old female with past medical history of CVA with residual right-sided deficit, coronary artery disease ,blindness, history of peripheral artery disease who presents to the emergency department with left-sided chest pain .She says she has been living in a motel since last few weeks.Patient had been having recurrent chest pain since last few days.  Patient was also noted to be bradycardic on presentation.  Cardiology consulted . She was planned for left heart catheterization today but it was unable to obtain vascular access.  Cardiac cath not possible.  Plan for future.  Plan is to continue medical treatment for now.  Assessment & Plan:   Principal Problem:   Unstable angina (HCC) Active Problems:   History of stroke   Hyperlipidemia   Precordial pain   Chest pain:No chest pain currently .Concern for unstable angina on presentation. Troponins negative.  EKG did not show any  ST changes. D-dimer was found to be elevated but CT angiogram did not show any acute PE.  Admitted to telemetry. Nitroglycerin as needed for chest pain.  Echocardiogram done.Does not show any wall motion abnormality, normal ejection fraction, grade 1 diastolic dysfunction.  Continue aspirin. She was planned for left heart catheterization today but it was unable to obtain vascular access.  Cardiac cath not possible.  Plan for future.  Plan is to continue medical treatment for now.Started on Imdur.  Bradycardia:Much improved. Might be secondary to her coronary artery disease.  We will continue to monitor in telemetry.  Patient also noted to have frequent PACs/PVCs.  Urinary tract infection: Complains of dysuria. Urine culture grew multiple organisms.  Started on Levaquin.  History of CVA/PAD: Residual right-sided weakness.  Continue Lipitor and aspirin.Patient can hardly walk  and uses wheelchair. Will request for PT.  Hyperlipidemia: LDL of 173.  Lipitor restarted.  Patient has history of hyperlipidemia but does not take medication because she cannot afford.  Plan to repeat lipid profile in 8 weeks.   DVT prophylaxis: Lovenox Code Status: Full Family Communication: None Disposition Plan: Likely back to her previous environment after medical optimization for unstable angina   Consultants: Cardiology  Procedures: Echocardiogram  Antimicrobials: Levaquin since 04/01/2018  Subjective: Patient seen and examined the bedside this afternoon.  Cardiac cath was unsuccessful today due to lack of access.Denied any chest pain. Objective: Vitals:   04/04/18 1130 04/04/18 1200 04/04/18 1233 04/04/18 1352  BP: (!) 151/137 (!) 121/46 (!) 121/46   Pulse:   62   Resp: 11 10    Temp:      TempSrc:      SpO2:   94%   Weight:    43.3 kg (95 lb 7.4 oz)  Height:        Intake/Output Summary (Last 24 hours) at 04/04/2018 1530 Last data filed at 04/03/2018 2003 Gross per 24 hour  Intake 240 ml  Output 1900 ml  Net -1660 ml   Filed Weights   04/02/18 0834 04/04/18 0628 04/04/18 1352  Weight: 42.2 kg (93 lb 0.6 oz) 45.2 kg (99 lb 10.6 oz) 43.3 kg (95 lb 7.4 oz)    Examination:  General exam: Appears calm and comfortable ,Not in distress,thin built HEENT:PERRL,Oral mucosa moist, Ear/Nose normal on gross exam Respiratory system: Bilateral equal air entry, normal vesicular breath sounds, no wheezes or crackles  Cardiovascular system: S1 & S2 heard, RRR. No JVD, murmurs, rubs, gallops  or clicks. Gastrointestinal system: Abdomen is nondistended, soft and nontender. No organomegaly or masses felt. Normal bowel sounds heard. Central nervous system: Alert and oriented. No focal neurological deficits. Extremities: No edema, no clubbing ,no cyanosis, distal peripheral pulses palpable. Skin: No rashes, lesions or ulcers,no icterus ,no pallor    Data Reviewed: I have  personally reviewed following labs and imaging studies  CBC: Recent Labs  Lab 04/01/18 1748 04/02/18 0202 04/04/18 1043  WBC 8.4 7.8 6.8  NEUTROABS  --  3.4  --   HGB 14.1 13.5 12.2  HCT 42.7 40.9 38.1  MCV 98.6 98.8 101.3*  PLT 244 238 205   Basic Metabolic Panel: Recent Labs  Lab 04/01/18 1748 04/02/18 0202 04/03/18 0300 04/04/18 1043  NA 142 139 140  --   K 3.6 3.7 3.8  --   CL 107 106 108  --   CO2 --   GLUCOSE 105* 102* 99  --   BUN --   CREATININE 0.80 0.72 0.67 0.72  CALCIUM 9.4 9.1 8.6*  --    GFR: Estimated Creatinine Clearance: 47.9 mL/min (by C-G formula based on SCr of 0.72 mg/dL). Liver Function Tests: Recent Labs  Lab 04/01/18 1748  AST 18  ALT 14  ALKPHOS 118  BILITOT 0.9  PROT 7.1  ALBUMIN 3.7   Recent Labs  Lab 04/01/18 1748  LIPASE 29   No results for input(s): AMMONIA in the last 168 hours. Coagulation Profile: Recent Labs  Lab 04/04/18 0247  INR 1.04   Cardiac Enzymes: Recent Labs  Lab 04/02/18 0202 04/02/18 1108 04/02/18 1658 04/02/18 2239  TROPONINI <0.03 <0.03 <0.03 <0.03   BNP (last 3 results) No results for input(s): PROBNP in the last 8760 hours. HbA1C: No results for input(s): HGBA1C in the last 72 hours. CBG: No results for input(s): GLUCAP in the last 168 hours. Lipid Profile: Recent Labs    04/02/18 0206  CHOL 227*  HDL 41  LDLCALC 173*  TRIG 65  CHOLHDL 5.5   Thyroid Function Tests: Recent Labs    04/02/18 1402  TSH 1.754   Anemia Panel: No results for input(s): VITAMINB12, FOLATE, FERRITIN, TIBC, IRON, RETICCTPCT in the last 72 hours. Sepsis Labs: No results for input(s): PROCALCITON, LATICACIDVEN in the last 168 hours.  Recent Results (from the past 240 hour(s))  Urine culture     Status: Abnormal   Collection Time: 04/01/18  6:03 PM  Result Value Ref Range Status   Specimen Description URINE, RANDOM  Final   Special Requests   Final    NONE Performed at [facility], [address] [place], [place] [address]    Culture MULTIPLE SPECIES PRESENT, SUGGEST RECOLLECTION (A)  Final   Report Status 04/03/2018 FINAL  Final         Radiology Studies: No results found.      Scheduled Meds: . aspirin EC  81 mg Oral Daily  . atorvastatin  80 mg Oral q1800  . [START ON 04/05/2018] enoxaparin (LOVENOX) injection  30 mg Subcutaneous Q24H  . gabapentin  300 mg Oral TID  . isosorbide mononitrate  30 mg Oral Daily  . levofloxacin  250 mg Oral Daily  . sodium chloride flush  3 mL Intravenous Q12H   Continuous Infusions: . sodium chloride       LOS: 0 days    Time spent: 25 mins.More than 50% of that time was spent in counseling and/or coordination  of care.      Burnadette Pop, MD [facility] Pager [phone]  If 7PM-7AM, please contact night-coverage [email or web address] Password [email or web address] 04/04/2018, 3:30 PM

## 2018-04-05 MED ORDER — IBUPROFEN 200 MG PO TABS
200.0000 mg | ORAL_TABLET | ORAL | Status: DC | PRN
  Administered 2018-04-06: 200 mg via ORAL
  Filled 2018-04-05: qty 1

## 2018-04-05 MED FILL — Heparin Sod (Porcine)-NaCl IV Soln 1000 Unit/500ML-0.9%: INTRAVENOUS | Qty: 1000 | Status: AC

## 2018-04-05 NOTE — Progress Notes (Addendum)
Progress Note  Patient Name: Mary Nguyen Date of Encounter: 04/05/2018  Primary Cardiologist: Chrystie Nose, MD   Subjective   Patient reports brief episode of CP overnight which resolved spontaneously. Denies SOB or palpitations.   Inpatient Medications    Scheduled Meds: . aspirin EC  81 mg Oral Daily  . atorvastatin  80 mg Oral q1800  . enoxaparin (LOVENOX) injection  30 mg Subcutaneous Q24H  . gabapentin  300 mg Oral TID  . isosorbide mononitrate  30 mg Oral Daily  . levofloxacin  250 mg Oral Daily  . sodium chloride flush  3 mL Intravenous Q12H   Continuous Infusions: . sodium chloride     PRN Meds: sodium chloride, acetaminophen, gi cocktail, morphine injection, nitroGLYCERIN, ondansetron (ZOFRAN) IV, sodium chloride flush   Vital Signs    Vitals:   04/04/18 1352 04/04/18 2029 04/05/18 0448 04/05/18 0757  BP:  (!) 117/52 95/69 129/89  Pulse:  75 73   Resp:  Temp:  97.8 F (36.6 C) 97.6 F (36.4 C) 98 F (36.7 C)  TempSrc:  Oral Oral Oral  SpO2:    95%  Weight: 95 lb 7.4 oz (43.3 kg)     Height:        Intake/Output Summary (Last 24 hours) at 04/05/2018 1006 Last data filed at 04/04/2018 1917 Gross per 24 hour  Intake 0 ml  Output 900 ml  Net -900 ml   Filed Weights   04/02/18 0834 04/04/18 0628 04/04/18 1352  Weight: 93 lb 0.6 oz (42.2 kg) 99 lb 10.6 oz (45.2 kg) 95 lb 7.4 oz (43.3 kg)    Telemetry    NSR with intermittent PACs/bygeminy  - Personally Reviewed  Physical Exam   GEN: Thin appearing female laying in bed in no acute distress.   Neck: No JVD Cardiac: RRR, +murmur (LUSB), no rubs or gallops.  Respiratory: Clear to auscultation bilaterally, no wheezes/ rales/ rhonchi GI: NABS, Soft, nontender, non-distended  MS: No edema; No deformity. Neuro:  Nonfocal, moving all extremities spontaneously Psych: Normal affect   Labs    Chemistry Recent Labs  Lab 04/01/18 1748 04/02/18 0202 04/03/18 0300 04/04/18 1043  NA  142 139 140  --   K 3.6 3.7 3.8  --   CL 107 106 108  --   CO2 --   GLUCOSE 105* 102* 99  --   BUN --   CREATININE 0.80 0.72 0.67 0.72  CALCIUM 9.4 9.1 8.6*  --   PROT 7.1  --   --   --   ALBUMIN 3.7  --   --   --   AST 18  --   --   --   ALT 14  --   --   --   ALKPHOS 118  --   --   --   BILITOT 0.9  --   --   --   GFRNONAA >60 >60 >60 >60  GFRAA >60 >60 >60 >60  ANIONGAP --      Hematology Recent Labs  Lab 04/01/18 1748 04/02/18 0202 04/04/18 1043  WBC 8.4 7.8 6.8  RBC 4.33 4.14 3.76*  HGB 14.1 13.5 12.2  HCT 42.7 40.9 38.1  MCV 98.6 98.8 101.3*  MCH 32.6 32.6 32.4  MCHC 33.0 33.0 32.0  RDW 12.5 12.6 12.8  PLT 244 238 205    Cardiac Enzymes Recent Labs  Lab 04/02/18  0202 04/02/18 1108 04/02/18 1658 04/02/18 2239  TROPONINI <0.03 <0.03 <0.03 <0.03    Recent Labs  Lab 04/01/18 1759  TROPIPOC 0.00     BNPNo results for input(s): BNP, PROBNP in the last 168 hours.   DDimer  Recent Labs  Lab 04/01/18 1748  DDIMER 0.90*     Radiology    No results found.  Cardiac Studies   Cardiac catheterization 04/04/18:   67 year old female presenting with chest pain, negative cardiac markers, and EKG without significant ischemic abnormality.  History of diffuse vascular disease including PAD, CVA, and previously failed cardiac catheterization at [facility] due to absence of access sites.  Using real-time vascular ultrasound and fluoroscopy both femoral arteries were imaged.  Cine fluoroscopy demonstrated heavy common femoral calcification extending into the iliacs bilaterally.  Using ultrasound guidance right common iliac was punctured using a Seldinger needle.  Marked reduction in flow was noted.  Multiple wires including a Glidewire were attempted to traverse the iliofemoral but was unsuccessful.  No palpable right radial pulse is present.  She has contracture in the right arm due to prior stroke.  A faint left radial pulse was  palpated.  Real-time vascular ultrasound demonstrated very small ulnar and radial arteries with atherosclerosis.  Targets were too small to hit despite using ultra sound guidance.  The brachial arteries are not palpable.  The procedure was terminated due to absence of access sites.  Further management per treating team.  Echocardiogram:  04/02/18  Study Conclusions  - Left ventricle: The cavity size was normal. Systolic function was normal. The estimated ejection fraction was in the range of 60% to 65%. Wall motion was normal; there were no regional wall motion abnormalities. Doppler parameters are consistent with abnormal left ventricular relaxation (grade 1 diastolic dysfunction). There was no evidence of elevated ventricular filling pressure by Doppler parameters. - Mitral valve: Calcified annulus. Mildly thickened leaflets . There was trivial regurgitation. - Left atrium: The atrium was normal in size. - Right ventricle: The cavity size was normal. Wall thickness was normal. Systolic function was normal. - Right atrium: The atrium was normal in size. - Tricuspid valve: There was mild regurgitation. - Pulmonary arteries: Systolic pressure was within the normal range. - Inferior vena cava: The vessel was normal in size. - Pericardium, extracardiac: There was no pericardial effusion.  Patient Profile   67 y.o. female with a hx of patient-reported CAD, PVD, bradycardia, multiple strokes, blindness, cataracts, untreated HLD, heart murmur, skipped heart beats,who is being seen for the evaluation of chest painat the request of Dr. Katrinka Blazing.  Assessment & Plan    1. Chest pain in patient with CAD: patient has had multiple attempts at [facility] for evaluation of chest pain. Unfortunately, attempt yesterday had to be aborted due to poor vascular access. Decision made to pursue medical management of angina. She was started on low-dose imdur yesterday. - Per Dr. Antoine Poche, do  not recommend future attempts at cardiac catheterization.  - Continue imdur, ASA, and atorvastatin - Has had intermittent bradycardia and hypotension so beta blocker not started - Can titrate imdur outpatient as tolerated  2. Dyslipidemia: LDL 173 this admission; goal <70. Started on atorvastatin  daily - Continue atorvastatin and repeat lipid profile in 8 weeks  3. PVD: difficult venous access. With c/o LE pain - Continue ASA and atorvastatin  Will arrange outpatient follow-up with Cardiology.    For questions or updates, please contact [facility] Please consult [email or web address] for contact info under Cardiology/STEMI.  Signed, Beatriz Stallion, PA-C  04/05/2018, 10:06 AM   [phone]  History and all data above reviewed.  Patient examined.  I agree with the findings as above.  No chest pain currently.  We are managing her medically.  She complaints of aches and pains and is being evaluated by PT.  The patient exam reveals COR:RRR  ,  Lungs: Clear  ,  Abd: Positive bowel sounds, no rebound no guarding, Ext No edema  .  All available labs, radiology testing, previous records reviewed. Agree with documented assessment and plan. No further cardiac work up.  Would be OK to discharge from our standpoint once her disposition issues are worked out.    Fayrene Fearing [place]  11:02 AM  04/05/2018

## 2018-04-05 NOTE — Clinical Social Work Note (Signed)
Clinical Social Work Assessment  Patient Details  Name: Mary Nguyen MRN: [id number] Date of Birth: 09-15-1951  Date of referral:  04/05/18               Reason for consult:  Discharge Planning, Facility Placement                Permission sought to share information with:  Family Supports, Case Manager Permission granted to share information::  Yes, Verbal Permission Granted  Name::        Agency::     Relationship::     Contact Information:     Housing/Transportation Living arrangements for the past 2 months:  Hotel/Motel Source of Information:  Patient Patient Interpreter Needed:  None Criminal Activity/Legal Involvement Pertinent to Current Situation/Hospitalization:  No - Comment as needed Significant Relationships:  Adult Children Lives with:  Self, Adult Children Do you feel safe going back to the place where you live?  Yes Need for family participation in patient care:  Yes (Comment)  Care giving concerns: Patient had a sleeping female and  Young child in the room during assessment. Patient stated her adult son would better be able to do assessment with CSW  Social Worker assessment / plan: CSW met patient at bedside to offer support and discuss discharge plan. CSW was unable to verify patients home situation due to patient defaulting CSW questions to adult son. Patient stated that son was outside taking a cigarette break. Patient stated she has been to a SNF in [place] in the past but will have son make decision on wether or not she should go back to  A SNF in the area. CSW will follow back up with patient once son is at bedside  Employment status:  Disabled (Comment on whether or not currently receiving Disability) Insurance information:  Medicare PT Recommendations:  [place] / Referral to community resources:  [place]  Patient/Family's Response to care:  Patient has support from family   Patient/Family's Understanding  of and Emotional Response to Diagnosis, Current Treatment, and Prognosis:  CSW will continue to follow patient for a clear discharge plan  Emotional Assessment Appearance:  Appears older than stated age Attitude/Demeanor/Rapport:  Engaged Affect (typically observed):  Accepting Orientation:  Oriented to Self, Oriented to Situation, Oriented to Place, Oriented to  Time Alcohol / Substance use:  Not Applicable Psych involvement (Current and /or in the community):     Discharge Needs  Concerns to be addressed:  Discharge Planning Concerns, Homelessness(not sure if pt is homeless) Readmission within the last 30 days:  Yes Current discharge risk:  Dependent with Mobility Barriers to Discharge:  Continued Medical Work up, No SNF bed   Mary Neighbors, LCSW 04/05/2018, 3:32 PM

## 2018-04-05 NOTE — Progress Notes (Addendum)
PROGRESS NOTE    Mary Nguyen  WUJ:[id number] DOB: 09-08-1951 DOA: 04/01/2018 PCP: Patient, No Pcp Per   Brief Narrative: Patient is a 67 year old female with past medical history of CVA with residual right-sided deficit, coronary artery disease ,blindness, history of peripheral artery disease who presents to the emergency department with left-sided chest pain .She says she has been living in a motel since last few weeks.Patient had been having recurrent chest pain since last few days.  Patient was also noted to be bradycardic on presentation.  Cardiology consulted . She was planned for left heart catheterization but it was unable to obtain vascular access.  Cardiac cath was  not possible.   Plan is to continue medical treatment for now.  She was also evaluated by physical therapy and recommended skilled nursing facility on discharge.SW consulted.  Assessment & Plan:   Principal Problem:   Unstable angina (HCC) Active Problems:   History of stroke   Hyperlipidemia   Precordial pain   Angina at rest [facility])   Chest pain:No chest pain currently .Concern for unstable angina on presentation. Troponins negative.  EKG did not show any  ST changes. D-dimer was found to be elevated but CT angiogram did not show any acute PE.  Admitted to telemetry. Nitroglycerin as needed for chest pain.  Echocardiogram done.Does not show any wall motion abnormality, normal ejection fraction, grade 1 diastolic dysfunction.  Continue aspirin. She was planned for left heart catheterization today but it was unable to obtain vascular access.  Cardiac cath not possible. Plan is to continue medical treatment for now.Started on Imdur.  Continue aspirin and Lipitor. She should follow-up with cardiology on discharge.  Bradycardia:Much improved. Might be secondary to her coronary artery disease.  We will continue to monitor in telemetry.  Patient also noted to have frequent PACs/PVCs.  Beta-blocker not started due to  bradycardic episodes.  Urinary tract infection: Complained of dysuria. Urine culture grew multiple organisms.  Started on Levaquin.  History of CVA/PAD: Residual right-sided weakness.  Continue Lipitor and aspirin.Patient has difficulty ambulation. Uses on/off wheelchair.  Physical therapy evaluated the patient and recommended skilled nursing facility.  Social worker consulted.  Hyperlipidemia: LDL of 173.  Lipitor restarted.  Patient has history of hyperlipidemia but does not take medication because she cannot afford.  Plan to repeat lipid profile in 8 weeks.   DVT prophylaxis: Lovenox Code Status: Full Family Communication: None Disposition Plan: SNF after the bed is available  Consultants: Cardiology  Procedures: Echocardiogram  Antimicrobials: Levaquin Day 4  Subjective: Patient seen and examined the bedside this afternoon.  Remains comfortable.Denied any chest pain.  Objective: Vitals:   04/04/18 2029 04/05/18 0448 04/05/18 0757 04/05/18 1217  BP: (!) 117/52 95/69 129/89 118/90  Pulse: 75 73  74  Resp: Temp: 97.8 F (36.6 C) 97.6 F (36.4 C) 98 F (36.7 C) 98 F (36.7 C)  TempSrc: Oral Oral Oral Oral  SpO2:   95% 95%  Weight:      Height:        Intake/Output Summary (Last 24 hours) at 04/05/2018 1431 Last data filed at 04/05/2018 1114 Gross per 24 hour  Intake 3 ml  Output 1400 ml  Net -1397 ml   Filed Weights   04/02/18 0834 04/04/18 0628 04/04/18 1352  Weight: 42.2 kg (93 lb 0.6 oz) 45.2 kg (99 lb 10.6 oz) 43.3 kg (95 lb 7.4 oz)    Examination:  General exam: Appears calm and comfortable ,Not in  distress,thin/frial HEENT:Blind,Oral mucosa moist, Ear/Nose normal on gross exam Respiratory system: Bilateral equal air entry, normal vesicular breath sounds, no wheezes or crackles  Cardiovascular system: S1 & S2 heard, RRR. No JVD, murmurs, rubs, gallops or clicks. Gastrointestinal system: Abdomen is nondistended, soft and nontender. No  organomegaly or masses felt. Normal bowel sounds heard. Central nervous system: Alert and oriented.  Weakness on the right side due to previous stroke Extremities: No edema, no clubbing ,no cyanosis, distal peripheral pulses palpable. Skin: No rashes, lesions or ulcers,no icterus ,no pallor   Data Reviewed: I have personally reviewed following labs and imaging studies  CBC: Recent Labs  Lab 04/01/18 1748 04/02/18 0202 04/04/18 1043  WBC 8.4 7.8 6.8  NEUTROABS  --  3.4  --   HGB 14.1 13.5 12.2  HCT 42.7 40.9 38.1  MCV 98.6 98.8 101.3*  PLT 244 238 205   Basic Metabolic Panel: Recent Labs  Lab 04/01/18 1748 04/02/18 0202 04/03/18 0300 04/04/18 1043  NA 142 139 140  --   K 3.6 3.7 3.8  --   CL 107 106 108  --   CO2 --   GLUCOSE 105* 102* 99  --   BUN --   CREATININE 0.80 0.72 0.67 0.72  CALCIUM 9.4 9.1 8.6*  --    GFR: Estimated Creatinine Clearance: 47.9 mL/min (by C-G formula based on SCr of 0.72 mg/dL). Liver Function Tests: Recent Labs  Lab 04/01/18 1748  AST 18  ALT 14  ALKPHOS 118  BILITOT 0.9  PROT 7.1  ALBUMIN 3.7   Recent Labs  Lab 04/01/18 1748  LIPASE 29   No results for input(s): AMMONIA in the last 168 hours. Coagulation Profile: Recent Labs  Lab 04/04/18 0247  INR 1.04   Cardiac Enzymes: Recent Labs  Lab 04/02/18 0202 04/02/18 1108 04/02/18 1658 04/02/18 2239  TROPONINI <0.03 <0.03 <0.03 <0.03   BNP (last 3 results) No results for input(s): PROBNP in the last 8760 hours. HbA1C: No results for input(s): HGBA1C in the last 72 hours. CBG: No results for input(s): GLUCAP in the last 168 hours. Lipid Profile: No results for input(s): CHOL, HDL, LDLCALC, TRIG, CHOLHDL, LDLDIRECT in the last 72 hours. Thyroid Function Tests: No results for input(s): TSH, T4TOTAL, FREET4, T3FREE, THYROIDAB in the last 72 hours. Anemia Panel: No results for input(s): VITAMINB12, FOLATE, FERRITIN, TIBC, IRON, RETICCTPCT in the last  72 hours. Sepsis Labs: No results for input(s): PROCALCITON, LATICACIDVEN in the last 168 hours.  Recent Results (from the past 240 hour(s))  Urine culture     Status: Abnormal   Collection Time: 04/01/18  6:03 PM  Result Value Ref Range Status   Specimen Description URINE, RANDOM  Final   Special Requests   Final    NONE Performed at [facility], [address] [place], [place] [address]    Culture MULTIPLE SPECIES PRESENT, SUGGEST RECOLLECTION (A)  Final   Report Status 04/03/2018 FINAL  Final         Radiology Studies: No results found.      Scheduled Meds: . aspirin EC  81 mg Oral Daily  . atorvastatin  80 mg Oral q1800  . enoxaparin (LOVENOX) injection  30 mg Subcutaneous Q24H  . gabapentin  300 mg Oral TID  . isosorbide mononitrate  30 mg Oral Daily  . levofloxacin  250 mg Oral Daily  . sodium chloride flush  3 mL Intravenous Q12H   Continuous  Infusions: . sodium chloride       LOS: 1 day    Time spent: 25 mins.More than 50% of that time was spent in counseling and/or coordination of care.      Burnadette Pop, MD [facility] Pager [phone]  If 7PM-7AM, please contact night-coverage [email or web address] Password [facility] 04/05/2018, 2:31 PM

## 2018-04-05 NOTE — [place] FL2 (Signed)
[place] MEDICAID FL2 LEVEL OF CARE SCREENING TOOL     IDENTIFICATION  Patient Name: Mary Nguyen Birthdate: 17-Jul-1951 Sex: female Admission Date (Current Location): 04/01/2018  [facility] and [place] Number:  Producer, television/film/video and Address:  [facility], [address] [place], [place] [address]      Provider Number: [id number]  Attending Physician Name and Address:  Burnadette Pop, MD  Relative Name and Phone Number:  Mary Nguyen, [phone]    Current Level of Care: Hospital Recommended Level of Care: Skilled Nursing Facility Prior Approval Number:    Date Approved/Denied:   PASRR Number: [id number]  Discharge Plan: SNF    Current Diagnoses: Patient Active Problem List   Diagnosis Date Noted  . Angina at rest [facility]) 04/04/2018  . Precordial pain   . History of stroke 04/02/2018  . Hyperlipidemia 04/02/2018  . Unstable angina (HCC) 04/01/2018    Orientation RESPIRATION BLADDER Height & Weight     Self, Time, Situation, Place  Normal External catheter Weight: 95 lb 7.4 oz (43.3 kg)(pillow on bed ONLY) Height:   (157.5 cm)(estimated)  BEHAVIORAL SYMPTOMS/MOOD NEUROLOGICAL BOWEL NUTRITION STATUS      Continent Diet(Heart Healthy)  AMBULATORY STATUS COMMUNICATION OF NEEDS Skin   Limited Assist Verbally                         Personal Care Assistance Level of Assistance  Bathing, Feeding, Dressing Bathing Assistance: Maximum assistance Feeding assistance: Independent Dressing Assistance: Maximum assistance     Functional Limitations Info  Sight, Hearing, Speech Sight Info: Impaired(pt is blind) Hearing Info: Adequate Speech Info: Adequate    SPECIAL CARE FACTORS FREQUENCY  PT (By licensed PT), OT (By licensed OT)     PT Frequency: 5x wk OT Frequency: 5x wk            Contractures Contractures Info: Not present    Additional Factors Info  Code Status, Allergies Code Status Info: Full  Code Allergies Info: Penicillins           Current Medications (04/05/2018):  This is the current hospital active medication list Current Facility-Administered Medications  Medication Dose Route Frequency Provider Last Rate Last Dose  . 0.9 %  sodium chloride infusion  250 mL Intravenous PRN Lyn Records, MD      . acetaminophen (TYLENOL) tablet 650 mg  650 mg Oral Q4H PRN Lyn Records, MD   650 mg at 04/05/18 0756  . aspirin EC tablet 81 mg  81 mg Oral Daily Lyn Records, MD   81 mg at 04/05/18 1100  . atorvastatin (LIPITOR) tablet 80 mg  80 mg Oral q1800 Lyn Records, MD   80 mg at 04/04/18 1754  . enoxaparin (LOVENOX) injection 30 mg  30 mg Subcutaneous Q24H Lyn Records, MD      . gabapentin (NEURONTIN) capsule 300 mg  300 mg Oral TID Lyn Records, MD   300 mg at 04/05/18 1100  . gi cocktail (Maalox,Lidocaine,Donnatal)  30 mL Oral QID PRN Lyn Records, MD   30 mL at 04/02/18 0039  . ibuprofen (ADVIL,MOTRIN) tablet 200 mg  200 mg Oral Q4H PRN Burnadette Pop, MD      . isosorbide mononitrate (IMDUR) 24 hr tablet 30 mg  30 mg Oral Daily Rollene Rotunda, MD   30 mg at 04/05/18 1100  . levofloxacin (LEVAQUIN) tablet 250 mg  250 mg Oral Daily Verdis Prime  W, MD   250 mg at 04/05/18 1100  . morphine 4 MG/ML injection 2 mg  2 mg Intravenous Q2H PRN Lyn Records, MD   2 mg at 04/05/18 1101  . nitroGLYCERIN (NITROSTAT) SL tablet 0.4 mg  0.4 mg Sublingual Q5 min PRN Lyn Records, MD   0.4 mg at 04/02/18 1133  . ondansetron (ZOFRAN) injection 4 mg  4 mg Intravenous Q4H PRN Lyn Records, MD   4 mg at 04/05/18 0347  . sodium chloride flush (NS) 0.9 % injection 3 mL  3 mL Intravenous Q12H Lyn Records, MD   3 mL at 04/05/18 1102  . sodium chloride flush (NS) 0.9 % injection 3 mL  3 mL Intravenous PRN Lyn Records, MD   3 mL at 04/04/18 2147     Discharge Medications: Please see discharge summary for a list of discharge medications.  Relevant Imaging Results:  Relevant  Lab Results:   Additional Information SS# [id number]  Mary Charon, LCSW

## 2018-04-05 NOTE — Evaluation (Signed)
Physical Therapy Evaluation Patient Details Name: Mary Nguyen MRN: [id number] DOB: 1951-03-13 Today's Date: 04/05/2018   History of Present Illness  Mary Nguyen is a 67 y.o. female with medical history significant of CVA with residual right-sided deficit, blindness, PVD, CAD, and heart murmur; who presents with complaints of left-sided chest pain that felt like pressure with radiation to her left arm and back while at rest.  Clinical Impression  Patient presents with decreased mobility due to weakness, deconditioning from being in bed at home most of the time and now reports her caregiver unable to assist as much due to pregnancy.  Currently mod to max A for mobility and feel she will benefit from STSNF level rehab prior to d/c home.     Follow Up Recommendations SNF;Supervision/Assistance - 24 hour    Equipment Recommendations  Rolling walker with 5" wheels;3in1 (PT);Other (comment);Hospital bed(shower seat)    Recommendations for Other Services       Precautions / Restrictions Precautions Precautions: Fall Precaution Comments: legally blind      Mobility  Bed Mobility Overal bed mobility: Needs Assistance Bed Mobility: Supine to Sit;Sit to Supine     Supine to sit: Mod assist Sit to supine: Mod assist   General bed mobility comments: assist for lifting trunk, guiding legs to EOB; to supine for legs in bed and positioning  Transfers Overall transfer level: Needs assistance   Transfers: Sit to/from Stand Sit to Stand: Max assist         General transfer comment: lifting and lowering assist due to posterior bias and blindness  Ambulation/Gait Ambulation/Gait assistance: Mod assist Ambulation Distance (Feet): 20 Feet(x 2) Assistive device: 1 person hand held assist;2 person hand held assist Gait Pattern/deviations: Step-to pattern;Step-through pattern;Decreased stride length;Leaning posteriorly;Wide base of support     General Gait Details: heavy posterior  lean, initially with single UE support on L and holding gait belt, then per pt request with bilat UE support and guidance for forward weight shift  Stairs            Wheelchair Mobility    Modified Rankin (Stroke Patients Only)       Balance Overall balance assessment: Needs assistance   Sitting balance-Leahy Scale: Fair     Standing balance support: Bilateral upper extremity supported Standing balance-Leahy Scale: Poor Standing balance comment: leaning back throughout                             Pertinent Vitals/Pain Pain Assessment: Faces Faces Pain Scale: Hurts even more Pain Location: all over Pain Descriptors / Indicators: Aching Pain Intervention(s): Repositioned;Monitored during session    Home Living Family/patient expects to be discharged to:: Other (Comment)(staying in a hotel) Living Arrangements: Children Available Help at Discharge: Family;Available 24 hours/day Type of Home: Other(Comment)(hotel) Home Access: Level entry     Home Layout: One level Home Equipment: Wheelchair - manual Additional Comments: states has a caregiver who is there when her son is at work, Banker reports this is her son's significant other,) pt reports this person is pregnant and having more difficulty helping her    Prior Function Level of Independence: Needs assistance   Gait / Transfers Assistance Needed: stays in bed except when walking to bathroom  ADL's / Homemaking Assistance Needed: assist for sponge bath  Comments: complains that she stays in bed all the time, reports was in rehab in [place] two months ago.     Hand Dominance  Extremity/Trunk Assessment   Upper Extremity Assessment Upper Extremity Assessment: RUE deficits/detail RUE Deficits / Details: hemipareisis with increased tone in hand/grip, but functional    Lower Extremity Assessment Lower Extremity Assessment: Generalized weakness;RLE deficits/detail;LLE deficits/detail RLE  Sensation: history of peripheral neuropathy RLE Coordination: decreased gross motor LLE Sensation: history of peripheral neuropathy       Communication   Communication: No difficulties  Cognition Arousal/Alertness: Awake/alert Behavior During Therapy: WFL for tasks assessed/performed Overall Cognitive Status: Within Functional Limits for tasks assessed                                        General Comments General comments (skin integrity, edema, etc.): Educated pt on balance limitations due to sensory changes and stroke.  Need for increased time on her feet to improve.    Exercises Other Exercises Other Exercises: sit <> stand x 5 with L UE on chair in front and cues for anterior weight shift with S to minguard   Assessment/Plan    PT Assessment Patient needs continued PT services  PT Problem List Decreased strength;Decreased mobility;Decreased safety awareness;Decreased activity tolerance;Decreased balance;Decreased knowledge of use of DME;Decreased coordination;Decreased knowledge of precautions       PT Treatment Interventions DME instruction;Therapeutic activities;Gait training;Therapeutic exercise;Patient/family education;Balance training;Functional mobility training    PT Goals (Current goals can be found in the Care Plan section)  Acute Rehab PT Goals Patient Stated Goal: Agrees to rehab PT Goal Formulation: With patient Time For Goal Achievement: 04/19/18 Potential to Achieve Goals: Fair    Frequency Min 2X/week   Barriers to discharge        Co-evaluation               AM-PAC PT "6 Clicks" Daily Activity  Outcome Measure Difficulty turning over in bed (including adjusting bedclothes, sheets and blankets)?: Unable Difficulty moving from lying on back to sitting on the side of the bed? : Unable Difficulty sitting down on and standing up from a chair with arms (e.g., wheelchair, bedside commode, etc,.)?: Unable Help needed moving to and  from a bed to chair (including a wheelchair)?: A Lot Help needed walking in hospital room?: A Lot Help needed climbing 3-5 steps with a railing? : Total 6 Click Score: 8    End of Session Equipment Utilized During Treatment: Gait belt Activity Tolerance: Patient tolerated treatment well Patient left: in bed;with call bell/phone within reach;with bed alarm set   PT Visit Diagnosis: Other abnormalities of gait and mobility (R26.89);History of falling (Z91.81);Other symptoms and signs involving the nervous system (R29.898)    Time: 1110-1140 PT Time Calculation (min) (ACUTE ONLY): 30 min   Charges:   PT Evaluation $PT Eval Moderate Complexity: 1 Mod PT Treatments $Gait Training: 8-22 mins   PT G CodesSheran Lawless, [place] [phone] 04/05/2018   Elray Mcgregor 04/05/2018, 3:56 PM

## 2018-04-05 NOTE — Care Management Note (Signed)
Case Management Note Mary Pierini RN, BSN Unit 4E-Case Manager [phone]  Patient Details  Name: Mary Nguyen MRN: [id number] Date of Birth: [birth year]  Subjective/Objective:    Pt admitted with [place]- unable to do cardiac cath due to access                Action/Plan: PTA pt was living with son and his SO in a motel- per conversation with pt she is mainly w/c bound- does not have a walker or cane- pt reports that she does have part D with medicare for drug coverage sometimes does not have money for medications. Pt used to live near [facility]) and has been in a SNF there, had a PCP there also- Dr. Mayford Nguyen - however does not have a provider here. Per PT eval recommendation for  SNF will have CSW consult for possible placement  Expected Discharge Date:                  Expected Discharge Plan:  Skilled Nursing Facility  In-House Referral:  Clinical Social Work  Discharge planning Services  CM Consult  Post Acute Care Choice:    Choice offered to:     DME Arranged:    DME Agency:     HH Arranged:    HH Agency:     Status of Service:  In process, will continue to follow  If discussed at Long Length of Stay Meetings, dates discussed:    Discharge Disposition:   Additional Comments:  Mary Span, RN 04/05/2018, 4:34 PM

## 2018-04-06 DIAGNOSIS — R072 Precordial pain: Secondary | ICD-10-CM

## 2018-04-06 DIAGNOSIS — I208 Other forms of angina pectoris: Secondary | ICD-10-CM

## 2018-04-06 MED ORDER — ASPIRIN 81 MG PO TBEC: 81.0000 mg | DELAYED_RELEASE_TABLET | Freq: Every day | ORAL | 0 refills | Status: DC

## 2018-04-06 MED ORDER — BATH/SHOWER SEAT MISC: 0 refills | Status: DC

## 2018-04-06 MED ORDER — OXYBUTYNIN CHLORIDE ER 5 MG PO TB24: 5.0000 mg | ORAL_TABLET | Freq: Every day | ORAL | 0 refills | Status: DC

## 2018-04-06 MED ORDER — ISOSORBIDE MONONITRATE ER 30 MG PO TB24: 30.0000 mg | ORAL_TABLET | Freq: Every day | ORAL | 0 refills | Status: DC

## 2018-04-06 MED ORDER — NITROGLYCERIN 0.4 MG SL SUBL: 0.4000 mg | SUBLINGUAL_TABLET | SUBLINGUAL | 0 refills | Status: DC | PRN

## 2018-04-06 MED ORDER — ATORVASTATIN CALCIUM 80 MG PO TABS: 80.0000 mg | ORAL_TABLET | Freq: Every day | ORAL | 0 refills | Status: AC

## 2018-04-06 MED ORDER — ATORVASTATIN CALCIUM 80 MG PO TABS: 80.0000 mg | ORAL_TABLET | Freq: Every day | ORAL | 0 refills | Status: DC

## 2018-04-06 MED ORDER — MAGNESIUM HYDROXIDE 400 MG/5ML PO SUSP: 30.0000 mL | Freq: Every day | ORAL | Status: DC | PRN

## 2018-04-06 MED ORDER — GABAPENTIN 300 MG PO CAPS: 300.0000 mg | ORAL_CAPSULE | Freq: Three times a day (TID) | ORAL | 0 refills | Status: DC

## 2018-04-06 NOTE — Progress Notes (Signed)
Clinical Social Work was consulted to assist patient and family with a cab voucher back to hotel. CSW wrote a cab voucher to [address] ([facility] for patient to discharge home with family. Cab voucher was given to RN to call cab company once family is ready to leave.   Marrianne Mood, MSW,  [employer] [phone]

## 2018-04-06 NOTE — Progress Notes (Signed)
Patient discharge order received. Patient medications reviewed with all printed prescriptions reviewed and given to son in room. Patient encouraged to pick up medications at pharmacy of choice. Patient health information upon discharge along with follow up appointments and after discharge care. Home health referral given with instructions and number to call once settled at home. Taxi voucher given and called for transport to home. VS stable. No acute distress. Patient on route to [place] tower entrance for [place] pick up.

## 2018-04-06 NOTE — Discharge Summary (Signed)
Discharge Summary  Mary Nguyen ZOX:[id number] DOB: 10-18-1951  PCP: Patient, No Pcp Per  Admit date: 04/01/2018 Discharge date: 04/06/2018   Time spent: < 25 minutes  Admitted From: Home Disposition:  Home  Recommendations for Outpatient Follow-up:  1. Follow up with PCP/Cardiology in 1-2 weeks 2. Start aspirin, atorvastatin, and imdur 30 mg daily.   Home Health:YES Equipment/Devices:wheelchair, bedside commode  Discharge Diagnoses:  Active Hospital Problems   Diagnosis Date Noted  . Unstable angina (HCC) 04/01/2018  . Angina at rest [facility]) 04/04/2018  . Precordial pain   . History of stroke 04/02/2018  . Hyperlipidemia 04/02/2018    Resolved Hospital Problems  No resolved problems to display.    Discharge Condition: Stable CODE STATUS:FULL Diet recommendation: Heart Healthy   Vitals:   04/06/18 1200 04/06/18 1212  BP: 119/69 104/70  Pulse:  76  Resp: 15 20  Temp:  98.3 F (36.8 C)  SpO2:      History of present illness:  Mary Nguyen is a 67 y.o. year old female with medical history significant for CVA with right-sided residual deficit, CAD, blindness, PAD who presented on 04/01/2018 with left-sided chest pain at rest with associated nausea, vomiting and mild shortness of breath and was found to have sinus bradycardia with concern for possible ACS however unable to evaluate with heart cath during admission. Remaining hospital course addressed in problem based format below:   Hospital Course:  Principal Problem:   Unstable angina [facility] Active Problems:   History of stroke   Hyperlipidemia   Precordial pain   Angina at rest [facility])   #Chest pain rule out.  Troponin trend x4-.  EKG with multiple PVCs, normal sinus rhythm with no acute ischemic changes.  D-dimer elevated at 0.9.  Chest x-ray and CTA chest no acute PE or other acute thoracic abnormalities.  Cardiology evaluated during admission.  Patient found to be bradycardic to 30s during one episode of  chest pain in the hospital.  Patient was previously evaluated with heart cath at outside hospital prior to this admission  Risk factors including poorly controlled dyslipidemia, bradycardia.  Medically managed with aspirin and atorvastatin.  No beta-blockers in setting of transient sinus bradycardia.  Heparin was not initiated given troponin remained negative. TTE no WMA, normal EF Unfortunately, was unable to obtain vascular access so no heart catheter during this admission; per cardiology repeat LHC should not be attempted in the future.   Patient was medically managed with Imdur low-dose 30 mg for presumed angina.  With close follow-up arranged with cardiology on discharge.  Patient was chest pain-free on discharge day  #Bradycardia. Improved with supportive care. Frequency PVCs also noted on EKG but no Beta blockers given concern for worsening bradycardiaa  # UTI. Dysuria complaints. Urine cx with multiple organisms (suggested recollection) Completed levaquin therapy.   #Dispo: PT recommended SNF. Patient and Son declined. Elected for home health. Patient and son live in hotel room.     Antibiotics: Levaquin: 4/27-5/1  Microbiology: Urine( 4/26): many species present ( suggest recollectoin)  Consultations:  Cardiology   Procedures/Studies: TTE: 4/27: preserved EF ( 60-65%), grade 1 diastolic dysfunction. Wall motion normal. Mild TR Dg Chest 2 View  Result Date: 04/01/2018 CLINICAL DATA:  Chest pain radiating to the left arm. EXAM: CHEST - 2 VIEW COMPARISON:  03/23/2018 FINDINGS: Hyperinflated lungs without pulmonary consolidation or CHF. Heart and mediastinal contours are stable and within normal limits. There is aortic atherosclerosis without aneurysmal dilatation. No effusion or pneumothorax. Redemonstration of upper  and lower thoracic compression fractures without significant change. IMPRESSION: Hyperinflated lungs without active pulmonary disease. Aortic atherosclerosis. Likely remote  upper and lower thoracic compression fractures. Electronically Signed   By: Tollie Eth M.D.   On: 04/01/2018 18:31   Dg Chest 2 View  Result Date: 03/23/2018 CLINICAL DATA:  Left-sided chest pain and shortness of breath. EXAM: CHEST - 2 VIEW COMPARISON:  None. FINDINGS: The heart size and pulmonary vascularity are normal and the lungs are clear. No effusions. Osteopenia. Moderate compression deformity of 1 of the lower thoracic vertebra, age indeterminate. IMPRESSION: 1. Compression deformity of 1 of the lower thoracic vertebra, age indeterminate. 2. Osteopenia. 3. No other significant abnormalities. Electronically Signed   By: Francene Boyers M.D.   On: 03/23/2018 07:50   Ct Head Wo Contrast  Result Date: 03/23/2018 CLINICAL DATA:  Weakness EXAM: CT HEAD WITHOUT CONTRAST TECHNIQUE: Contiguous axial images were obtained from the base of the skull through the vertex without intravenous contrast. COMPARISON:  None. FINDINGS: Brain: Areas of prior ischemia are noted in the parietal lobes bilaterally near the vertex. Mild atrophic changes are noted. No findings to suggest acute hemorrhage, acute infarction or space-occupying mass lesion are noted. Vascular: No hyperdense vessel or unexpected calcification. Skull: Normal. Negative for fracture or focal lesion. Sinuses/Orbits: No acute finding. Other: None. IMPRESSION: Chronic atrophic and ischemic changes without acute abnormality. Electronically Signed   By: Alcide Clever M.D.   On: 03/23/2018 07:55   Ct Angio Chest Pe W And/or Wo Contrast  Result Date: 04/01/2018 CLINICAL DATA:  Chest pain radiating to the left arm and back. Positive D-dimer. EXAM: CT ANGIOGRAPHY CHEST WITH CONTRAST TECHNIQUE: Multidetector CT imaging of the chest was performed using the standard protocol during bolus administration of intravenous contrast. Multiplanar CT image reconstructions and MIPs were obtained to evaluate the vascular anatomy. CONTRAST:  ISOVUE-370 IOPAMIDOL  (ISOVUE-370) INJECTION 76% COMPARISON:  None. FINDINGS: Cardiovascular: Contrast injection is sufficient to demonstrate satisfactory opacification of the pulmonary arteries to the segmental level. There is no pulmonary embolus. The main pulmonary artery is within normal limits for size. There is moderateaortic atherosclerosis. Heart size is normal, without pericardial effusion. Mediastinum/Nodes: No mediastinal, hilar or axillary lymphadenopathy. The visualized thyroid and thoracic esophageal course are unremarkable. Lungs/Pleura: No pulmonary nodules or masses. No pleural effusion or pneumothorax. No focal airspace consolidation. No focal pleural abnormality. Upper Abdomen: Contrast bolus timing is not optimized for evaluation of the abdominal organs. Within this limitation, the visualized organs of the upper abdomen are normal. Musculoskeletal: T3 and T7 compression deformities are likely chronic. Review of the MIP images confirms the above findings. IMPRESSION: 1. No pulmonary embolus or other acute thoracic abnormality. 2. Compression deformities at T3 and T7 are likely chronic. 3.  Aortic Atherosclerosis (ICD10-I70.0). Electronically Signed   By: Deatra Robinson M.D.   On: 04/01/2018 21:50   Mr Brain Wo Contrast  Result Date: 03/23/2018 CLINICAL DATA:  67 y/o F; left upper extremity and left lower extremity weakness. Left facial droop and slurred speech. EXAM: MRI HEAD WITHOUT CONTRAST TECHNIQUE: Multiplanar, multiecho pulse sequences of the brain and surrounding structures were obtained without intravenous contrast. COMPARISON:  03/23/2018 CT head FINDINGS: Brain: No acute infarction, hemorrhage, hydrocephalus, extra-axial collection or mass lesion. Chronic infarctions are present in the bilateral frontal lobes and right parietal lobe. There is faint siderosis associated with the left frontal cortical infarction. There are microvascular ischemic changes of white matter and parenchymal volume loss of the  brain. Vascular: Normal  flow voids. Skull and upper cervical spine: Normal marrow signal. Sinuses/Orbits: Opacification of the right mastoid tips. Left intra-ocular lens replacement. Otherwise negative. Other: None. IMPRESSION: 1. No acute intracranial abnormality identified. 2. Bifrontal and right parietal chronic infarctions. Electronically Signed   By: Mitzi Hansen M.D.   On: 03/23/2018 17:18     Discharge Exam: BP 104/70 (BP Location: Left Arm)   Pulse 76   Temp 98.3 F (36.8 C) (Oral)   Resp 20   Ht 5\' 2"  (1.575 m) Comment: estimated  Wt 43.3 kg (95 lb 7.4 oz) Comment: pillow on bed ONLY  SpO2 96%   BMI 17.46 kg/m   General: Lying in bed, no apparent distress Eyes: blind ENT: Oral Mucosa clear and moist Cardiovascular: slow rate and regular rhythm, no edema, no JVD Respiratory: Normal respiratory effort on room air, lungs clear to auscultation bilaterally Abdomen: soft, non-distended, non-tender, normal bowel sounds Skin: No Rash Neurologic: Grossly no focal neuro deficit.Mental status AAOx3, speech normal, Psychiatric:Appropriate affect, and mood   Discharge Instructions You were cared for by a hospitalist during your hospital stay. If you have any questions about your discharge medications or the care you received while you were in the hospital after you are discharged, you can call the unit and asked to speak with the hospitalist on call if the hospitalist that took care of you is not available. Once you are discharged, your primary care physician will handle any further medical issues. Please note that NO REFILLS for any discharge medications will be authorized once you are discharged, as it is imperative that you return to your primary care physician (or establish a relationship with a primary care physician if you do not have one) for your aftercare needs so that they can reassess your need for medications and monitor your lab values.  Discharge Instructions     DME Hospital bed   Complete by:  As directed    Bed type:  Semi-electric   Diet - low sodium heart healthy   Complete by:  As directed    Face-to-face encounter (required for Medicare/Medicaid patients)   Complete by:  As directed    I Laverna Peace certify that this patient is under my care and that I, or a nurse practitioner or physician's assistant working with me, had a face-to-face encounter that meets the physician face-to-face encounter requirements with this patient on 04/06/2018. The encounter with the patient was in whole, or in part for the following medical condition(s) which is the primary reason for home health care (List medical condition): CVA, CAD, PVD   The encounter with the patient was in whole, or in part, for the following medical condition, which is the primary reason for home health care:  CVA, CAD, PVD   I certify that, based on my findings, the following services are medically necessary home health services:   Nursing Physical therapy     Reason for Medically Necessary Home Health Services:  Therapy- Instruction on Safe use of Assistive Devices for ADLs   My clinical findings support the need for the above services:  Pain interferes with ambulation/mobility   Further, I certify that my clinical findings support that this patient is homebound due to:   Shortness of Breath with activity Ambulates short distances less than 300 feet     For home use only DME 4 wheeled rolling walker with seat   Complete by:  As directed    Patient needs a walker to treat with the  following condition:  Weakness   Home Health   Complete by:  As directed    To provide the following care/treatments:   PT RN     Increase activity slowly   Complete by:  As directed      Allergies as of 04/06/2018      Reactions   Penicillins Shortness Of Breath, Swelling      Medication List    TAKE these medications   aspirin 81 MG EC tablet Take 1 tablet (81 mg total) by mouth daily.     atorvastatin 80 MG tablet Commonly known as:  LIPITOR Take 1 tablet (80 mg total) by mouth daily at 6 PM.   Bath/Shower Seat Misc Shower seat Notes to patient:  May get at [facility] as Medicare does not cover. They are $15-$40 depending on which one you purchase.    gabapentin 300 MG capsule Commonly known as:  NEURONTIN Take 1 capsule (300 mg total) by mouth 3 (three) times daily for 7 days.   isosorbide mononitrate 30 MG 24 hr tablet Commonly known as:  IMDUR Take 1 tablet (30 mg total) by mouth daily.   nitroGLYCERIN 0.4 MG SL tablet Commonly known as:  NITROSTAT Place 1 tablet (0.4 mg total) under the tongue every 5 (five) minutes as needed for chest pain.   omeprazole 20 MG capsule Commonly known as:  PRILOSEC Take 1 capsule (20 mg total) by mouth 2 (two) times daily before a meal for 7 days.   oxybutynin 5 MG 24 hr tablet Commonly known as:  DITROPAN-XL Take 1 tablet (5 mg total) by mouth at bedtime.            Durable Medical Equipment  (From admission, onward)        Start     Ordered   04/06/18 0000  For home use only DME 4 wheeled rolling walker with seat    Question:  Patient needs a walker to treat with the following condition  Answer:  Weakness   04/06/18 1335   04/06/18 0000  DME Hospital bed    Question:  Bed type  Answer:  Semi-electric   04/06/18 1335     Allergies  Allergen Reactions  . Penicillins Shortness Of Breath and Swelling   Follow-up Information    Chrystie Nose, MD Follow up on 04/12/2018.   Specialty:  Cardiology Why:  Please arrive 15 minutes early for your 9:45am appointment Contact information: [address] [place] [address] [place] [address] [phone]        Jobe Gibbon, [facility] Follow up.   Specialty:  Home Health Services Why:  HHRN/PT arranged- they will call to set up visits Contact information: [address] [place] [address] [phone]            The results of  significant diagnostics from this hospitalization (including imaging, microbiology, ancillary and laboratory) are listed below for reference.    Significant Diagnostic Studies: Dg Chest 2 View  Result Date: 04/01/2018 CLINICAL DATA:  Chest pain radiating to the left arm. EXAM: CHEST - 2 VIEW COMPARISON:  03/23/2018 FINDINGS: Hyperinflated lungs without pulmonary consolidation or CHF. Heart and mediastinal contours are stable and within normal limits. There is aortic atherosclerosis without aneurysmal dilatation. No effusion or pneumothorax. Redemonstration of upper and lower thoracic compression fractures without significant change. IMPRESSION: Hyperinflated lungs without active pulmonary disease. Aortic atherosclerosis. Likely remote upper and lower thoracic compression fractures. Electronically Signed   By: Onalee Hua  Sterling Big M.D.   On: 04/01/2018 18:31   Dg Chest 2 View  Result Date: 03/23/2018 CLINICAL DATA:  Left-sided chest pain and shortness of breath. EXAM: CHEST - 2 VIEW COMPARISON:  None. FINDINGS: The heart size and pulmonary vascularity are normal and the lungs are clear. No effusions. Osteopenia. Moderate compression deformity of 1 of the lower thoracic vertebra, age indeterminate. IMPRESSION: 1. Compression deformity of 1 of the lower thoracic vertebra, age indeterminate. 2. Osteopenia. 3. No other significant abnormalities. Electronically Signed   By: Francene Boyers M.D.   On: 03/23/2018 07:50   Ct Head Wo Contrast  Result Date: 03/23/2018 CLINICAL DATA:  Weakness EXAM: CT HEAD WITHOUT CONTRAST TECHNIQUE: Contiguous axial images were obtained from the base of the skull through the vertex without intravenous contrast. COMPARISON:  None. FINDINGS: Brain: Areas of prior ischemia are noted in the parietal lobes bilaterally near the vertex. Mild atrophic changes are noted. No findings to suggest acute hemorrhage, acute infarction or space-occupying mass lesion are noted. Vascular: No hyperdense  vessel or unexpected calcification. Skull: Normal. Negative for fracture or focal lesion. Sinuses/Orbits: No acute finding. Other: None. IMPRESSION: Chronic atrophic and ischemic changes without acute abnormality. Electronically Signed   By: Alcide Clever M.D.   On: 03/23/2018 07:55   Ct Angio Chest Pe W And/or Wo Contrast  Result Date: 04/01/2018 CLINICAL DATA:  Chest pain radiating to the left arm and back. Positive D-dimer. EXAM: CT ANGIOGRAPHY CHEST WITH CONTRAST TECHNIQUE: Multidetector CT imaging of the chest was performed using the standard protocol during bolus administration of intravenous contrast. Multiplanar CT image reconstructions and MIPs were obtained to evaluate the vascular anatomy. CONTRAST:  ISOVUE-370 IOPAMIDOL (ISOVUE-370) INJECTION 76% COMPARISON:  None. FINDINGS: Cardiovascular: Contrast injection is sufficient to demonstrate satisfactory opacification of the pulmonary arteries to the segmental level. There is no pulmonary embolus. The main pulmonary artery is within normal limits for size. There is moderateaortic atherosclerosis. Heart size is normal, without pericardial effusion. Mediastinum/Nodes: No mediastinal, hilar or axillary lymphadenopathy. The visualized thyroid and thoracic esophageal course are unremarkable. Lungs/Pleura: No pulmonary nodules or masses. No pleural effusion or pneumothorax. No focal airspace consolidation. No focal pleural abnormality. Upper Abdomen: Contrast bolus timing is not optimized for evaluation of the abdominal organs. Within this limitation, the visualized organs of the upper abdomen are normal. Musculoskeletal: T3 and T7 compression deformities are likely chronic. Review of the MIP images confirms the above findings. IMPRESSION: 1. No pulmonary embolus or other acute thoracic abnormality. 2. Compression deformities at T3 and T7 are likely chronic. 3.  Aortic Atherosclerosis (ICD10-I70.0). Electronically Signed   By: Deatra Robinson M.D.   On:  04/01/2018 21:50   Mr Brain Wo Contrast  Result Date: 03/23/2018 CLINICAL DATA:  67 y/o F; left upper extremity and left lower extremity weakness. Left facial droop and slurred speech. EXAM: MRI HEAD WITHOUT CONTRAST TECHNIQUE: Multiplanar, multiecho pulse sequences of the brain and surrounding structures were obtained without intravenous contrast. COMPARISON:  03/23/2018 CT head FINDINGS: Brain: No acute infarction, hemorrhage, hydrocephalus, extra-axial collection or mass lesion. Chronic infarctions are present in the bilateral frontal lobes and right parietal lobe. There is faint siderosis associated with the left frontal cortical infarction. There are microvascular ischemic changes of white matter and parenchymal volume loss of the brain. Vascular: Normal flow voids. Skull and upper cervical spine: Normal marrow signal. Sinuses/Orbits: Opacification of the right mastoid tips. Left intra-ocular lens replacement. Otherwise negative. Other: None. IMPRESSION: 1. No acute intracranial abnormality identified. 2.  Bifrontal and right parietal chronic infarctions. Electronically Signed   By: Mitzi Hansen M.D.   On: 03/23/2018 17:18    Microbiology: Recent Results (from the past 240 hour(s))  Urine culture     Status: Abnormal   Collection Time: 04/01/18  6:03 PM  Result Value Ref Range Status   Specimen Description URINE, RANDOM  Final   Special Requests   Final    NONE Performed at [facility], [address] [place], [place] [address]    Culture MULTIPLE SPECIES PRESENT, SUGGEST RECOLLECTION (A)  Final   Report Status 04/03/2018 FINAL  Final     Labs: Basic Metabolic Panel: Recent Labs  Lab 04/01/18 1748 04/02/18 0202 04/03/18 0300 04/04/18 1043  NA 142 139 140  --   K 3.6 3.7 3.8  --   CL 107 106 108  --   CO2 --   GLUCOSE 105* 102* 99  --   BUN --   CREATININE 0.80 0.72 0.67 0.72  CALCIUM 9.4 9.1 8.6*  --    Liver Function Tests: Recent  Labs  Lab 04/01/18 1748  AST 18  ALT 14  ALKPHOS 118  BILITOT 0.9  PROT 7.1  ALBUMIN 3.7   Recent Labs  Lab 04/01/18 1748  LIPASE 29   No results for input(s): AMMONIA in the last 168 hours. CBC: Recent Labs  Lab 04/01/18 1748 04/02/18 0202 04/04/18 1043  WBC 8.4 7.8 6.8  NEUTROABS  --  3.4  --   HGB 14.1 13.5 12.2  HCT 42.7 40.9 38.1  MCV 98.6 98.8 101.3*  PLT 244 238 205   Cardiac Enzymes: Recent Labs  Lab 04/02/18 0202 04/02/18 1108 04/02/18 1658 04/02/18 2239  TROPONINI <0.03 <0.03 <0.03 <0.03   BNP: BNP (last 3 results) No results for input(s): BNP in the last 8760 hours.  ProBNP (last 3 results) No results for input(s): PROBNP in the last 8760 hours.  CBG: No results for input(s): GLUCAP in the last 168 hours.     Signed:  Laverna Peace, MD [facility] 04/07/2018, 12:21 AM

## 2018-04-06 NOTE — Progress Notes (Signed)
IV site removed from Left AC with catheter tip intact. No bleeding noted post removal. Patient tolerated well. Care questions asked to patient concerning discharge transportation. Patient not aware of arrangements. Patient stated "ask son."

## 2018-04-06 NOTE — Progress Notes (Signed)
Clinical Social Worker following patient and family for support and discharge need. Patients son [name] stated that patient will not go back to a rehab facility but will discharge back home with him and his wife. Son stated they are staying at the [facility] on [address]. (room 247). Son stated he would want to restart Home Health at the hotel and [facility] aware of family's decisions. Son stated he will be able to transport patient back to hotel once cleared by MD. [place] signing off as patients social work needs have been met. Please consult me again if a new needs arise.     Rhea Pink, MSW,  [place]

## 2018-04-06 NOTE — Care Management Note (Signed)
Case Management Note [name] RN, BSN Unit 4E-Case Manager [phone]  Patient Details  Name: Mary Nguyen MRN: [id number] Date of Birth: [birth year]  Subjective/Objective:    Pt admitted with [place]- unable to do cardiac cath due to access                Action/Plan: PTA pt was living with son and his SO in a motel- per conversation with pt she is mainly w/c bound- does not have a walker or cane- pt reports that she does have part D with medicare for drug coverage sometimes does not have money for medications. Pt used to live near [facility]) and has been in a SNF there, had a PCP there also- Dr. Mayford Knife - however does not have a provider here. Per PT eval recommendation for  SNF will have CSW consult for possible placement  Expected Discharge Date:  04/06/18               Expected Discharge Plan:  Skilled Nursing Facility  In-House Referral:  Clinical Social Work  Discharge planning Services  CM Consult  Post Acute Care Choice:  Durable Medical Equipment, Home Health Choice offered to:  Patient, Adult Children  DME Arranged:  Walker rolling with seat, Hospital bed, Tub bench DME Agency:     HH Arranged:  RN, PT HH Agency:  [facility]  Status of Service:  Completed, signed off  If discussed at Long Length of Stay Meetings, dates discussed:    Discharge Disposition: home/self care   Additional Comments:  04/06/18- 1420- Mary Pretlow RN, CM- pt and son have declined SNF recommendation and have chosen to return to [employer] with [facility] services - per son pt has been active with [facility] in the past and would like to use them again- [facility] orders placed for HHRN/PT- orders also placed for DME- hospital bed, shower chair, and rollator- however- Medicare does not cover shower chairs- and in speaking with Lupita Leash at [facility]- they will not deliver hospital bed to motel address, pt has w/c (at bedside)- and medicare will not cover rollator within 5 yr period.  Referral for [facility] called to [facility] with [facility]- referral accepted- pt's address is  Extended Choice Stay  [address], [place] [address] Room 247  Alternate Phone contact- [place]- [phone]  [name], RN 04/06/2018, 2:25 PM

## 2018-04-12 ENCOUNTER — Ambulatory Visit: Payer: Medicare Other | Admitting: Internal Medicine

## 2018-04-13 ENCOUNTER — Encounter: Payer: Self-pay | Admitting: *Deleted

## 2018-04-22 ENCOUNTER — Other Ambulatory Visit: Payer: Self-pay

## 2018-04-22 ENCOUNTER — Encounter (HOSPITAL_COMMUNITY): Payer: Self-pay | Admitting: Emergency Medicine

## 2018-04-22 ENCOUNTER — Emergency Department (HOSPITAL_COMMUNITY)
Admission: EM | Admit: 2018-04-22 | Discharge: 2018-04-22 | Disposition: A | Discharge status: Home / Self Care | Payer: Medicare Other | Attending: Emergency Medicine | Admitting: Emergency Medicine

## 2018-04-22 ENCOUNTER — Emergency Department (HOSPITAL_COMMUNITY): Payer: Medicare Other

## 2018-04-22 DIAGNOSIS — Y999 Unspecified external cause status: Secondary | ICD-10-CM | POA: Insufficient documentation

## 2018-04-22 DIAGNOSIS — W1830XA Fall on same level, unspecified, initial encounter: Secondary | ICD-10-CM | POA: Insufficient documentation

## 2018-04-22 DIAGNOSIS — S32010A Wedge compression fracture of first lumbar vertebra, initial encounter for closed fracture: Secondary | ICD-10-CM | POA: Diagnosis not present

## 2018-04-22 DIAGNOSIS — M542 Cervicalgia: Secondary | ICD-10-CM | POA: Insufficient documentation

## 2018-04-22 DIAGNOSIS — M25511 Pain in right shoulder: Secondary | ICD-10-CM | POA: Diagnosis not present

## 2018-04-22 DIAGNOSIS — Z79899 Other long term (current) drug therapy: Secondary | ICD-10-CM | POA: Diagnosis not present

## 2018-04-22 DIAGNOSIS — Z7982 Long term (current) use of aspirin: Secondary | ICD-10-CM | POA: Insufficient documentation

## 2018-04-22 DIAGNOSIS — Y9259 Other trade areas as the place of occurrence of the external cause: Secondary | ICD-10-CM | POA: Insufficient documentation

## 2018-04-22 DIAGNOSIS — Z043 Encounter for examination and observation following other accident: Secondary | ICD-10-CM | POA: Diagnosis present

## 2018-04-22 DIAGNOSIS — M25512 Pain in left shoulder: Secondary | ICD-10-CM | POA: Insufficient documentation

## 2018-04-22 DIAGNOSIS — R51 Headache: Secondary | ICD-10-CM | POA: Diagnosis not present

## 2018-04-22 DIAGNOSIS — I2510 Atherosclerotic heart disease of native coronary artery without angina pectoris: Secondary | ICD-10-CM | POA: Insufficient documentation

## 2018-04-22 DIAGNOSIS — R0789 Other chest pain: Secondary | ICD-10-CM | POA: Diagnosis not present

## 2018-04-22 DIAGNOSIS — W19XXXA Unspecified fall, initial encounter: Secondary | ICD-10-CM

## 2018-04-22 DIAGNOSIS — F17210 Nicotine dependence, cigarettes, uncomplicated: Secondary | ICD-10-CM | POA: Insufficient documentation

## 2018-04-22 DIAGNOSIS — Y939 Activity, unspecified: Secondary | ICD-10-CM | POA: Insufficient documentation

## 2018-04-22 MED ORDER — METHOCARBAMOL 500 MG PO TABS: 500.0000 mg | ORAL_TABLET | Freq: Every evening | ORAL | 0 refills | Status: DC | PRN

## 2018-04-22 MED ORDER — OXYCODONE-ACETAMINOPHEN 5-325 MG PO TABS
1.0000 | ORAL_TABLET | Freq: Once | ORAL | Status: AC
  Administered 2018-04-22: 1 via ORAL
  Filled 2018-04-22: qty 1

## 2018-04-22 MED ORDER — OXYCODONE-ACETAMINOPHEN 5-325 MG PO TABS: 1.0000 | ORAL_TABLET | ORAL | 0 refills | Status: DC | PRN

## 2018-04-22 NOTE — ED Notes (Signed)
Mary Nguyen phone number [phone]

## 2018-04-22 NOTE — Discharge Instructions (Signed)
Please wear the back brace for support until you can follow up with your doctor Take Percocet every 4-6 hours for severe pain Take Robaxin as needed for muscle pain/spasms

## 2018-04-22 NOTE — ED Notes (Signed)
[employer] placed lumbar corset

## 2018-04-22 NOTE — ED Provider Notes (Signed)
[facility]-EMERGENCY DEPT Provider Note   CSN: [id number] Arrival date & time: 04/22/18  0343     History   Chief Complaint Chief Complaint  Patient presents with  . Back Pain  . Flank Pain  . Fall    HPI Mary Nguyen is a 67 y.o. female who presents with pain after a mechanical fall. PMH significant for blindness, CAD, hx of CVA with residual right sided weakness. She states that she slipped and had a fall this morning. She fell backwards on to her back at about 2 in the morning. Since then she has bilateral shoulder pain R>L, neck pain, low back pain, bilateral rib pain (L>R), pelvis pain (L>R). She took  Tylenol PTA. She has been able to ambulate with assistance. She is unsure if she hit her head. She has a frontal headache and states she had the "wind knocked out" of her but doesn't feel SOB at the moment. She states she is currently living at a motel with her son. She is originally from [place].  HPI  Past Medical History:  Diagnosis Date  . Blindness   . Bradycardia    a. pt reports hx of slow HR.  Marland Kitchen CAD in native artery    a. pt reports "blockage in the back of her heart" sometime in 2018 by cath at [facility].  . Carotid artery disease (HCC)    a. pt reports "blockage scraped" R neck artery around 2000.  . Cataract   . Former tobacco use   . Heart murmur   . Hyperlipidemia   . PAD (peripheral artery disease) (HCC)    a. she was told she had poor circulation from the waist down.  . Skipped heart beats   . Stroke [facility])    a. 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    Patient Active Problem List   Diagnosis Date Noted  . Angina at rest [facility]) 04/04/2018  . Precordial pain   . History of stroke 04/02/2018  . Hyperlipidemia 04/02/2018  . Unstable angina (HCC) 04/01/2018    Past Surgical History:  Procedure Laterality Date  . LEFT HEART CATH AND CORONARY  ANGIOGRAPHY N/A 04/04/2018   Procedure: LEFT HEART CATH AND CORONARY ANGIOGRAPHY;  Surgeon: Lyn Records, MD;  Location: [facility] INVASIVE CV LAB;  Service: Cardiovascular;  Laterality: N/A;  . TUBAL LIGATION       OB History   None      Home Medications    Prior to Admission medications   Medication Sig Start Date End Date Taking? Authorizing Provider  acetaminophen (TYLENOL) 500 MG tablet Take 1,000 mg by mouth every 6 (six) hours as needed for moderate pain.   Yes [provider]  atorvastatin (LIPITOR) 80 MG tablet Take 1 tablet (80 mg total) by mouth daily at 6 PM. 04/06/18  Yes Roberto Scales D, MD  isosorbide mononitrate (IMDUR) 30 MG 24 hr tablet Take 1 tablet (30 mg total) by mouth daily. 04/07/18  Yes Roberto Scales D, MD  naproxen sodium (ALEVE) 220 MG tablet Take 440 mg by mouth 2 (two) times daily as needed (pain).   Yes [provider]  nicotine (NICODERM CQ - DOSED IN MG/24 HR) 7 mg/24hr patch Place 7 mg onto the skin daily.   Yes [provider]  aspirin 81 MG EC tablet Take 1 tablet (81 mg total) by mouth daily.  Patient not taking: Reported on 04/22/2018 04/07/18   Roberto Scales D, MD  gabapentin (NEURONTIN) 300 MG capsule Take 1 capsule (300 mg total) by mouth 3 (three) times daily for 7 days. Patient not taking: Reported on 04/01/2018 03/23/18 03/30/18  Cristina Gong, PA-C  gabapentin (NEURONTIN) 300 MG capsule Take 1 capsule (300 mg total) by mouth 3 (three) times daily for 7 days. 04/06/18 04/13/18  Laverna Peace, MD  Misc. Devices (BATH/SHOWER SEAT) MISC Shower seat 04/06/18   Roberto Scales D, MD  nitroGLYCERIN (NITROSTAT) 0.4 MG SL tablet Place 1 tablet (0.4 mg total) under the tongue every 5 (five) minutes as needed for chest pain. Patient not taking: Reported on 04/22/2018 04/06/18   Roberto Scales D, MD  omeprazole (PRILOSEC) 20 MG capsule Take 1 capsule (20 mg total) by mouth 2 (two) times daily before a meal for 7 days. Patient not taking:  Reported on 04/01/2018 03/23/18 03/30/18  Cristina Gong, PA-C  oxybutynin (DITROPAN-XL) 5 MG 24 hr tablet Take 1 tablet (5 mg total) by mouth at bedtime. Patient not taking: Reported on 04/22/2018 04/06/18 05/06/18  Laverna Peace, MD    Family History Family History  Problem Relation Age of Onset  . Hypertension Mother   . Heart disease Father        details unclear    Social History Social History   Tobacco Use  . Smoking status: Current Every Day Smoker    Packs/day: 0.50    Years: 10.00    Pack years: 5.00    Types: Cigarettes  . Smokeless tobacco: Never Used  . Tobacco comment: Pt reports she recently quit after 45 years but family says she still smokes  Substance Use Topics  . Alcohol use: Not Currently  . Drug use: Not Currently     Allergies   Penicillins   Review of Systems Review of Systems  Respiratory: Positive for shortness of breath (resolved).   Cardiovascular: Positive for chest pain (rib).  Gastrointestinal: Negative for abdominal pain.  Musculoskeletal: Positive for arthralgias (shoulder, hips), back pain, myalgias and neck pain. Negative for gait problem and joint swelling.  Neurological: Positive for headaches. Negative for syncope.  All other systems reviewed and are negative.    Physical Exam Updated Vital Signs BP 128/66 (BP Location: Left Arm)   Pulse 62   Temp 98.2 F (36.8 C) (Oral)   Resp 15   SpO2 98%   Physical Exam  Constitutional: She is oriented to person, place, and time. She appears well-developed and well-nourished. No distress.  Calm and cooperative. Legally blind.  HENT:  Head: Normocephalic and atraumatic.  Eyes: Pupils are equal, round, and reactive to light. Conjunctivae are normal. Right eye exhibits no discharge. Left eye exhibits no discharge. No scleral icterus.  Neck: Normal range of motion.  Midline tenderness  Cardiovascular: Normal rate and regular rhythm.  Pulmonary/Chest: Effort normal and breath sounds  normal. No respiratory distress. She exhibits tenderness.  Abdominal: Soft. Bowel sounds are normal. She exhibits no distension. There is no tenderness.  Musculoskeletal:  Low back tenderness. Bilateral hip tenderness.  Neurological: She is alert and oriented to person, place, and time.  Right sided weakness from prior stroke  Skin: Skin is warm and dry.  Psychiatric: She has a normal mood and affect. Her behavior is normal.  Nursing note and vitals reviewed.    ED Treatments / Results  Labs (all labs ordered are listed, but only abnormal results are displayed) Labs Reviewed -  No data to display  EKG None  Radiology Dg Ribs Unilateral W/chest Left  Result Date: 04/22/2018 CLINICAL DATA:  Left posterior flank and back pain. Right shoulder pain. Abdominal pain. Fall. EXAM: LEFT RIBS AND CHEST - 3+ VIEW COMPARISON:  March 23, 2018 and April 01, 2018 chest x-rays. Chest CT April 01, 2018. FINDINGS: Cardiomediastinal silhouette is stable. The lungs remain clear and unchanged. No pneumothorax.  No rib fractures noted. IMPRESSION: 1. Stable compression fracture of a lower thoracic vertebral body. No rib fractures noted. No other abnormalities seen in the chest. Electronically Signed   By: Gerome Sam III M.D   On: 04/22/2018 08:34   Dg Lumbar Spine Complete  Result Date: 04/22/2018 CLINICAL DATA:  Pain after fall EXAM: LUMBAR SPINE - COMPLETE 4+ VIEW COMPARISON:  Chest CT April 01, 2018 FINDINGS: A compression fracture of T11 is unchanged since April 2019. There is mild anterior wedging of T1 with scalloping of the superior endplate and 10% loss of anterior height, not seen on the comparison CT scan. No other fractures or malalignment. Mild degenerative changes. Atherosclerotic changes in the thoracic aorta. IMPRESSION: 1. There is a mild compression fracture of L1 with scalloping of the superior endplate and 10% loss of anterior height new since April 2019. Given the history of fall and the  short interval since the comparison study, this is suspicious for an acute compression fracture. 2. Stable compression fracture of T11 as above. 3. No other acute abnormalities. Electronically Signed   By: Gerome Sam III M.D   On: 04/22/2018 08:39   Dg Shoulder Right  Result Date: 04/22/2018 CLINICAL DATA:  Pain after fall EXAM: RIGHT SHOULDER - 2+ VIEW COMPARISON:  None. FINDINGS: There is no evidence of fracture or dislocation. There is no evidence of arthropathy or other focal bone abnormality. Soft tissues are unremarkable. IMPRESSION: Negative. Electronically Signed   By: Gerome Sam III M.D   On: 04/22/2018 08:35   Ct Head Wo Contrast  Result Date: 04/22/2018 CLINICAL DATA:  Pain following fall EXAM: CT HEAD WITHOUT CONTRAST CT CERVICAL SPINE WITHOUT CONTRAST TECHNIQUE: Multidetector CT imaging of the head and cervical spine was performed following the standard protocol without intravenous contrast. Multiplanar CT image reconstructions of the cervical spine were also generated. COMPARISON:  Head CT and brain MRI March 23, 2018 FINDINGS: CT HEAD FINDINGS Brain: There is stable mild diffuse atrophy. There is no intracranial mass, hemorrhage, extra-axial fluid collection, or midline shift. There is evidence of a prior infarct at the superior left frontal-parietal junction region. There is a smaller infarct in the anterior right parietal lobe. There is widespread small vessel disease throughout the centra semiovale bilaterally. No acute infarct evident. No new gray-white compartment lesions are appreciable. Vascular: There is no appreciable hyperdense vessel. There is calcification in each carotid siphon region as well as in each distal vertebral artery. Skull: Bony calvarium appears intact. Sinuses/Orbits: There is mild mucosal thickening in several ethmoid air cells bilaterally. Orbits appear symmetric bilaterally. Other: Mastoid air cells are clear. CT CERVICAL SPINE FINDINGS Alignment: There  is no appreciable spondylolisthesis. Skull base and vertebrae: Skull base and craniocervical junction regions appear normal. No fracture is demonstrable. There are no blastic or lytic bone lesions. Soft tissues and spinal canal: Prevertebral soft tissues and predental space regions are normal. No paraspinous lesion. No cord or canal hematoma is appreciable. Disc levels: There is moderate disc space narrowing at C5-6 and C6-7. There is facet hypertrophy at several levels bilaterally. No  disc extrusion or stenosis evident. Upper chest: Visualized upper lung zones are clear. Other: There is carotid artery calcification bilaterally. There is also distal vertebral artery calcification. IMPRESSION: CT head: Atrophy with extensive supratentorial small vessel disease. Prior infarcts superiorly, slightly larger on the left than on the right. No acute infarct evident. No mass or hemorrhage. There are foci of arterial vascular calcification. There is mucosal thickening in several ethmoid air cells. CT cervical spine: No demonstrable fracture or spondylolisthesis. Osteoarthritic changes several levels. No frank disc extrusion or stenosis evident. Carotid and vertebral artery calcification bilaterally noted. Electronically Signed   By: Bretta Bang III M.D.   On: 04/22/2018 08:23   Ct Cervical Spine Wo Contrast  Result Date: 04/22/2018 CLINICAL DATA:  Pain following fall EXAM: CT HEAD WITHOUT CONTRAST CT CERVICAL SPINE WITHOUT CONTRAST TECHNIQUE: Multidetector CT imaging of the head and cervical spine was performed following the standard protocol without intravenous contrast. Multiplanar CT image reconstructions of the cervical spine were also generated. COMPARISON:  Head CT and brain MRI March 23, 2018 FINDINGS: CT HEAD FINDINGS Brain: There is stable mild diffuse atrophy. There is no intracranial mass, hemorrhage, extra-axial fluid collection, or midline shift. There is evidence of a prior infarct at the superior left  frontal-parietal junction region. There is a smaller infarct in the anterior right parietal lobe. There is widespread small vessel disease throughout the centra semiovale bilaterally. No acute infarct evident. No new gray-white compartment lesions are appreciable. Vascular: There is no appreciable hyperdense vessel. There is calcification in each carotid siphon region as well as in each distal vertebral artery. Skull: Bony calvarium appears intact. Sinuses/Orbits: There is mild mucosal thickening in several ethmoid air cells bilaterally. Orbits appear symmetric bilaterally. Other: Mastoid air cells are clear. CT CERVICAL SPINE FINDINGS Alignment: There is no appreciable spondylolisthesis. Skull base and vertebrae: Skull base and craniocervical junction regions appear normal. No fracture is demonstrable. There are no blastic or lytic bone lesions. Soft tissues and spinal canal: Prevertebral soft tissues and predental space regions are normal. No paraspinous lesion. No cord or canal hematoma is appreciable. Disc levels: There is moderate disc space narrowing at C5-6 and C6-7. There is facet hypertrophy at several levels bilaterally. No disc extrusion or stenosis evident. Upper chest: Visualized upper lung zones are clear. Other: There is carotid artery calcification bilaterally. There is also distal vertebral artery calcification. IMPRESSION: CT head: Atrophy with extensive supratentorial small vessel disease. Prior infarcts superiorly, slightly larger on the left than on the right. No acute infarct evident. No mass or hemorrhage. There are foci of arterial vascular calcification. There is mucosal thickening in several ethmoid air cells. CT cervical spine: No demonstrable fracture or spondylolisthesis. Osteoarthritic changes several levels. No frank disc extrusion or stenosis evident. Carotid and vertebral artery calcification bilaterally noted. Electronically Signed   By: Bretta Bang III M.D.   On: 04/22/2018  08:23   Dg Hips Bilat W Or Wo Pelvis 3-4 Views  Result Date: 04/22/2018 CLINICAL DATA:  Bilateral hip pain due to a fall last night. Initial encounter. EXAM: DG HIP (WITH OR WITHOUT PELVIS) 3-4V BILAT COMPARISON:  None. FINDINGS: There is no evidence of hip fracture or dislocation. There is no evidence of arthropathy or other focal bone abnormality. Atherosclerotic vascular disease noted. IMPRESSION: No acute abnormality. Atherosclerosis. Electronically Signed   By: Drusilla Kanner M.D.   On: 04/22/2018 08:54    Procedures Procedures (including critical care time)  Medications Ordered in ED Medications  oxyCODONE-acetaminophen (PERCOCET/ROXICET) 5-325  MG per tablet 1 tablet (has no administration in time range)  oxyCODONE-acetaminophen (PERCOCET/ROXICET) 5-325 MG per tablet 1 tablet (1 tablet Oral Given 04/22/18 0647)     Initial Impression / Assessment and Plan / ED Course  I have reviewed the triage vital signs and the nursing notes.  Pertinent labs & imaging results that were available during my care of the patient were reviewed by me and considered in my medical decision making (see chart for details).  66 year old female presents with a mechanical fall and multiple areas of pain.  Her vital signs are normal.  Will obtain CT head, C-spine, and x-rays of the shoulder, low back, bilateral hips.  Lumbar x-rays remarkable for L1 compression fracture with less than 10% height loss.  All other imaging is unremarkable.  She was given pain control and a lumbar corset was placed.  Discussed the results with her son as well.  They were given referral to orthopedics and medication for pain at home.  Return precautions were given.  Final Clinical Impressions(s) / ED Diagnoses   Final diagnoses:  Closed compression fracture of body of L1 vertebra [facility])  Fall, initial encounter    ED Discharge Orders    None       Bethel Born, PA-C 04/22/18 1142    Zadie Rhine,  MD 04/22/18 [phone]

## 2018-04-22 NOTE — ED Notes (Signed)
[employer] called for lumbar corset

## 2018-04-22 NOTE — ED Triage Notes (Addendum)
Pt from home with c/o left flank and lower back pain, bilateral shoulder pain, and abdominal pain following a witnessed fall. Pt has hx of stroke with rt side deficits at baseline. Fall was witnessed by pt's daughters at extended care facility. Pt is legally blind in both eyes. Fall was 2 hours PTA

## 2018-06-04 ENCOUNTER — Emergency Department (HOSPITAL_COMMUNITY)
Admission: EM | Admit: 2018-06-04 | Discharge: 2018-06-05 | Disposition: A | Discharge status: Home / Self Care | Payer: Medicare Other | Attending: Emergency Medicine | Admitting: Emergency Medicine

## 2018-06-04 ENCOUNTER — Encounter (HOSPITAL_COMMUNITY): Payer: Self-pay

## 2018-06-04 ENCOUNTER — Emergency Department (HOSPITAL_COMMUNITY): Payer: Medicare Other

## 2018-06-04 ENCOUNTER — Other Ambulatory Visit: Payer: Self-pay

## 2018-06-04 DIAGNOSIS — N3000 Acute cystitis without hematuria: Secondary | ICD-10-CM | POA: Insufficient documentation

## 2018-06-04 DIAGNOSIS — E785 Hyperlipidemia, unspecified: Secondary | ICD-10-CM | POA: Diagnosis not present

## 2018-06-04 DIAGNOSIS — R059 Cough, unspecified: Secondary | ICD-10-CM

## 2018-06-04 DIAGNOSIS — Z8673 Personal history of transient ischemic attack (TIA), and cerebral infarction without residual deficits: Secondary | ICD-10-CM | POA: Insufficient documentation

## 2018-06-04 DIAGNOSIS — Z79899 Other long term (current) drug therapy: Secondary | ICD-10-CM | POA: Insufficient documentation

## 2018-06-04 DIAGNOSIS — I2510 Atherosclerotic heart disease of native coronary artery without angina pectoris: Secondary | ICD-10-CM | POA: Insufficient documentation

## 2018-06-04 DIAGNOSIS — R300 Dysuria: Secondary | ICD-10-CM | POA: Diagnosis present

## 2018-06-04 DIAGNOSIS — F17210 Nicotine dependence, cigarettes, uncomplicated: Secondary | ICD-10-CM | POA: Diagnosis not present

## 2018-06-04 DIAGNOSIS — R05 Cough: Secondary | ICD-10-CM | POA: Diagnosis not present

## 2018-06-04 LAB — URINALYSIS, ROUTINE W REFLEX MICROSCOPIC
Glucose, UA: NEGATIVE mg/dL
KETONES UR: 5 mg/dL — AB
Nitrite: NEGATIVE
PH: 5 (ref 5.0–8.0)
Protein, ur: 100 mg/dL — AB
Specific Gravity, Urine: 1.028 (ref 1.005–1.030)

## 2018-06-04 MED ORDER — BENZONATATE 100 MG PO CAPS: 100.0000 mg | ORAL_CAPSULE | Freq: Three times a day (TID) | ORAL | 0 refills | Status: DC

## 2018-06-04 MED ORDER — SULFAMETHOXAZOLE-TRIMETHOPRIM 800-160 MG PO TABS: 1.0000 | ORAL_TABLET | Freq: Two times a day (BID) | ORAL | 0 refills | Status: DC

## 2018-06-04 NOTE — ED Triage Notes (Signed)
Patient arrived via EMS for UTI symptoms. Patient is blind.

## 2018-06-04 NOTE — ED Notes (Signed)
Bed: [facility] Expected date:  Expected time:  Means of arrival:  Comments: No bed 

## 2018-06-04 NOTE — ED Notes (Signed)
Pt ambulated in room but needed wheel chair to go to BR and back to room. Tolerated well from standing from bed to wheel chair.

## 2018-06-04 NOTE — ED Provider Notes (Addendum)
[facility]   CSN: [id number] Arrival date & time: 06/04/18  2043     History   Chief Complaint Chief Complaint  Patient presents with  . Urinary Tract Infection    HPI Mary Nguyen is a 67 y.o. female.  Patient is a 67 year old female who presents with a possible UTI.  She has burning on urination.  This is been going on for the last few days.  She denies abdominal pain.  No back pain other than she does have some chronic back pain which is unchanged from her baseline.  She denies any fevers.  No vomiting.  She also has a cough which is at times productive of white phlegm.  No shortness of breath.  No chest pain.  No leg swelling.     Past Medical History:  Diagnosis Date  . Blindness   . Bradycardia    a. pt reports hx of slow HR.  Marland Kitchen CAD in native artery    a. pt reports "blockage in the back of her heart" sometime in 2018 by cath at [facility].  . Carotid artery disease (HCC)    a. pt reports "blockage scraped" R neck artery around 2000.  . Cataract   . Former tobacco use   . Heart murmur   . Hyperlipidemia   . PAD (peripheral artery disease) (HCC)    a. she was told she had poor circulation from the waist down.  . Skipped heart beats   . Stroke [facility])    a. 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    Patient Active Problem List   Diagnosis Date Noted  . Angina at rest [facility]) 04/04/2018  . Precordial pain   . History of stroke 04/02/2018  . Hyperlipidemia 04/02/2018  . Unstable angina (HCC) 04/01/2018    Past Surgical History:  Procedure Laterality Date  . LEFT HEART CATH AND CORONARY ANGIOGRAPHY N/A 04/04/2018   Procedure: LEFT HEART CATH AND CORONARY ANGIOGRAPHY;  Surgeon: Lyn Records, MD;  Location: [facility] IN[facility] LAB;  Service: Cardiovascular;  Laterality: N/A;  . TUBAL LIGATION       OB History   None      Home  Medications    Prior to Admission medications   Medication Sig Start Date End Date Taking? Authorizing Provider  acetaminophen (TYLENOL) 500 MG tablet Take 1,000 mg by mouth every 6 (six) hours as needed for moderate pain.   Yes [provider]  acetaminophen (TYLENOL) 500 MG tablet Take 500 mg by mouth every 6 (six) hours as needed for moderate pain.   Yes [provider]  atorvastatin (LIPITOR) 80 MG tablet Take 1 tablet (80 mg total) by mouth daily at 6 PM. 04/06/18   Roberto Scales D, MD  benzonatate (TESSALON) 100 MG capsule Take 1 capsule (100 mg total) by mouth every 8 (eight) hours. 06/05/18   Terrilee Files, MD  gabapentin (NEURONTIN) 300 MG capsule Take 1 capsule (300 mg total) by mouth 3 (three) times daily for 7 days. Patient not taking: Reported on 04/01/2018 03/23/18 03/30/18  Cristina Gong, PA-C  isosorbide mononitrate (IMDUR) 30 MG 24 hr tablet Take 1 tablet (30 mg total) by mouth daily. 04/07/18   Laverna Peace, MD  methocarbamol (ROBAXIN) 500 MG tablet Take 1 tablet (500 mg total) by mouth at bedtime and may repeat dose one time  if needed. Patient not taking: Reported on 06/04/2018 04/22/18   Bethel BornGekas, Kelly Marie, PA-C  Misc. Devices (BATH/SHOWER SEAT) MISC Shower seat 04/06/18   Roberto ScalesNettey, Shayla D, MD  naproxen sodium (ALEVE) 220 MG tablet Take 440 mg by mouth 2 (two) times daily as needed (pain).    [provider]  oxyCODONE-acetaminophen (PERCOCET/ROXICET) 5-325 MG tablet Take 1 tablet by mouth every 4 (four) hours as needed for severe pain. Patient not taking: Reported on 06/04/2018 04/22/18   Bethel BornGekas, Kelly Marie, PA-C  sulfamethoxazole-trimethoprim (BACTRIM DS,SEPTRA DS) 800-160 MG tablet Take 1 tablet by mouth 2 (two) times daily for 7 days. 06/05/18 06/12/18  Terrilee FilesButler, Michael C, MD    Family History Family History  Problem Relation Age of Onset  . Hypertension Mother   . Heart disease Father        details unclear    Social History Social  History   Tobacco Use  . Smoking status: Current Every Day Smoker    Packs/day: 0.50    Years: 10.00    Pack years: 5.00    Types: Cigarettes  . Smokeless tobacco: Never Used  . Tobacco comment: Pt reports she recently quit after 45 years but family says she still smokes  Substance Use Topics  . Alcohol use: Not Currently  . Drug use: Not Currently     Allergies   Penicillins   Review of Systems Review of Systems  Constitutional: Negative for chills, diaphoresis, fatigue and fever.  HENT: Negative for congestion, rhinorrhea and sneezing.   Eyes: Negative.   Respiratory: Positive for cough. Negative for chest tightness and shortness of breath.   Cardiovascular: Negative for chest pain and leg swelling.  Gastrointestinal: Negative for abdominal pain, blood in stool, diarrhea, nausea and vomiting.  Genitourinary: Positive for dysuria. Negative for difficulty urinating, flank pain, frequency and hematuria.  Musculoskeletal: Negative for arthralgias and back pain.  Skin: Negative for rash.  Neurological: Negative for dizziness, speech difficulty, weakness, numbness and headaches.     Physical Exam Updated Vital Signs BP 109/66 (BP Location: Left Arm)   Pulse 70   Temp 98.6 F (37 C) (Oral)   Resp 18   Ht 5\' 2"  (1.575 m)   Wt 40.8 kg (90 lb)   SpO2 96%   BMI 16.46 kg/m   Physical Exam  Constitutional: She is oriented to person, place, and time. She appears well-developed and well-nourished.  HENT:  Head: Normocephalic and atraumatic.  Eyes: Pupils are equal, round, and reactive to light.  Neck: Normal range of motion. Neck supple.  Cardiovascular: Normal rate, regular rhythm and normal heart sounds.  Pulmonary/Chest: Effort normal and breath sounds normal. No respiratory distress. She has no wheezes. She has no rales. She exhibits no tenderness.  Abdominal: Soft. Bowel sounds are normal. There is no tenderness. There is no rebound and no guarding.  Musculoskeletal:  Normal range of motion. She exhibits no edema.  Lymphadenopathy:    She has no cervical adenopathy.  Neurological: She is alert and oriented to person, place, and time.  Skin: Skin is warm and dry. No rash noted.  Psychiatric: She has a normal mood and affect.     ED Treatments / Results  Labs (all labs ordered are listed, but only abnormal results are displayed) Labs Reviewed  URINALYSIS, ROUTINE W REFLEX MICROSCOPIC - Abnormal; Notable for the following components:      Result Value   Color, Urine AMBER (*)    APPearance HAZY (*)    Hgb  urine dipstick SMALL (*)    Bilirubin Urine SMALL (*)    Ketones, ur 5 (*)    Protein, ur 100 (*)    Leukocytes, UA MODERATE (*)    WBC, UA >50 (*)    Bacteria, UA RARE (*)    All other components within normal limits    EKG None  Radiology Dg Chest 2 View  Result Date: 06/04/2018 CLINICAL DATA:  67 year old female with cough. EXAM: CHEST - 2 VIEW COMPARISON:  Chest radiograph dated 04/22/2018 FINDINGS: There is emphysematous changes of the lungs. There is no focal consolidation, pleural effusion, or pneumothorax. An area of density along the inferior aspect of the lateral pleural surface on the right may be superimposition of soft tissues of the chest or represent an area of atelectasis or pleural thickening. No corresponding focal lesion was seen on the CT of 04/01/2018. The cardiac silhouette is within normal limits with no acute osseous pathology. T11 compression fracture similar to prior CT. There is fracture of the superior endplate of L1 with approximately% loss of vertebral body height and anterior wedging, new from prior CT. Correlation with clinical exam and point tenderness recommended. Atherosclerotic calcification of the aorta. IMPRESSION: 1. No acute cardiopulmonary process.  Emphysema. 2. L1 compression fracture, new compared to the prior CT. Correlation with clinical exam and point tenderness recommended. Electronically Signed   By:  Elgie Collard M.D.   On: 06/04/2018 22:12    Procedures Procedures (including critical care time)  Medications Ordered in ED Medications  ibuprofen (ADVIL,MOTRIN) tablet 400 mg (400 mg Oral Given 06/05/18 1010)     Initial Impression / Assessment and Plan / ED Course  I have reviewed the triage vital signs and the nursing notes.  Pertinent labs & imaging results that were available during my care of the patient were reviewed by me and considered in my medical decision making (see chart for details).  Clinical Course as of Jun 05 2046  Wynelle Link Jun 05, 2018  1143 Social work has confirmed that the patient has access to her medicines and the patient states that she has been getting food and feels she is well cared for at home.  She would like to be returned to her home.  Socials will put in a consult to adult protective services to see about getting a safety check.  Patient will be discharged to home.   [MB]    Clinical Course User Index [MB] Terrilee Files, MD    Patient is a 67 year old female who presents with urinary burning.  She does have evidence of a UTI.  She does not appear systemically ill.  No symptoms that would be more concerning for pyelonephritis.  Her chest x-ray is clear without evidence of pneumonia.  She was discharged home in good condition.  She was given prescription for Bactrim and Tessalon Perles for cough.  She was encouraged to follow-up with her PCP.  Return precautions were given.  Pt ended up staying in the ED overnight to see CSW.  I was not made aware that pt did not have a ride home.  PT did not express to me any difficulty with obtaining her medications or getting a ride home.  Final Clinical Impressions(s) / ED Diagnoses   Final diagnoses:  Acute cystitis without hematuria  Cough    ED Discharge Orders        Ordered    benzonatate (TESSALON) 100 MG capsule  Every 8 hours     06/05/18 1208  sulfamethoxazole-trimethoprim (BACTRIM DS,SEPTRA  DS) 800-160 MG tablet  2 times daily     06/05/18 1208    sulfamethoxazole-trimethoprim (BACTRIM DS,SEPTRA DS) 800-160 MG tablet  2 times daily,   Status:  Discontinued     06/04/18 2308    benzonatate (TESSALON) 100 MG capsule  Every 8 hours,   Status:  Discontinued     06/04/18 2308       Rolan Bucco, MD 06/04/18 2312    Rolan Bucco, MD 06/05/18 2248

## 2018-06-05 MED ORDER — SULFAMETHOXAZOLE-TRIMETHOPRIM 800-160 MG PO TABS
1.0000 | ORAL_TABLET | Freq: Two times a day (BID) | ORAL | Status: DC
  Administered 2018-06-05: 1 via ORAL
  Filled 2018-06-05: qty 1

## 2018-06-05 MED ORDER — BENZONATATE 100 MG PO CAPS: 100.0000 mg | ORAL_CAPSULE | Freq: Three times a day (TID) | ORAL | 0 refills | Status: DC

## 2018-06-05 MED ORDER — IBUPROFEN 200 MG PO TABS
400.0000 mg | ORAL_TABLET | Freq: Once | ORAL | Status: AC
  Administered 2018-06-05: 400 mg via ORAL
  Filled 2018-06-05: qty 2

## 2018-06-05 MED ORDER — SULFAMETHOXAZOLE-TRIMETHOPRIM 800-160 MG PO TABS: 1.0000 | ORAL_TABLET | Freq: Two times a day (BID) | ORAL | 0 refills | Status: AC
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
## 2018-06-05 NOTE — ED Notes (Signed)
Patient has no way home, [facility] does not take patients to hotels which is where patient lives. Patient bedded for the night.
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
## 2018-07-01 ENCOUNTER — Ambulatory Visit: Payer: Medicare Other | Admitting: Family Medicine

## 2018-08-30 ENCOUNTER — Emergency Department (HOSPITAL_COMMUNITY): Payer: Medicare Other

## 2018-08-30 ENCOUNTER — Other Ambulatory Visit: Payer: Self-pay

## 2018-08-30 ENCOUNTER — Encounter (HOSPITAL_COMMUNITY): Payer: Self-pay | Admitting: Emergency Medicine

## 2018-08-30 ENCOUNTER — Inpatient Hospital Stay (HOSPITAL_COMMUNITY)
Admission: EM | Admit: 2018-08-30 | Discharge: 2018-09-07 | DRG: 193 | Disposition: A | Discharge status: Skilled Nursing Facility | Payer: Medicare Other | Attending: Family Medicine | Admitting: Family Medicine

## 2018-08-30 DIAGNOSIS — L97529 Non-pressure chronic ulcer of other part of left foot with unspecified severity: Secondary | ICD-10-CM | POA: Diagnosis present

## 2018-08-30 DIAGNOSIS — R54 Age-related physical debility: Secondary | ICD-10-CM | POA: Diagnosis present

## 2018-08-30 DIAGNOSIS — J181 Lobar pneumonia, unspecified organism: Principal | ICD-10-CM | POA: Diagnosis present

## 2018-08-30 DIAGNOSIS — Z791 Long term (current) use of non-steroidal anti-inflammatories (NSAID): Secondary | ICD-10-CM

## 2018-08-30 DIAGNOSIS — Z515 Encounter for palliative care: Secondary | ICD-10-CM

## 2018-08-30 DIAGNOSIS — M4854XA Collapsed vertebra, not elsewhere classified, thoracic region, initial encounter for fracture: Secondary | ICD-10-CM | POA: Diagnosis present

## 2018-08-30 DIAGNOSIS — R079 Chest pain, unspecified: Secondary | ICD-10-CM | POA: Diagnosis not present

## 2018-08-30 DIAGNOSIS — Z9119 Patient's noncompliance with other medical treatment and regimen: Secondary | ICD-10-CM

## 2018-08-30 DIAGNOSIS — I25110 Atherosclerotic heart disease of native coronary artery with unstable angina pectoris: Secondary | ICD-10-CM | POA: Diagnosis present

## 2018-08-30 DIAGNOSIS — I739 Peripheral vascular disease, unspecified: Secondary | ICD-10-CM

## 2018-08-30 DIAGNOSIS — E876 Hypokalemia: Secondary | ICD-10-CM

## 2018-08-30 DIAGNOSIS — T7601XA Adult neglect or abandonment, suspected, initial encounter: Secondary | ICD-10-CM | POA: Diagnosis present

## 2018-08-30 DIAGNOSIS — R300 Dysuria: Secondary | ICD-10-CM | POA: Diagnosis present

## 2018-08-30 DIAGNOSIS — Z681 Body mass index (BMI) 19 or less, adult: Secondary | ICD-10-CM

## 2018-08-30 DIAGNOSIS — F17210 Nicotine dependence, cigarettes, uncomplicated: Secondary | ICD-10-CM | POA: Diagnosis present

## 2018-08-30 DIAGNOSIS — Z23 Encounter for immunization: Secondary | ICD-10-CM

## 2018-08-30 DIAGNOSIS — R627 Adult failure to thrive: Secondary | ICD-10-CM

## 2018-08-30 DIAGNOSIS — Z8249 Family history of ischemic heart disease and other diseases of the circulatory system: Secondary | ICD-10-CM

## 2018-08-30 DIAGNOSIS — H543 Unqualified visual loss, both eyes: Secondary | ICD-10-CM | POA: Diagnosis present

## 2018-08-30 DIAGNOSIS — Z79899 Other long term (current) drug therapy: Secondary | ICD-10-CM

## 2018-08-30 DIAGNOSIS — Z7189 Other specified counseling: Secondary | ICD-10-CM

## 2018-08-30 DIAGNOSIS — D751 Secondary polycythemia: Secondary | ICD-10-CM | POA: Diagnosis present

## 2018-08-30 DIAGNOSIS — Z880 Allergy status to penicillin: Secondary | ICD-10-CM

## 2018-08-30 DIAGNOSIS — M4856XA Collapsed vertebra, not elsewhere classified, lumbar region, initial encounter for fracture: Secondary | ICD-10-CM | POA: Diagnosis present

## 2018-08-30 DIAGNOSIS — I493 Ventricular premature depolarization: Secondary | ICD-10-CM | POA: Diagnosis present

## 2018-08-30 DIAGNOSIS — G629 Polyneuropathy, unspecified: Secondary | ICD-10-CM | POA: Diagnosis present

## 2018-08-30 DIAGNOSIS — Z9114 Patient's other noncompliance with medication regimen: Secondary | ICD-10-CM

## 2018-08-30 DIAGNOSIS — M25512 Pain in left shoulder: Secondary | ICD-10-CM | POA: Diagnosis present

## 2018-08-30 DIAGNOSIS — M810 Age-related osteoporosis without current pathological fracture: Secondary | ICD-10-CM | POA: Diagnosis present

## 2018-08-30 DIAGNOSIS — Z9181 History of falling: Secondary | ICD-10-CM

## 2018-08-30 DIAGNOSIS — J189 Pneumonia, unspecified organism: Secondary | ICD-10-CM | POA: Diagnosis present

## 2018-08-30 DIAGNOSIS — E785 Hyperlipidemia, unspecified: Secondary | ICD-10-CM | POA: Diagnosis present

## 2018-08-30 DIAGNOSIS — E43 Unspecified severe protein-calorie malnutrition: Secondary | ICD-10-CM | POA: Diagnosis present

## 2018-08-30 DIAGNOSIS — A044 Other intestinal Escherichia coli infections: Secondary | ICD-10-CM | POA: Diagnosis present

## 2018-08-30 DIAGNOSIS — F329 Major depressive disorder, single episode, unspecified: Secondary | ICD-10-CM

## 2018-08-30 DIAGNOSIS — F32A Depression, unspecified: Secondary | ICD-10-CM

## 2018-08-30 DIAGNOSIS — Z8673 Personal history of transient ischemic attack (TIA), and cerebral infarction without residual deficits: Secondary | ICD-10-CM

## 2018-08-30 DIAGNOSIS — K219 Gastro-esophageal reflux disease without esophagitis: Secondary | ICD-10-CM | POA: Diagnosis present

## 2018-08-30 LAB — CBC
HCT: 55.4 % — ABNORMAL HIGH (ref 36.0–46.0)
Hemoglobin: 18.5 g/dL — ABNORMAL HIGH (ref 12.0–15.0)
MCH: 32 pg (ref 26.0–34.0)
MCHC: 33.4 g/dL (ref 30.0–36.0)
MCV: 95.7 fL (ref 78.0–100.0)
PLATELETS: 124 10*3/uL — AB (ref 150–400)
RBC: 5.79 MIL/uL — ABNORMAL HIGH (ref 3.87–5.11)
RDW: 13.6 % (ref 11.5–15.5)
WBC: 10.4 10*3/uL (ref 4.0–10.5)

## 2018-08-30 LAB — HEPATIC FUNCTION PANEL
ALT: 10 U/L (ref 0–44)
AST: 20 U/L (ref 15–41)
Albumin: 3.5 g/dL (ref 3.5–5.0)
Alkaline Phosphatase: 99 U/L (ref 38–126)
BILIRUBIN INDIRECT: 0.9 mg/dL (ref 0.3–0.9)
Bilirubin, Direct: 0.2 mg/dL (ref 0.0–0.2)
TOTAL PROTEIN: 7.5 g/dL (ref 6.5–8.1)
Total Bilirubin: 1.1 mg/dL (ref 0.3–1.2)

## 2018-08-30 LAB — URINALYSIS, ROUTINE W REFLEX MICROSCOPIC
Bilirubin Urine: NEGATIVE
Glucose, UA: NEGATIVE mg/dL
KETONES UR: 20 mg/dL — AB
NITRITE: NEGATIVE
PROTEIN: 100 mg/dL — AB
Specific Gravity, Urine: 1.033 — ABNORMAL HIGH (ref 1.005–1.030)
pH: 5 (ref 5.0–8.0)

## 2018-08-30 LAB — BASIC METABOLIC PANEL
Anion gap: 16 — ABNORMAL HIGH (ref 5–15)
BUN: 6 mg/dL — AB (ref 8–23)
CO2: 19 mmol/L — AB (ref 22–32)
CREATININE: 0.8 mg/dL (ref 0.44–1.00)
Calcium: 8.9 mg/dL (ref 8.9–10.3)
Chloride: 108 mmol/L (ref 98–111)
GFR calc Af Amer: >60 mL/min (ref >60)
GFR calc non Af Amer: >60 mL/min (ref >60)
Glucose, Bld: 93 mg/dL (ref 70–99)
Potassium: 2.6 mmol/L — CL (ref 3.5–5.1)
Sodium: 143 mmol/L (ref 135–145)

## 2018-08-30 LAB — I-STAT TROPONIN, ED
TROPONIN I, POC: 0 ng/mL (ref 0.00–0.08)
Troponin i, poc: 0 ng/mL (ref 0.00–0.08)

## 2018-08-30 LAB — LIPASE, BLOOD: Lipase: 20 U/L (ref 11–51)

## 2018-08-30 MED ORDER — LEVOFLOXACIN IN D5W 750 MG/150ML IV SOLN
750.0000 mg | Freq: Once | INTRAVENOUS | Status: AC
  Administered 2018-08-30: 750 mg via INTRAVENOUS
  Filled 2018-08-30: qty 150

## 2018-08-30 MED ORDER — IOPAMIDOL (ISOVUE-370) INJECTION 76%
INTRAVENOUS | Status: AC
  Filled 2018-08-30: qty 100

## 2018-08-30 MED ORDER — POTASSIUM CHLORIDE 10 MEQ/100ML IV SOLN
10.0000 meq | INTRAVENOUS | Status: AC
  Administered 2018-08-30 (×3): 10 meq via INTRAVENOUS
  Filled 2018-08-30 (×3): qty 100

## 2018-08-30 MED ORDER — POTASSIUM CHLORIDE CRYS ER 20 MEQ PO TBCR
40.0000 meq | EXTENDED_RELEASE_TABLET | Freq: Once | ORAL | Status: AC
  Administered 2018-08-30: 40 meq via ORAL
  Filled 2018-08-30: qty 2

## 2018-08-30 MED ORDER — SODIUM CHLORIDE 0.9 % IV SOLN
Freq: Once | INTRAVENOUS | Status: AC
  Administered 2018-08-30: 21:00:00 via INTRAVENOUS

## 2018-08-30 MED ORDER — IOPAMIDOL (ISOVUE-370) INJECTION 76%
100.0000 mL | Freq: Once | INTRAVENOUS | Status: AC | PRN
  Administered 2018-08-30: 100 mL via INTRAVENOUS

## 2018-08-30 MED ORDER — SODIUM CHLORIDE 0.9 % IV BOLUS
500.0000 mL | Freq: Once | INTRAVENOUS | Status: AC
  Administered 2018-08-30: 500 mL via INTRAVENOUS
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
## 2018-08-30 NOTE — ED Notes (Signed)
Pt given graham crackers and sprite per EDP, Elizabeth.

## 2018-08-30 NOTE — ED Notes (Addendum)
Attempted to stick for blood two times. Unsuccessful. IV from EMS will not pull back, flushed.

## 2018-08-30 NOTE — ED Provider Notes (Addendum)
[facility] EMERGENCY [facility]MENT Provider Note   CSN: [id number] Arrival date & time: 08/30/18  1542     History   Chief Complaint Chief Complaint  Patient presents with  . Chest Pain    HPI Mary Nguyen is a 67 y.o. female with past medical history of multiple strokes, PAD to the point that she was unable to have a suitable artery found for a heart cath, CAD, bradycardia, blindness, who presents today for evaluation of chest pain.  She reports that for the past day she has had chest and back pain with nausea and shortness of breath.  She denies any recent fevers, has been occasionally coughing.  She does report that her legs are swollen, her left more than her right.  She reports that she has had poor appetite recently, has had food available for her in the house and water, however has just not been eating it.  She reports that she has had diarrhea for the past week, and was told by 1 of her caregivers that she smelled like "death warmed over."  Reports abdominal pain, primarily in the left lower quadrant.  HPI  Past Medical History:  Diagnosis Date  . Blindness   . Bradycardia    a. pt reports hx of slow HR.  Marland Kitchen. CAD in native artery    a. pt reports "blockage in the back of her heart" sometime in [facility].  . Carotid artery disease (HCC)    a. pt reports "blockage scraped" R neck artery around 2000.  . Cataract   . Former tobacco use   . Heart murmur   . Hyperlipidemia   . PAD (peripheral artery disease) (HCC)    a. she was told she had poor circulation from the waist down.  . Skipped heart beats   . Stroke [facility](HCC)    a. 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    Patient Active Problem List   Diagnosis Date Noted  . Angina at rest [facility](HCC) 04/04/2018  . Precordial pain   . History of stroke 04/02/2018  . Hyperlipidemia 04/02/2018  . Unstable angina (HCC)  04/01/2018    Past Surgical History:  Procedure Laterality Date  . LEFT HEART CATH AND CORONARY ANGIOGRAPHY N/A 04/04/2018   Procedure: LEFT HEART CATH AND CORONARY ANGIOGRAPHY;  Surgeon: Lyn RecordsSmith, Henry W, MD;  Location: [facility] INVASIVE CV LAB;  Service: Cardiovascular;  Laterality: N/A;  . TUBAL LIGATION       OB History   None      Home Medications    Prior to Admission medications   Medication Sig Start Date End Date Taking? Authorizing Provider  atorvastatin (LIPITOR) 80 MG tablet Take 1 tablet (80 mg total) by mouth daily at 6 PM. Patient not taking: Reported on 08/30/2018 04/06/18   Laverna PeaceNettey, Shayla D, MD  benzonatate (TESSALON) 100 MG capsule Take 1 capsule (100 mg total) by mouth every 8 (eight) hours. Patient not taking: Reported on 08/30/2018 06/05/18   Terrilee FilesButler, Michael C, MD  gabapentin (NEURONTIN) 300 MG capsule Take 1 capsule (300 mg total) by mouth 3 (three) times daily for 7 days. Patient not taking: Reported on 04/01/2018 03/23/18 03/30/18  Cristina GongHammond, Shaun Runyon W, PA-C  isosorbide mononitrate (IMDUR) 30 MG 24 hr tablet Take 1 tablet (30 mg total) by mouth daily. Patient not taking: Reported on 08/30/2018 04/07/18   Roberto ScalesNettey, Shayla  D, MD  methocarbamol (ROBAXIN) 500 MG tablet Take 1 tablet (500 mg total) by mouth at bedtime and may repeat dose one time if needed. Patient not taking: Reported on 06/04/2018 04/22/18   Bethel Born, PA-C  Misc. Devices (BATH/SHOWER SEAT) MISC Shower seat 04/06/18   Roberto Scales D, MD  naproxen sodium (ALEVE) 220 MG tablet Take 440 mg by mouth 2 (two) times daily as needed (pain).    [provider]  oxyCODONE-acetaminophen (PERCOCET/ROXICET) 5-325 MG tablet Take 1 tablet by mouth every 4 (four) hours as needed for severe pain. Patient not taking: Reported on 06/04/2018 04/22/18   Bethel Born, PA-C    Family History Family History  Problem Relation Age of Onset  . Hypertension Mother   . Heart disease Father        details unclear     Social History Social History   Tobacco Use  . Smoking status: Current Every Day Smoker    Packs/day: 0.50    Years: 10.00    Pack years: 5.00    Types: Cigarettes  . Smokeless tobacco: Never Used  . Tobacco comment: Pt reports she recently quit after 45 years but family says she still smokes  Substance Use Topics  . Alcohol use: Not Currently  . Drug use: Not Currently     Allergies   Penicillins   Review of Systems Review of Systems  Constitutional: Negative for chills and fever.  HENT: Negative for ear pain and sore throat.   Eyes: Negative for pain and visual disturbance.  Respiratory: Positive for chest tightness and shortness of breath. Negative for cough.   Cardiovascular: Positive for chest pain. Negative for palpitations.  Gastrointestinal: Positive for abdominal pain, diarrhea and nausea. Negative for vomiting.  Genitourinary: Negative for dysuria and hematuria.  Musculoskeletal: Positive for back pain. Negative for arthralgias.  Skin: Negative for color change and rash.  Neurological: Negative for seizures and syncope.  All other systems reviewed and are negative.    Physical Exam Updated Vital Signs BP (!) 169/94   Pulse (!) 118   Temp 97.8 F (36.6 C) (Oral)   Resp 17   Ht 5\' 2"  (1.575 m)   SpO2 (!) 88%   BMI 16.46 kg/m   Physical Exam  Constitutional: No distress.  Frail, appears malnourished, older than stated age  HENT:  Head: Normocephalic and atraumatic.  Eyes: Conjunctivae are normal.  Neck: Neck supple.  Cardiovascular: Normal rate and regular rhythm.  No murmur heard. Pulses:      Dorsalis pedis pulses are 1+ on the right side, and 1+ on the left side.  Pulmonary/Chest: Effort normal. No respiratory distress. She has rhonchi in the right middle field.  Abdominal: Soft. There is tenderness (Diffusely).  Musculoskeletal: She exhibits no edema.  Neurological: She is alert.  Skin: Skin is warm and dry.  Psychiatric: She has a  normal mood and affect.  Nursing note and vitals reviewed.    ED Treatments / Results  Labs (all labs ordered are listed, but only abnormal results are displayed) Labs Reviewed  BASIC METABOLIC PANEL - Abnormal; Notable for the following components:      Result Value   Potassium 2.6 (*)    CO2 19 (*)    BUN 6 (*)    Anion gap 16 (*)    All other components within normal limits  CBC - Abnormal; Notable for the following components:   RBC 5.79 (*)    Hemoglobin 18.5 (*)  HCT 55.4 (*)    Platelets 124 (*)    All other components within normal limits  URINALYSIS, ROUTINE W REFLEX MICROSCOPIC  HEPATIC FUNCTION PANEL  LIPASE, BLOOD  I-STAT TROPONIN, ED    EKG EKG Interpretation  Date/Time:  Tuesday August 30 2018 16:14:14 EDT Ventricular Rate:  60 PR Interval:  134 QRS Duration: 84 QT Interval:  466 QTC Calculation: 466 R Axis:   73 Text Interpretation:  Sinus bradycardia with Premature supraventricular complexes Left ventricular hypertrophy with repolarization abnormality Abnormal ECG Confirmed by Lorre Nick ([id number]) on 08/30/2018 7:47:51 PM   Radiology Dg Chest 2 View  Result Date: 08/30/2018 CLINICAL DATA:  Chest pain EXAM: CHEST - 2 VIEW COMPARISON:  06/04/2018 chest radiograph. FINDINGS: Stable cardiomediastinal silhouette with normal heart size. No pneumothorax. No pleural effusion. Hyperinflated lungs. No pulmonary edema. There is new bandlike opacity in the right middle lobe. Otherwise no consolidative airspace disease. IMPRESSION: 1. New bandlike opacity in the right middle lobe, which could represent atelectasis and/or pneumonia. Recommend follow-up PA and lateral post treatment chest radiographs in 4-6 weeks. 2. Hyperinflated lungs, suggesting COPD. Electronically Signed   By: Delbert Phenix M.D.   On: 08/30/2018 19:02    Procedures Procedures (including critical care time)  Medications Ordered in ED Medications  potassium chloride 10 mEq in 100 mL IVPB  (has no administration in time range)  sodium chloride 0.9 % bolus 500 mL (has no administration in time range)  potassium chloride SA (K-DUR,KLOR-CON) CR tablet 40 mEq (40 mEq Oral Given 08/30/18 1943)     Initial Impression / Assessment and Plan / ED Course  I have reviewed the triage vital signs and the nursing notes.  Pertinent labs & imaging results that were available during my care of the patient were reviewed by me and considered in my medical decision making (see chart for details).  Clinical Course as of Sep 01 51  Wed Aug 31, 2018  0006 Spoke with resident who is coming to admit patient.   [EH]    Clinical Course User Index [EH] Cristina Gong, PA-C   Patient presents today for evaluation of cough, chest pain, shortness of breath and abdominal pain.  Chest x-ray showing concern for pneumonia.  Patient is not have a significant leukocytosis, and is afebrile here, raising concern that the area of chest x-ray that may be pneumonia may actually represent infarction from a PE.  She appears generally frail, however does have questionable left lower leg swelling and calf pain.  CT angios PE was obtained without evidence of PE, is consistent with multifocal pneumonia including possible atypical.  She is allergic to penicillins, therefore was given Levaquin after EKG revealed no prolonged QT.  Her CBC was significant for hemoglobin of 18.5, concerning for dehydration.  Her potassium was low at 2.6.  She has been having multiple bouts of diarrhea that is been extremely foul-smelling.  Her abdomen was generally tender to palpation, therefore CT abdomen was obtained without cause for her symptoms found.  C. difficile and GI PCR stool testing was sent, her lab stool was formed, therefore does not meet testing requirements for C. Difficile.    Patient was given 40 M EQ p.o. potassium along with 3 rounds of IV potassium.  Given the combination of pneumonia, hypokalemia, and patients generally  frail state patient will be admitted to hospital.  I spoke with IMTS resident who agreed to admit patient.   This patient was seen as a shared visit with Dr.  Freida Busman who evaluated the patient and agreed with plan to admit.  Final Clinical Impressions(s) / ED Diagnoses   Final diagnoses:  Hypokalemia  Community acquired pneumonia, unspecified laterality  Multifocal pneumonia    ED Discharge Orders    None       Norman Clay 08/31/18 0057    Lorre Nick, MD 08/31/18 1503     Addendum: 09/15/18  CRITICAL CARE Performed by: Lyndel Safe Total critical care time: 40 minutes Critical care time was exclusive of separately billable procedures and treating other patients. Critical care was necessary to treat or prevent imminent or life-threatening deterioration. Critical care was time spent personally by me on the following activities: development of treatment plan with patient and/or surrogate as well as nursing, discussions with consultants, evaluation of patient's response to treatment, examination of patient, obtaining history from patient or surrogate, ordering and performing treatments and interventions, ordering and review of laboratory studies, ordering and review of radiographic studies, pulse oximetry and re-evaluation of patient's condition.        Cristina Gong, PA-C 09/15/18 0418    Lorre Nick, MD 09/16/18 [phone]

## 2018-08-30 NOTE — ED Provider Notes (Signed)
Medical screening examination/treatment/procedure(s) were conducted as a shared visit with non-physician practitioner(s) and myself.  I personally evaluated the patient during the encounter.  EKG Interpretation  Date/Time:  Tuesday August 30 2018 16:14:14 EDT Ventricular Rate:  60 PR Interval:  134 QRS Duration: 84 QT Interval:  466 QTC Calculation: 466 R Axis:   73 Text Interpretation:  Sinus bradycardia with Premature supraventricular complexes Left ventricular hypertrophy with repolarization abnormality Abnormal ECG Confirmed by Lorre NickAllen, Devery Murgia ([id number]) on 08/30/2018 [id number] PM 67 year old female with cough and chest pain and shortness of breath.  Chest CT consistent with pneumonia.  Patient started on antibiotics will be admitted to the hospitalist service   Lorre NickAllen, Tanja Gift, MD 08/30/18 2304

## 2018-08-31 ENCOUNTER — Other Ambulatory Visit: Payer: Self-pay

## 2018-08-31 DIAGNOSIS — E785 Hyperlipidemia, unspecified: Secondary | ICD-10-CM | POA: Diagnosis present

## 2018-08-31 DIAGNOSIS — R300 Dysuria: Secondary | ICD-10-CM | POA: Diagnosis present

## 2018-08-31 DIAGNOSIS — M8000XA Age-related osteoporosis with current pathological fracture, unspecified site, initial encounter for fracture: Secondary | ICD-10-CM | POA: Insufficient documentation

## 2018-08-31 DIAGNOSIS — Z609 Problem related to social environment, unspecified: Secondary | ICD-10-CM

## 2018-08-31 DIAGNOSIS — Z681 Body mass index (BMI) 19 or less, adult: Secondary | ICD-10-CM | POA: Diagnosis not present

## 2018-08-31 DIAGNOSIS — R627 Adult failure to thrive: Secondary | ICD-10-CM | POA: Diagnosis present

## 2018-08-31 DIAGNOSIS — H547 Unspecified visual loss: Secondary | ICD-10-CM | POA: Insufficient documentation

## 2018-08-31 DIAGNOSIS — M4856XA Collapsed vertebra, not elsewhere classified, lumbar region, initial encounter for fracture: Secondary | ICD-10-CM | POA: Diagnosis present

## 2018-08-31 DIAGNOSIS — L97529 Non-pressure chronic ulcer of other part of left foot with unspecified severity: Secondary | ICD-10-CM | POA: Diagnosis present

## 2018-08-31 DIAGNOSIS — E43 Unspecified severe protein-calorie malnutrition: Secondary | ICD-10-CM

## 2018-08-31 DIAGNOSIS — M25512 Pain in left shoulder: Secondary | ICD-10-CM | POA: Diagnosis present

## 2018-08-31 DIAGNOSIS — E876 Hypokalemia: Secondary | ICD-10-CM | POA: Diagnosis present

## 2018-08-31 DIAGNOSIS — I209 Angina pectoris, unspecified: Secondary | ICD-10-CM | POA: Diagnosis not present

## 2018-08-31 DIAGNOSIS — R079 Chest pain, unspecified: Secondary | ICD-10-CM | POA: Diagnosis present

## 2018-08-31 DIAGNOSIS — M810 Age-related osteoporosis without current pathological fracture: Secondary | ICD-10-CM | POA: Diagnosis present

## 2018-08-31 DIAGNOSIS — F329 Major depressive disorder, single episode, unspecified: Secondary | ICD-10-CM | POA: Diagnosis present

## 2018-08-31 DIAGNOSIS — K219 Gastro-esophageal reflux disease without esophagitis: Secondary | ICD-10-CM | POA: Diagnosis present

## 2018-08-31 DIAGNOSIS — M4854XA Collapsed vertebra, not elsewhere classified, thoracic region, initial encounter for fracture: Secondary | ICD-10-CM | POA: Diagnosis present

## 2018-08-31 DIAGNOSIS — I739 Peripheral vascular disease, unspecified: Secondary | ICD-10-CM | POA: Diagnosis present

## 2018-08-31 DIAGNOSIS — J181 Lobar pneumonia, unspecified organism: Secondary | ICD-10-CM | POA: Diagnosis present

## 2018-08-31 DIAGNOSIS — Z9181 History of falling: Secondary | ICD-10-CM | POA: Diagnosis not present

## 2018-08-31 DIAGNOSIS — I25110 Atherosclerotic heart disease of native coronary artery with unstable angina pectoris: Secondary | ICD-10-CM | POA: Diagnosis present

## 2018-08-31 DIAGNOSIS — Z23 Encounter for immunization: Secondary | ICD-10-CM | POA: Diagnosis present

## 2018-08-31 DIAGNOSIS — E46 Unspecified protein-calorie malnutrition: Secondary | ICD-10-CM | POA: Insufficient documentation

## 2018-08-31 DIAGNOSIS — D751 Secondary polycythemia: Secondary | ICD-10-CM | POA: Diagnosis present

## 2018-08-31 DIAGNOSIS — T7601XA Adult neglect or abandonment, suspected, initial encounter: Secondary | ICD-10-CM | POA: Diagnosis present

## 2018-08-31 DIAGNOSIS — Z7189 Other specified counseling: Secondary | ICD-10-CM | POA: Diagnosis not present

## 2018-08-31 DIAGNOSIS — G629 Polyneuropathy, unspecified: Secondary | ICD-10-CM | POA: Diagnosis present

## 2018-08-31 DIAGNOSIS — J189 Pneumonia, unspecified organism: Secondary | ICD-10-CM | POA: Diagnosis not present

## 2018-08-31 DIAGNOSIS — I493 Ventricular premature depolarization: Secondary | ICD-10-CM | POA: Diagnosis present

## 2018-08-31 DIAGNOSIS — A044 Other intestinal Escherichia coli infections: Secondary | ICD-10-CM | POA: Diagnosis present

## 2018-08-31 DIAGNOSIS — Z515 Encounter for palliative care: Secondary | ICD-10-CM | POA: Diagnosis not present

## 2018-08-31 DIAGNOSIS — Z720 Tobacco use: Secondary | ICD-10-CM | POA: Diagnosis not present

## 2018-08-31 DIAGNOSIS — R54 Age-related physical debility: Secondary | ICD-10-CM | POA: Diagnosis present

## 2018-08-31 LAB — BASIC METABOLIC PANEL
ANION GAP: 10 (ref 5–15)
Anion gap: 11 (ref 5–15)
BUN: 5 mg/dL — AB (ref 8–23)
BUN: 6 mg/dL — ABNORMAL LOW (ref 8–23)
CALCIUM: 8.1 mg/dL — AB (ref 8.9–10.3)
CO2: 22 mmol/L (ref 22–32)
CO2: 22 mmol/L (ref 22–32)
CREATININE: 0.64 mg/dL (ref 0.44–1.00)
Calcium: 8.2 mg/dL — ABNORMAL LOW (ref 8.9–10.3)
Chloride: 109 mmol/L (ref 98–111)
Chloride: 108 mmol/L (ref 98–111)
Creatinine, Ser: 0.66 mg/dL (ref 0.44–1.00)
GFR calc Af Amer: >60 mL/min (ref >60)
Glucose, Bld: 90 mg/dL (ref 70–99)
Glucose, Bld: 91 mg/dL (ref 70–99)
Potassium: 4 mmol/L (ref 3.5–5.1)
Potassium: 3.6 mmol/L (ref 3.5–5.1)
SODIUM: 141 mmol/L (ref 135–145)
Sodium: 141 mmol/L (ref 135–145)

## 2018-08-31 LAB — GASTROINTESTINAL PANEL BY PCR, STOOL (REPLACES STOOL CULTURE)
ADENOVIRUS F40/41: NOT DETECTED
Astrovirus: NOT DETECTED
CRYPTOSPORIDIUM: NOT DETECTED
Campylobacter species: NOT DETECTED
Cyclospora cayetanensis: NOT DETECTED
ENTAMOEBA HISTOLYTICA: NOT DETECTED
ENTEROAGGREGATIVE E COLI (EAEC): DETECTED — AB
ENTEROPATHOGENIC E COLI (EPEC): NOT DETECTED
Enterotoxigenic E coli (ETEC): NOT DETECTED
GIARDIA LAMBLIA: NOT DETECTED
Norovirus GI/GII: NOT DETECTED
Plesimonas shigelloides: NOT DETECTED
Rotavirus A: NOT DETECTED
SALMONELLA SPECIES: NOT DETECTED
SHIGELLA/ENTEROINVASIVE E COLI (EIEC): NOT DETECTED
Sapovirus (I, II, IV, and V): NOT DETECTED
Shiga like toxin producing E coli (STEC): NOT DETECTED
VIBRIO CHOLERAE: NOT DETECTED
VIBRIO SPECIES: NOT DETECTED
YERSINIA ENTEROCOLITICA: NOT DETECTED

## 2018-08-31 LAB — CBC
HCT: 40.5 % (ref 36.0–46.0)
Hemoglobin: 13 g/dL (ref 12.0–15.0)
MCH: 31.8 pg (ref 26.0–34.0)
MCHC: 32.1 g/dL (ref 30.0–36.0)
MCV: 99 fL (ref 78.0–100.0)
PLATELETS: 235 10*3/uL (ref 150–400)
RBC: 4.09 MIL/uL (ref 3.87–5.11)
RDW: 13.8 % (ref 11.5–15.5)
WBC: 10.1 10*3/uL (ref 4.0–10.5)

## 2018-08-31 LAB — STREP PNEUMONIAE URINARY ANTIGEN: STREP PNEUMO URINARY ANTIGEN: NEGATIVE

## 2018-08-31 LAB — HIV ANTIBODY (ROUTINE TESTING W REFLEX): HIV Screen 4th Generation wRfx: NONREACTIVE

## 2018-08-31 MED ORDER — ACETAMINOPHEN 325 MG PO TABS
650.0000 mg | ORAL_TABLET | Freq: Four times a day (QID) | ORAL | Status: DC | PRN
  Administered 2018-09-01 (×2): 650 mg via ORAL
  Filled 2018-08-31 (×2): qty 2

## 2018-08-31 MED ORDER — ISOSORBIDE MONONITRATE ER 30 MG PO TB24
30.0000 mg | ORAL_TABLET | Freq: Every day | ORAL | Status: DC
  Administered 2018-08-31 – 2018-09-07 (×8): 30 mg via ORAL
  Filled 2018-08-31 (×8): qty 1

## 2018-08-31 MED ORDER — LEVOFLOXACIN 500 MG PO TABS
500.0000 mg | ORAL_TABLET | ORAL | Status: AC
  Administered 2018-08-31 – 2018-09-03 (×4): 500 mg via ORAL
  Filled 2018-08-31 (×4): qty 1

## 2018-08-31 MED ORDER — LEVOFLOXACIN 750 MG PO TABS: 750.0000 mg | ORAL_TABLET | ORAL | Status: DC

## 2018-08-31 MED ORDER — ASPIRIN EC 81 MG PO TBEC
81.0000 mg | DELAYED_RELEASE_TABLET | Freq: Every day | ORAL | Status: DC
  Administered 2018-08-31 – 2018-09-07 (×8): 81 mg via ORAL
  Filled 2018-08-31 (×8): qty 1

## 2018-08-31 MED ORDER — ENOXAPARIN SODIUM 300 MG/3ML IJ SOLN
20.0000 mg | INTRAMUSCULAR | Status: DC
  Administered 2018-08-31 – 2018-09-07 (×7): 20 mg via SUBCUTANEOUS
  Filled 2018-08-31 (×9): qty 0.2

## 2018-08-31 MED ORDER — ORAL CARE MOUTH RINSE
15.0000 mL | Freq: Two times a day (BID) | OROMUCOSAL | Status: DC
  Administered 2018-08-31 – 2018-09-07 (×10): 15 mL via OROMUCOSAL

## 2018-08-31 MED ORDER — INFLUENZA VAC SPLIT HIGH-DOSE 0.5 ML IM SUSY
0.5000 mL | PREFILLED_SYRINGE | INTRAMUSCULAR | Status: AC
  Administered 2018-09-01: 0.5 mL via INTRAMUSCULAR
  Filled 2018-08-31: qty 0.5

## 2018-08-31 MED ORDER — ATORVASTATIN CALCIUM 80 MG PO TABS
80.0000 mg | ORAL_TABLET | Freq: Every day | ORAL | Status: DC
  Administered 2018-08-31 – 2018-09-07 (×7): 80 mg via ORAL
  Filled 2018-08-31 (×7): qty 1

## 2018-08-31 NOTE — ED Notes (Signed)
Report taken from night shift RN - care assumed at this time; resting quietly on stretcher with eyes closed; ccm showing SR rate 64 with frequent PACs; pt denies any complaints at this time - awaiting inpt bed assignment  - pt aware of same
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
## 2018-08-31 NOTE — ED Notes (Addendum)
Spoke with [name] about bowel movements and stomach pain. Unable to give medications due to possible infection. Will place heating pads on stomach.

## 2018-08-31 NOTE — Consult Note (Addendum)
Cardiology Consultation:   Patient ID: Mary Nguyen; [id number]; 12/02/1951   Admit date: 08/30/2018 Date of Consult: 08/31/2018  Primary Care Provider: Patient, No Pcp Per Primary Cardiologist: Dr. Rennis Golden, MD   Patient Profile:   Mary Nguyen is a 67 y.o. female with a hx of suspected coronary artery disease (known vasculopath), peripheral arterial disease, multiple strokes, blindness, untreated dyslipidemia, significant carotid artery disease, hx of chronic chest pain and bradycardia who is being seen today for the evaluation of chest pain and bradycardia at the request of Dr. Constance Goltz.   History of Present Illness:   Mary Nguyen is a very complicated 67 year old female with a history stated above who presented to [facility] on 08/30/2018 with complaints of resting chest pain with radiation to left shoulder without associated symptoms.  She has been evaluated on multiple occasions (in [facility] and at [facility] in 03/2018) for chest pain without definitive evidence of CAD secondary to severe vasculopath and inability to gain access during heart cath procedure.   She was last seen by our team 03/2018 after hospitalization for chest pain and bradycardia. Per chart review, she had been living in a long-term healthcare facility in [place], [place] and reported that less than a year prior she had a heart catheterization at [facility] with difficult access through either femoral or radial approaches. She ultimately was determined to require medical therapy and did not receive a stent. She then moved with her family  March 2019 to [facility] and presented to [facility] with left-sided facial droop, slurred speech and left hand paresthesias along with left-sided chest pain and upper back pain. Her neurologic issues apparently resolved on their own. She was then sent home and returned one day later after developing chest pain. She received 3 SL NTG and became hypotensive however, her chest pain resolved.   EKG at that time showed NSR with nonspecific ST and T wave changes and she was noted to be bradycardic with a HR in the 30's however was asymptomatic. Per chart review, recommendations at that time were for definitive cardiac catheterization for further assessment.   Unfortunately, heart cath was attempt on 04/04/18 however had to be aborted due to poor vascular access. The decision was made to pursue medical management of angina and she was started on low-dose isosorbide mononitrate. No beta-blocker was started secondary to intermittent bradycardia. She was set up for close follow-up after discharge with an appointment on 04/12/2018 however, did not show for this appointment and she has not been seen since this time.   During our interview today, she states that she has been living in a hotel and is being cared for by a "caretaker".  She states that she has one son who lives in the area, however does not live with her. She reports moving multiple times over the last year and has been unable to establish proprer transportation and medical care services. She states that she has not taken her medications in several months due to inability to pick them up or go to appointments. She reports that she needs assistance with ambulation for short distances to the bathroom. She states that she gets her food from the caretaker and that she eats a "decent amount". She denies SOB, palpitations, LE swelling, orthopnea, PND, dizziness, presyncope or syncopal episodes. She states that she has been using tobacco, approximately one to two cigarettes per day however denies alcohol or illicit drug use.   In the ED, EKG revealed NSR with  heart rate of 70 and PACs with no acute ischemic ST-T wave abnormalities.  I-STAT troponin found to be negative at 0.00.  Creatinine stable at 0.66.  CXR/Chest CT revealed new bandlike a patient in the right middle lobe, consistent with pneumonia. 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  Cardiology has been asked to evaluate the patient given her persistent c/o of chest pain and bradycardia.   Past Medical History:  Diagnosis Date  . Blindness   . Bradycardia    a. pt reports hx of slow HR.  Marland Kitchen CAD in native artery    a. pt reports "blockage in the back of her heart" sometime in 2018 by cath at [facility].  . Carotid artery disease (HCC)    a. pt reports "blockage scraped" R neck artery around 2000.  . Cataract   . Former tobacco use   . Heart murmur   . Hyperlipidemia   . PAD (peripheral artery disease) (HCC)    a. she was told she had poor circulation from the waist down.  . Skipped heart beats   . Stroke [facility])    a. 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    Past Surgical History:  Procedure Laterality Date  . LEFT HEART CATH AND CORONARY ANGIOGRAPHY N/A 04/04/2018   Procedure: LEFT HEART CATH AND CORONARY ANGIOGRAPHY;  Surgeon: Lyn Records, MD;  Location: [facility] INVASIVE CV LAB;  Service: Cardiovascular;  Laterality: N/A;  . TUBAL LIGATION       Prior to Admission medications   Medication Sig Start Date End Date Taking? Authorizing Provider  atorvastatin (LIPITOR) 80 MG tablet Take 1 tablet (80 mg total) by mouth daily at 6 PM. Patient not taking: Reported on 08/30/2018 04/06/18   Laverna Peace, MD  benzonatate (TESSALON) 100 MG capsule Take 1 capsule (100 mg total) by mouth every 8 (eight) hours. Patient not taking: Reported on 08/30/2018 06/05/18   Terrilee Files, MD  gabapentin (NEURONTIN) 300 MG capsule Take 1 capsule (300 mg total) by mouth 3 (three) times daily for 7 days. Patient not taking: Reported on 04/01/2018 03/23/18 03/30/18  Cristina Gong, PA-C  isosorbide mononitrate  (IMDUR) 30 MG 24 hr tablet Take 1 tablet (30 mg total) by mouth daily. Patient not taking: Reported on 08/30/2018 04/07/18   Laverna Peace, MD  methocarbamol (ROBAXIN) 500 MG tablet Take 1 tablet (500 mg total) by mouth at bedtime and may repeat dose one time if needed. Patient not taking: Reported on 06/04/2018 04/22/18   Bethel Born, PA-C  Misc. Devices (BATH/SHOWER SEAT) MISC Shower seat 04/06/18   Roberto Scales D, MD  naproxen sodium (ALEVE) 220 MG tablet Take 440 mg by mouth 2 (two) times daily as needed (pain).    [provider]  oxyCODONE-acetaminophen (PERCOCET/ROXICET) 5-325 MG tablet Take 1 tablet by mouth every 4 (four) hours as needed for severe pain. Patient not taking: Reported on 06/04/2018 04/22/18   Bethel Born, PA-C    Inpatient Medications: Scheduled Meds: . aspirin EC  81 mg Oral Daily  . atorvastatin  80 mg Oral q1800  . enoxaparin (LOVENOX) injection  20 mg Subcutaneous Q24H  . [START ON 09/01/2018] Influenza vac split quadrivalent PF  0.5  mL Intramuscular Tomorrow-1000  . isosorbide mononitrate  30 mg Oral Daily  . levofloxacin  750 mg Oral Q24H   Continuous Infusions:  PRN Meds: acetaminophen  Allergies:    Allergies  Allergen Reactions  . Penicillins Anaphylaxis    Has patient had a PCN reaction causing immediate rash, facial/tongue/throat swelling, SOB or lightheadedness with hypotension: Yes Has patient had a PCN reaction causing severe rash involving mucus membranes or skin necrosis: No Has patient had a PCN reaction that required hospitalization: Yes Has patient had a PCN reaction occurring within the last 10 years: No If all of the above answers are "NO", then may proceed with Cephalosporin use.     Social History:   Social History   Socioeconomic History  . Marital status: Divorced    Spouse name: Not on file  . Number of children: Not on file  . Years of education: Not on file  . Highest education level: Not on file    Occupational History  . Not on file  Social Needs  . Financial resource strain: Not on file  . Food insecurity:    Worry: Not on file    Inability: Not on file  . Transportation needs:    Medical: Not on file    Non-medical: Not on file  Tobacco Use  . Smoking status: Current Every Day Smoker    Packs/day: 0.50    Years: 10.00    Pack years: 5.00    Types: Cigarettes  . Smokeless tobacco: Never Used  . Tobacco comment: Pt reports she recently quit after 45 years but family says she still smokes  Substance and Sexual Activity  . Alcohol use: Not Currently  . Drug use: Not Currently  . Sexual activity: Not Currently  Lifestyle  . Physical activity:    Days per week: Not on file    Minutes per session: Not on file  . Stress: Not on file  Relationships  . Social connections:    Talks on phone: Not on file    Gets together: Not on file    Attends religious service: Not on file    Active member of club or organization: Not on file    Attends meetings of clubs or organizations: Not on file    Relationship status: Not on file  . Intimate partner violence:    Fear of current or ex partner: Not on file    Emotionally abused: Not on file    Physically abused: Not on file    Forced sexual activity: Not on file  Other Topics Concern  . Not on file  Social History Narrative  . Not on file    Family History:   Family History  Problem Relation Age of Onset  . Hypertension Mother   . Heart disease Father        details unclear   Family Status:  Family Status  Relation Name Status  . Mother  (Not Specified)  . Father  (Not Specified)    ROS:  Please see the history of present illness.  All other ROS reviewed and negative.     Physical Exam/Data:   Vitals:   08/31/18 1115 08/31/18 1130 08/31/18 1145 08/31/18 1237  BP: 124/74 125/67 135/62 (!) 154/63  Pulse: (!) 36 (!) 43  (!) 59  Resp: 20 (!) 22 19   Temp:    98.2 F (36.8 C)  TempSrc:    Oral  SpO2: 97% 99%   98%  Weight:  33 kg  Height:    5\' 2"  (1.575 m)    Intake/Output Summary (Last 24 hours) at 08/31/2018 1408 Last data filed at 08/31/2018 0117 Gross per 24 hour  Intake 1738.75 ml  Output -  Net 1738.75 ml   Filed Weights   08/31/18 1237  Weight: 33 kg   Body mass index is 13.31 kg/m.   General: Frail, cachetic, older than stated age, NAD Skin: Warm, dry, intact  Head: Normocephalic, atraumatic, clear, moist mucus membranes. Neck: Negative for carotid bruits. No JVD Lungs:Clear to ausculation bilaterally. No wheezes, rales, or rhonchi. Breathing is unlabored. Cardiovascular: RRR with S1 S2. No murmurs, rubs, gallops, or LV heave appreciated. Abdomen: Soft, non-tender, non-distended with normoactive bowel sounds. No obvious abdominal masses. MSK: Strength and tone appear normal for age. 5/5 in all extremities Extremities: No edema. No clubbing or cyanosis. DP/PT pulses 2+ bilaterally Neuro: Alert and oriented. No focal deficits. No facial asymmetry. MAE spontaneously. Psych: Responds to questions appropriately with normal affect.     EKG:  The EKG was personally reviewed and demonstrates:  08/31/18 NSR with PAC's HR 70 Telemetry:  Telemetry was personally reviewed and demonstrates: 08/31/18 NSR with frequent PAC's   Relevant CV Studies:  ECHO: 04/02/18: Study Conclusions  - Left ventricle: The cavity size was normal. Systolic function was normal. The estimated ejection fraction was in the range of 60% to 65%. Wall motion was normal; there were no regional wall motion abnormalities. Doppler parameters are consistent with abnormal left ventricular relaxation (grade 1 diastolic dysfunction). There was no evidence of elevated ventricular filling pressure by Doppler parameters. - Mitral valve: Calcified annulus. Mildly thickened leaflets . There was trivial regurgitation. - Left atrium: The atrium was normal in size. - Right ventricle: The cavity size was  normal. Wall thickness was normal. Systolic function was normal. - Right atrium: The atrium was normal in size. - Tricuspid valve: There was mild regurgitation. - Pulmonary arteries: Systolic pressure was within the normal range. - Inferior vena cava: The vessel was normal in size. - Pericardium, extracardiac: There was no pericardial effusion.   CATH: 04/04/18: Cath aborted due to inability to gain vascular access  Cardiac catheterization 04/04/18:   67 year old female presenting with chest pain, negative cardiac markers, and EKG without significant ischemic abnormality.  History of diffuse vascular disease including PAD, CVA, and previously failed cardiac catheterization at [facility] due to absence of access sites.  Using real-time vascular ultrasound and fluoroscopy both femoral arteries were imaged. Cine fluoroscopy demonstrated heavy common femoral calcification extending into the iliacs bilaterally. Using ultrasound guidance right common iliac was punctured using a Seldinger needle. Marked reduction in flow was noted. Multiple wires including a Glidewire were attempted to traverse the iliofemoral but was unsuccessful.  No palpable right radial pulse is present. She has contracture in the right arm due to prior stroke. A faint left radial pulse was palpated. Real-time vascular ultrasound demonstrated very small ulnar and radial arteries with atherosclerosis. Targets were too small to hit despite using ultra sound guidance. The brachial arteries are not palpable.  The procedure was terminated due to absence of access sites.  Further management per treating team.  Laboratory Data:  Chemistry Recent Labs  Lab 08/30/18 1702 08/31/18 0052 08/31/18 0408  NA 143 141 141  K 2.6* 4.0 3.6  CL 108 109 108  CO2 19* 22 22  GLUCOSE 93 90 91  BUN 6* 6* 5*  CREATININE 0.80 0.66 0.64  CALCIUM 8.9 8.1* 8.2*  GFRNONAA >60 >60 >60  GFRAA >60 >60 >60  ANIONGAP 16* 10 11      Total Protein  Date Value Ref Range Status  08/30/2018 7.5 6.5 - 8.1 g/dL Final   Albumin  Date Value Ref Range Status  08/30/2018 3.5 3.5 - 5.0 g/dL Final   AST  Date Value Ref Range Status  08/30/2018 20 15 - 41 U/L Final   ALT  Date Value Ref Range Status  08/30/2018 10 0 - 44 U/L Final   Alkaline Phosphatase  Date Value Ref Range Status  08/30/2018 99 38 - 126 U/L Final   Total Bilirubin  Date Value Ref Range Status  08/30/2018 1.1 0.3 - 1.2 mg/dL Final   Hematology Recent Labs  Lab 08/30/18 1702 08/31/18 0408  WBC 10.4 10.1  RBC 5.79* 4.09  HGB 18.5* 13.0  HCT 55.4* 40.5  MCV 95.7 99.0  MCH 32.0 31.8  MCHC 33.4 32.1  RDW 13.6 13.8  PLT 124* 235   Cardiac EnzymesNo results for input(s): TROPONINI in the last 168 hours.  Recent Labs  Lab 08/30/18 1658 08/30/18 2313  TROPIPOC 0.00 0.00    BNPNo results for input(s): BNP, PROBNP in the last 168 hours.  DDimer No results for input(s): DDIMER in the last 168 hours. TSH:  Lab Results  Component Value Date   TSH 1.754 04/02/2018   Lipids: Lab Results  Component Value Date   CHOL 227 (H) 04/02/2018   HDL 41 04/02/2018   LDLCALC 173 (H) 04/02/2018   TRIG 65 04/02/2018   CHOLHDL 5.5 04/02/2018   HgbA1c:No results found for: HGBA1C  Radiology/Studies:  Dg Chest 2 View  Result Date: 08/30/2018 CLINICAL DATA:  Chest pain EXAM: CHEST - 2 VIEW COMPARISON:  06/04/2018 chest radiograph. FINDINGS: Stable cardiomediastinal silhouette with normal heart size. No pneumothorax. No pleural effusion. Hyperinflated lungs. No pulmonary edema. There is new bandlike opacity in the right middle lobe. Otherwise no consolidative airspace disease. IMPRESSION: 1. New bandlike opacity in the right middle lobe, which could represent atelectasis and/or pneumonia. Recommend follow-up PA and lateral post treatment chest radiographs in 4-6 weeks. 2. Hyperinflated lungs, suggesting COPD. Electronically Signed   By: Delbert Phenix  M.D.   On: 08/30/2018 19:02   Ct Angio Chest Pe W/cm &/or Wo Cm  Result Date: 08/30/2018 CLINICAL DATA:  Abnormal chest radiograph. Dyspnea. Current smoker. Chest pain. Abdominal pain. Diarrhea. EXAM: CT ANGIOGRAPHY CHEST CT ABDOMEN AND PELVIS WITH CONTRAST TECHNIQUE: Multidetector CT imaging of the chest was performed using the standard protocol during bolus administration of intravenous contrast. Multiplanar CT image reconstructions and MIPs were obtained to evaluate the vascular anatomy. Multidetector CT imaging of the abdomen and pelvis was performed using the standard protocol during bolus administration of intravenous contrast. CONTRAST:  ISOVUE-370 IOPAMIDOL (ISOVUE-370) INJECTION 76% COMPARISON:  Chest radiograph from earlier today. 04/01/2018 chest CT angiogram. FINDINGS: CTA CHEST FINDINGS Cardiovascular: The study is high quality for the evaluation of pulmonary embolism. There are no filling defects in the central, lobar, segmental or subsegmental pulmonary artery branches to suggest acute pulmonary embolism. Atherosclerotic nonaneurysmal thoracic aorta. Normal caliber pulmonary arteries. Normal heart size. No significant pericardial fluid/thickening. Three-vessel coronary atherosclerosis. Mediastinum/Nodes: No discrete thyroid nodules. Unremarkable esophagus. No pathologically enlarged axillary, mediastinal or hilar lymph nodes. Lungs/Pleura: No pneumothorax. No pleural effusion. Mild centrilobular emphysema with diffuse bronchial wall thickening. No lung masses or significant pulmonary nodules. New patchy consolidation with some volume loss throughout the dependent right middle lobe. New patchy tree-in-bud  opacity in the basilar right lower lobe. Musculoskeletal: No aggressive appearing focal osseous lesions. Healed deformity in the posterior left tenth rib. Stable chronic moderate T3 vertebral compression fracture. Mild superior T7 vertebral compression fracture is new since 04/01/2018.  Moderate T11 vertebral compression fracture is stable. Review of the MIP images confirms the above findings. CT ABDOMEN and PELVIS FINDINGS Hepatobiliary: Normal liver size with Riedel's lobe configuration of the right liver lobe. No liver mass. Normal gallbladder with no radiopaque cholelithiasis. No biliary ductal dilatation. Pancreas: Normal, with no mass or duct dilation. Spleen: Normal size. No mass. Adrenals/Urinary Tract: Normal adrenals. Normal kidneys with no hydronephrosis and no renal mass. Normal nondistended bladder. Stomach/Bowel: Normal non-distended stomach. Normal caliber small bowel with no small bowel wall thickening. Appendix not discretely visualized. No pericecal inflammatory changes. Normal large bowel with no diverticulosis, large bowel wall thickening or pericolonic fat stranding. Vascular/Lymphatic: Atherosclerotic nonaneurysmal abdominal aorta. 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 Bilateral common iliac arteries appear occluded. Collateral arteries are seen reconstituting the proximal femoral arteries. No pathologically enlarged lymph nodes in the abdomen or pelvis. Reproductive: Grossly normal uterus.  No adnexal mass. Other: No pneumoperitoneum, ascites or focal fluid collection. Musculoskeletal: No aggressive appearing focal osseous lesions. Moderate to severe L1 vertebral compression fracture is new since 04/01/2018 CT. Mild lumbar spondylosis. Review of the MIP images confirms the above findings. IMPRESSION: 1. New patchy consolidation with some volume loss throughout the dependent right middle lobe with new patchy tree-in-bud opacity in the right lower lobe, compatible with multilobar pneumonia. 2. No pulmonary embolism. 3. Mild superior T7 vertebral compression fracture and moderate to severe L1 vertebral compression fracture, both new since 04/01/2018 CT, possibly acute or subacute. 4. 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 5. Three-vessel coronary atherosclerosis. 6. Aortic Atherosclerosis (ICD10-I70.0) and Emphysema (ICD10-J43.9). Electronically Signed   By: Delbert Phenix M.D.   On: 08/30/2018 21:43   Ct Abdomen Pelvis W Contrast  Result Date: 08/30/2018 CLINICAL DATA:  Abnormal chest radiograph. Dyspnea. Current smoker. Chest pain. Abdominal pain. Diarrhea. EXAM: CT ANGIOGRAPHY CHEST CT ABDOMEN AND PELVIS WITH CONTRAST TECHNIQUE: Multidetector CT imaging of the chest was performed using the standard protocol during bolus administration of intravenous contrast. Multiplanar CT image reconstructions and MIPs were obtained to evaluate the vascular anatomy. Multidetector CT imaging of the abdomen and pelvis was performed using the standard protocol during bolus administration of intravenous contrast. CONTRAST:  ISOVUE-370 IOPAMIDOL (ISOVUE-370) INJECTION 76% COMPARISON:  Chest radiograph from earlier today. 04/01/2018 chest CT angiogram. FINDINGS: CTA CHEST FINDINGS Cardiovascular: The study is high quality for the evaluation of pulmonary embolism. There are no filling defects in the central, lobar, segmental or subsegmental pulmonary artery branches to suggest acute pulmonary embolism. Atherosclerotic nonaneurysmal thoracic aorta. Normal caliber pulmonary arteries. Normal heart size. No significant pericardial fluid/thickening. Three-vessel coronary atherosclerosis. Mediastinum/Nodes: No discrete thyroid nodules. Unremarkable esophagus. No pathologically enlarged axillary, mediastinal or hilar lymph nodes. Lungs/Pleura: No pneumothorax. No pleural effusion. Mild centrilobular emphysema with diffuse bronchial wall thickening. No lung masses or significant pulmonary nodules. New patchy consolidation with some volume loss throughout the dependent right middle lobe. New patchy tree-in-bud opacity in the basilar right lower lobe.  Musculoskeletal: No aggressive appearing focal osseous lesions. Healed deformity in the posterior left tenth rib. Stable chronic moderate T3 vertebral compression fracture. Mild superior T7 vertebral compression fracture is new since 04/01/2018. Moderate  T11 vertebral compression fracture is stable. Review of the MIP images confirms the above findings. CT ABDOMEN and PELVIS FINDINGS Hepatobiliary: Normal liver size with Riedel's lobe configuration of the right liver lobe. No liver mass. Normal gallbladder with no radiopaque cholelithiasis. No biliary ductal dilatation. Pancreas: Normal, with no mass or duct dilation. Spleen: Normal size. No mass. Adrenals/Urinary Tract: Normal adrenals. Normal kidneys with no hydronephrosis and no renal mass. Normal nondistended bladder. Stomach/Bowel: Normal non-distended stomach. Normal caliber small bowel with no small bowel wall thickening. Appendix not discretely visualized. No pericecal inflammatory changes. Normal large bowel with no diverticulosis, large bowel wall thickening or pericolonic fat stranding. Vascular/Lymphatic: Atherosclerotic nonaneurysmal abdominal aorta. 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 Bilateral common iliac arteries appear occluded. Collateral arteries are seen reconstituting the proximal femoral arteries. No pathologically enlarged lymph nodes in the abdomen or pelvis. Reproductive: Grossly normal uterus.  No adnexal mass. Other: No pneumoperitoneum, ascites or focal fluid collection. Musculoskeletal: No aggressive appearing focal osseous lesions. Moderate to severe L1 vertebral compression fracture is new since 04/01/2018 CT. Mild lumbar spondylosis. Review of the MIP images confirms the above findings. IMPRESSION: 1. New patchy consolidation with some volume loss throughout the dependent right middle lobe with new patchy tree-in-bud opacity in the right lower lobe, compatible with multilobar pneumonia. 2. No  pulmonary embolism. 3. Mild superior T7 vertebral compression fracture and moderate to severe L1 vertebral compression fracture, both new since 04/01/2018 CT, possibly acute or subacute. 4. 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 5. Three-vessel coronary atherosclerosis. 6. Aortic Atherosclerosis (ICD10-I70.0) and Emphysema (ICD10-J43.9). Electronically Signed   By: Delbert Phenix M.D.   On: 08/30/2018 21:43   Assessment and Plan:   1. Recurrent chest pain with negative troponin and known issues with vascular access during prior heart catheterizations: -Patient with multiple attempts at [facility] for evaluation of chest pain. Unfortunately last attempt 03/2018 aborted secondary to inability to gain vascular access. Decision was made at that time to pursue medical management of angina with low-dose isosorbide mononitrate.  Per Dr. Antoine Poche, no further attempts at cardiac catheterization should be made secondary to above. She reports that she has been unable to obtain her medications and has not taken any in several months time. See full social description above.  -We will plan to restart home medications and have case management and social work get involved to assist with OP needs.  -Continue Imdur, ASA and atorvastatin -No beta-blocker secondary to history of bradycardia with intermittent rates in the 30 -If she continues to have chest pain after restarting her medications, consider adding amlodipine 5mg  and titrate as tolerated and/or  Ranexa for greater anginal control   2. Bradycardia: -Pt noted to have HR's in the 30-60's without symptoms  -BP stable and not on AV blocking agents   3. CAP: -Evidence of pneumonia seen on chest CT imaging -WBC, 10.1 -Empiric antibiotics started per primary team with Levaquin   4.  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 Bilateral common iliac arteries appear occluded and collateral arteries are seen reconstituting the proximal femoral arteries  5.  Dyslipidemia: -LDL, 173 last admission with goal of <70 -Patient recently started on high intensity atorvastatin 80 mg daily last hospital admission -No follow-up completed for repeat lipid profile or LFTs completed  after d/c -AST, 20; ALT, 10>>stable   6. Social concerns/weight loss/failure to thrive: -Case management consulted for medication/social issues and states that the patient has Medicare Part D. They are working to discharge her to a SNF for post hospital care -Weight at last hospitalization, 93lb>>>72.2lb this admission with reports of adequate intake per patient  -Per chart review, consider palliative care consult for goals of care establishment.     For questions or updates, please contact [facility] Please consult [email or web address] for contact info under Cardiology/STEMI.   SignedGeorgie Chard NP-C [facility] Pager: [phone] 08/31/2018 2:08 PM  Personally seen and examined. Agree with above.  67 year old with severe peripheral vascular disease, unable to perform cardiac catheterization after 2 attempts with presumed coronary disease, multiple strokes, hyperlipidemia, carotid artery disease here with chest pain, bradycardia.  She is currently laying in bed, comfortable, no chest pain, no shortness of breath she states that she is depressed.  On exam she is thin, cachectic, alert, moves all extremities, regular rate and rhythm without any appreciable murmurs rubs or gallops or JVD, lungs are clear, abdomen soft.  Enteric precautions in room.  Lab work personally reviewed-creatinine 0.64, LDL 173  Assessment and plan:  Angina - Reassuring troponin. -Not a candidate for cardiac catheterization due to lack of vascular access - Agree with restarting  isosorbide, aspirin, atorvastatin.  Avoiding beta-blockers because of transient bradycardia.  No need for pacemaker. -If necessary add amlodipine for antianginal support.  Hyperlipidemia - Restarting atorvastatin, last LDL 173.  Hopefully this will help stabilize plaque.  Ultimately, appreciate social work's input and persistence and helping her.  No other cardiac recommendations.  [facility] will sign off.   Medication Recommendations:  As above Other recommendations (labs, testing, etc):  none Follow up as an outpatient:  As previously scheduled, Dr. Rennis Golden has seen in the past.  Donato Schultz, MD

## 2018-08-31 NOTE — Progress Notes (Signed)
Was paged via [facility] about patients abdominal pain. Due to potentially infectious nature of the diarrhea, cannot offer antidiarrheal medication at this time. Can offer heating pad and tylenol for abdominal pain/cramping.  I was also informed that the C. Diff test wasn't able to be performed because the stool content was too firm and therefore an incorrect consistency. Patient is now having less formed stools and recommend recollecting sample for C diff testing.  For future pages, please use [phone].  H. [name], DO 4:13am, 08-31-2018

## 2018-08-31 NOTE — Evaluation (Signed)
Physical Therapy Evaluation Patient Details Name: Mary Nguyen MRN: [id number] DOB: 08/03/1951 Today's Date: 08/31/2018   History of Present Illness  Pt is a 67 y/o female admitted secondary to chest pain. Imaging revealed likely R middle and lower lobe PNA, and showed new T7-L1 compression fxs. Imaging negative for PE. PMH includes blindness, CVA, PAD, CAD, and current smoker.   Clinical Impression  Pt admitted secondary to problem above with deficits below. Pt unsteady, and presenting with weakness and pain in her back. Also reporting not feeling well secondary to diarrhea. Pt requiring min to mod A for mobility this session and only able to side step along EOB. Feel pt is at increased risk for falls and would benefit from post acute SNF at d/c. Will continue to follow acutely to maximize functional mobility independence and safety.     Follow Up Recommendations SNF;Supervision/Assistance - 24 hour    Equipment Recommendations  Rolling walker with 5" wheels    Recommendations for Other Services       Precautions / Restrictions Precautions Precautions: Fall;Back Precaution Booklet Issued: No Precaution Comments: Reviewed back precautions with pt Restrictions Weight Bearing Restrictions: No      Mobility  Bed Mobility Overal bed mobility: Needs Assistance Bed Mobility: Sidelying to Sit;Rolling;Sit to Sidelying Rolling: Mod assist Sidelying to sit: Mod assist     Sit to sidelying: Mod assist General bed mobility comments: Mod A for assist with rolling, trunk elevation and LE management to sit at EOB. Verbal cues for use of log roll technique. Required mod A for return to sidelying for LE assist and assist with trunk descent.   Transfers Overall transfer level: Needs assistance Equipment used: 1 person hand held assist Transfers: Sit to/from Stand Sit to Stand: Mod assist         General transfer comment: Mod A for lift assist and steadying. PT stood in front of pt  and pt held on to PT's arms to stand.   Ambulation/Gait Ambulation/Gait assistance: Min assist   Assistive device: 1 person hand held assist       General Gait Details: Assisted pt with side stepping at EOB and pt unsteady requiring min A. Noted slight R knee buckling during stance phase. Pt not feeling well, so requesting to defer further mobility.   Stairs            Wheelchair Mobility    Modified Rankin (Stroke Patients Only)       Balance Overall balance assessment: Needs assistance Sitting-balance support: Bilateral upper extremity supported;Feet unsupported Sitting balance-Leahy Scale: Poor Sitting balance - Comments: Reliant on min to min guard A for balance.    Standing balance support: Bilateral upper extremity supported Standing balance-Leahy Scale: Poor Standing balance comment: Reliant on BUE support and external support for balance.                              Pertinent Vitals/Pain Pain Assessment: Faces Faces Pain Scale: Hurts little more Pain Location: back  Pain Descriptors / Indicators: Guarding;Grimacing Pain Intervention(s): Limited activity within patient's tolerance;Monitored during session;Repositioned    Home Living Family/patient expects to be discharged to:: Other (hotel) Living Arrangements: Non-relatives/Friends Available Help at Discharge: Friend(s);Available PRN/intermittently Type of Home: Other(Comment)(hotel ) Home Access: Stairs to enter Entrance Stairs-Rails: None Entrance Stairs-Number of Steps: 1(threshold ) Home Layout: One level Home Equipment: None      Prior Function Level of Independence: Needs assistance   Gait /  Transfers Assistance Needed: Reports she does not use AD for ambulation   ADL's / Homemaking Assistance Needed: assist for sponge bath per previous notes         Hand Dominance        Extremity/Trunk Assessment   Upper Extremity Assessment Upper Extremity Assessment: Generalized  weakness    Lower Extremity Assessment Lower Extremity Assessment: Generalized weakness;RLE deficits/detail;LLE deficits/detail( ) RLE Deficits / Details: RLE weakness at baseline secondary to CVA RLE Sensation: history of peripheral neuropathy LLE Sensation: history of peripheral neuropathy    Cervical / Trunk Assessment Cervical / Trunk Assessment: Other exceptions Cervical / Trunk Exceptions: New T7, L1 compression fx.   Communication   Communication: No difficulties  Cognition Arousal/Alertness: Awake/alert Behavior During Therapy: WFL for tasks assessed/performed Overall Cognitive Status: No family/caregiver present to determine baseline cognitive functioning                                        General Comments General comments (skin integrity, edema, etc.): No family present during session.     Exercises     Assessment/Plan    PT Assessment Patient needs continued PT services  PT Problem List Decreased strength;Decreased activity tolerance;Decreased balance;Decreased mobility;Decreased knowledge of use of DME;Decreased knowledge of precautions;Pain       PT Treatment Interventions DME instruction;Gait training;Functional mobility training;Therapeutic activities;Therapeutic exercise;Balance training;Patient/family education    PT Goals (Current goals can be found in the Care Plan section)  Acute Rehab PT Goals Patient Stated Goal: to feel better  PT Goal Formulation: With patient Time For Goal Achievement: 09/14/18 Potential to Achieve Goals: Fair    Frequency Min 2X/week   Barriers to discharge Other (comment) Unsure of caregiver support at home     Co-evaluation               AM-PAC PT "6 Clicks" Daily Activity  Outcome Measure Difficulty turning over in bed (including adjusting bedclothes, sheets and blankets)?: Unable Difficulty moving from lying on back to sitting on the side of the bed? : Unable Difficulty sitting down on and  standing up from a chair with arms (e.g., wheelchair, bedside commode, etc,.)?: Unable Help needed moving to and from a bed to chair (including a wheelchair)?: A Lot Help needed walking in hospital room?: A Lot Help needed climbing 3-5 steps with a railing? : Total 6 Click Score: 8    End of Session   Activity Tolerance: Patient limited by fatigue Patient left: in bed;with call bell/phone within reach Nurse Communication: Mobility status PT Visit Diagnosis: Unsteadiness on feet (R26.81);Muscle weakness (generalized) (M62.81);History of falling (Z91.81);Pain Pain - part of body: (back )    Time: 4098-1191 PT Time Calculation (min) (ACUTE ONLY): 13 min   Charges:   PT Evaluation $PT Eval Low Complexity: 1 Low          Gladys Damme, PT, DPT  Acute Rehabilitation Services  Pager: [phone] Office: [phone]   Lehman Prom 08/31/2018, 2:18 PM

## 2018-08-31 NOTE — Care Management Note (Addendum)
Case Management Note  Patient Details  Name: Mary Nguyen MRN: [id number] Date of Birth: 1951/02/24  Subjective/Objective:  [facility], presents with chest pain and pna, bradycardia.   Per pt eval rec SNF, CSW aware.  Patient does have Medicare part D coverage for medications under [employer] per Artist.                      Action/Plan: NCM will follow for transition of care needs.   Expected Discharge Date:                  Expected Discharge Plan:  Skilled Nursing Facility  In-House Referral:  Clinical Social Work  Discharge planning Services  CM Consult  Post Acute Care Choice:    Choice offered to:     DME Arranged:    DME Agency:     HH Arranged:    HH Agency:     Status of Service:  In process, will continue to follow  If discussed at Long Length of Stay Meetings, dates discussed:    Additional Comments:  Leone Haven, RN 08/31/2018, 2:34 PM

## 2018-08-31 NOTE — Progress Notes (Signed)
Family Medicine Teaching Service Daily Progress Note Intern Pager: [phone]  Patient name: Mary Nguyen Medical record number: [id number] Date of birth: [birth year] Age: 67 y.o. Gender: female  Primary Care Provider: Patient, No Pcp Per Consultants: cardiology, nutrition, PT/OT, palliative, social work, case mgmt Code Status: Full code  Pt Overview and Major Events to Date:  Hospital Day 0 Admitted: 08/30/2018   Assessment and Plan: Tanai Bouler is a 67 y.o. female presenting with chest pain and pneumonia. PMH is significant for unstable angina, CVA x 4, hyperlipidemia, blindness, PAD, CAD, bradycardia, and current smoker.  #Pneumonia - 2 wk or 3-4 d hx of dry cough.  CXR and Ct indicates multilobar pna. No elevated white count or fever.    - levaquin 750mg  x 5d.  - monitor for worsening  #chest pain w/ bradycardia - started Tuesday.  Has been off and on for a few years. Feels better now. hx of unstable angina.  EKG this am showed bradycardia below 40 bpm. Were unable to perform cath on 04/2018 due to PAD. Troponin negative. CXR shows possible right middle lobar pna, hyperinflated lungs. No wbc, no fever. -  Cardiology consult for bradycardia w/ chest pain.  - levaquin 750mg  for PNA x 5d.  - f/u legionella and S. pneuomniae labs -continuous pulse ox -PT/OT -am BMP, CBC  #vertebral fractures - T7, L1 fracture.  New since April. Patient does not remember any traumatic precipitating event.  - vit. D and calcium - start bisphosphonate as outpatient.    -dysuria - new onset from early this am when after purewick catheter placed and had BM.  Urinalysis showed -ketonuria 20, trace leukocytes, no nitrites.   # malnutrition/cachexia - patient is frail.  BMI is 13.31.  Patient has been living with caregiver in extended stay motel.   - nutrition consult - social work consult -palliative consult.   #diarrhea w/ abdominal pain - new onset diarrhea for past week, per patient. Is  frequently mentioned in ED notes as far back as April.  Is having mild abdominal pain with tenderness to palpation in RLQ and LLQ.  No wbc count. Most likely not infectious, chronic diarrhea.  - enteric precautions -f/u c.diff - f/u GI panel  #social concerns - removed from SNF in [place] and lives with family with APS involvement. No longer taking meds prescribed at SNF. Weight loss and decreased appetite. Lives in [place] w/ caregiver jasmine. Has been staying at hotel since at least April.  Used to stay with son, nicholas.  - social work consult  -. 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    #neuropathy - secondary to PAD. Has not taken gabapentin since April 2019.  - monitor.  #thrombocytopenia - resolved.  platelets 124->235 - monitor CBC  #hypokalemia - resolved  K 2.6. S/p Kdur and IV potassium x3. Currently 3.6 this am.   #polycythemia - resolved. 18.5 hgb. Baseline 13-15.  Resolved.  Back down to 13.0 -am cbc  Fluids: none  Electrolytes: replete K  Nutrition: heart modified GI ppx: none DVT px: lovenox 20.   Disposition: undetermined at this point   Medications: Scheduled Meds: . enoxaparin (LOVENOX) injection  20 mg Subcutaneous Q24H  . iopamidol      . levofloxacin  750 mg Oral Q24H   Continuous Infusions: PRN Meds: acetaminophen  ================================================= ================================================= Subjective:  Patient states her chest pain is better. The chest pain has been 'off and on'  for several years.  She has had a nonproductive cough for 3 to 4 days. She has had diarrhea for a week now.  She has had back pain for about 6 weeks.  She does not remember any traumatic event.  She states she is having dysuria after having a BM with a purewick catheter in place.    Objective: Temp:  [97.8 F (36.6 C)] 97.8 F (36.6 C) (09/24 1704) Pulse Rate:   [31-118] 77 (09/25 0347) Resp:  [15-29] 22 (09/25 0347) BP: (121-169)/(57-101) 146/63 (09/25 0346) SpO2:  [88 %-100 %] 96 % (09/25 0347) Intake/Output 09/24 0701 - 09/25 0700 In: 1738.8 [I.V.:695; IV Piggyback:1043.8] Out: -  Physical Exam:  Gen: NAD, appears older than her stated age.  HEENT: Normocephaic, atraumatic. Clear conjuctiva, no scleral icterus and injection.  CV: bradycardic  Normal S1-S2.  Marland Kitchen No bilateral lower extremity edema. Resp: diminished breath sounds.  No increased work of breathing appreciated. Abd: LLQ, LRQ TTP.  Positive bowel sounds. Psych: Cooperative with exam. Pleasant.  Extremities: lower extremities cold to touch.    Laboratory: Recent Labs  Lab 08/30/18 1702 08/31/18 0408  WBC 10.4 10.1  HGB 18.5* 13.0  HCT 55.4* 40.5  PLT 124* 235   Recent Labs  Lab 08/30/18 1702 08/30/18 2155 08/31/18 0052 08/31/18 0408  NA 143  --  141 141  K 2.6*  --  4.0 3.6  CL 108  --  109 108  CO2 19*  --  22 22  BUN 6*  --  6* 5*  CREATININE 0.80  --  0.66 0.64  CALCIUM 8.9  --  8.1* 8.2*  PROT  --  7.5  --   --   BILITOT  --  1.1  --   --   ALKPHOS  --  99  --   --   ALT  --  10  --   --   AST  --  20  --   --   GLUCOSE 93  --  90 91     Imaging/Diagnostic Tests: Dg Chest 2 View  Result Date: 08/30/2018 CLINICAL DATA:  Chest pain EXAM: CHEST - 2 VIEW COMPARISON:  06/04/2018 chest radiograph. FINDINGS: Stable cardiomediastinal silhouette with normal heart size. No pneumothorax. No pleural effusion. Hyperinflated lungs. No pulmonary edema. There is new bandlike opacity in the right middle lobe. Otherwise no consolidative airspace disease. IMPRESSION: 1. New bandlike opacity in the right middle lobe, which could represent atelectasis and/or pneumonia. Recommend follow-up PA and lateral post treatment chest radiographs in 4-6 weeks. 2. Hyperinflated lungs, suggesting COPD. Electronically Signed   By: Delbert Phenix M.D.   On: 08/30/2018 19:02   Ct Angio Chest Pe  W/cm &/or Wo Cm  Result Date: 08/30/2018 CLINICAL DATA:  Abnormal chest radiograph. Dyspnea. Current smoker. Chest pain. Abdominal pain. Diarrhea. EXAM: CT ANGIOGRAPHY CHEST CT ABDOMEN AND PELVIS WITH CONTRAST TECHNIQUE: Multidetector CT imaging of the chest was performed using the standard protocol during bolus administration of intravenous contrast. Multiplanar CT image reconstructions and MIPs were obtained to evaluate the vascular anatomy. Multidetector CT imaging of the abdomen and pelvis was performed using the standard protocol during bolus administration of intravenous contrast. CONTRAST:  ISOVUE-370 IOPAMIDOL (ISOVUE-370) INJECTION 76% COMPARISON:  Chest radiograph from earlier today. 04/01/2018 chest CT angiogram. FINDINGS: CTA CHEST FINDINGS Cardiovascular: The study is high quality for the evaluation of pulmonary embolism. There are no filling defects in the central, lobar, segmental or subsegmental pulmonary artery branches to suggest  acute pulmonary embolism. Atherosclerotic nonaneurysmal thoracic aorta. Normal caliber pulmonary arteries. Normal heart size. No significant pericardial fluid/thickening. Three-vessel coronary atherosclerosis. Mediastinum/Nodes: No discrete thyroid nodules. Unremarkable esophagus. No pathologically enlarged axillary, mediastinal or hilar lymph nodes. Lungs/Pleura: No pneumothorax. No pleural effusion. Mild centrilobular emphysema with diffuse bronchial wall thickening. No lung masses or significant pulmonary nodules. New patchy consolidation with some volume loss throughout the dependent right middle lobe. New patchy tree-in-bud opacity in the basilar right lower lobe. Musculoskeletal: No aggressive appearing focal osseous lesions. Healed deformity in the posterior left tenth rib. Stable chronic moderate T3 vertebral compression fracture. Mild superior T7 vertebral compression fracture is new since 04/01/2018. Moderate T11 vertebral compression fracture is stable.  Review of the MIP images confirms the above findings. CT ABDOMEN and PELVIS FINDINGS Hepatobiliary: Normal liver size with Riedel's lobe configuration of the right liver lobe. No liver mass. Normal gallbladder with no radiopaque cholelithiasis. No biliary ductal dilatation. Pancreas: Normal, with no mass or duct dilation. Spleen: Normal size. No mass. Adrenals/Urinary Tract: Normal adrenals. Normal kidneys with no hydronephrosis and no renal mass. Normal nondistended bladder. Stomach/Bowel: Normal non-distended stomach. Normal caliber small bowel with no small bowel wall thickening. Appendix not discretely visualized. No pericecal inflammatory changes. Normal large bowel with no diverticulosis, large bowel wall thickening or pericolonic fat stranding. Vascular/Lymphatic: Atherosclerotic nonaneurysmal abdominal aorta. 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 Bilateral common iliac arteries appear occluded. Collateral arteries are seen reconstituting the proximal femoral arteries. No pathologically enlarged lymph nodes in the abdomen or pelvis. Reproductive: Grossly normal uterus.  No adnexal mass. Other: No pneumoperitoneum, ascites or focal fluid collection. Musculoskeletal: No aggressive appearing focal osseous lesions. Moderate to severe L1 vertebral compression fracture is new since 04/01/2018 CT. Mild lumbar spondylosis. Review of the MIP images confirms the above findings. IMPRESSION: 1. New patchy consolidation with some volume loss throughout the dependent right middle lobe with new patchy tree-in-bud opacity in the right lower lobe, compatible with multilobar pneumonia. 2. No pulmonary embolism. 3. Mild superior T7 vertebral compression fracture and moderate to severe L1 vertebral compression fracture, both new since 04/01/2018 CT, possibly acute or subacute. 4. 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 5. Three-vessel coronary atherosclerosis. 6. Aortic Atherosclerosis (ICD10-I70.0) and Emphysema (ICD10-J43.9). Electronically Signed   By: Delbert Phenix M.D.   On: 08/30/2018 21:43   Ct Abdomen Pelvis W Contrast  Result Date: 08/30/2018 CLINICAL DATA:  Abnormal chest radiograph. Dyspnea. Current smoker. Chest pain. Abdominal pain. Diarrhea. EXAM: CT ANGIOGRAPHY CHEST CT ABDOMEN AND PELVIS WITH CONTRAST TECHNIQUE: Multidetector CT imaging of the chest was performed using the standard protocol during bolus administration of intravenous contrast. Multiplanar CT image reconstructions and MIPs were obtained to evaluate the vascular anatomy. Multidetector CT imaging of the abdomen and pelvis was performed using the standard protocol during bolus administration of intravenous contrast. CONTRAST:  ISOVUE-370 IOPAMIDOL (ISOVUE-370) INJECTION 76% COMPARISON:  Chest radiograph from earlier today. 04/01/2018 chest CT angiogram. FINDINGS: CTA CHEST FINDINGS Cardiovascular: The study is high quality for the evaluation of pulmonary embolism. There are no filling defects in the central, lobar, segmental or subsegmental pulmonary artery branches to suggest acute pulmonary embolism. Atherosclerotic nonaneurysmal thoracic aorta. Normal caliber pulmonary arteries. Normal heart size. No significant pericardial fluid/thickening. Three-vessel coronary atherosclerosis. Mediastinum/Nodes: No discrete thyroid nodules. Unremarkable esophagus. No pathologically enlarged axillary, mediastinal or hilar lymph nodes. Lungs/Pleura: No pneumothorax. No  pleural effusion. Mild centrilobular emphysema with diffuse bronchial wall thickening. No lung masses or significant pulmonary nodules. New patchy consolidation with some volume loss throughout the dependent right middle lobe. New patchy tree-in-bud opacity in the basilar right lower lobe. Musculoskeletal: No aggressive appearing focal osseous  lesions. Healed deformity in the posterior left tenth rib. Stable chronic moderate T3 vertebral compression fracture. Mild superior T7 vertebral compression fracture is new since 04/01/2018. Moderate T11 vertebral compression fracture is stable. Review of the MIP images confirms the above findings. CT ABDOMEN and PELVIS FINDINGS Hepatobiliary: Normal liver size with Riedel's lobe configuration of the right liver lobe. No liver mass. Normal gallbladder with no radiopaque cholelithiasis. No biliary ductal dilatation. Pancreas: Normal, with no mass or duct dilation. Spleen: Normal size. No mass. Adrenals/Urinary Tract: Normal adrenals. Normal kidneys with no hydronephrosis and no renal mass. Normal nondistended bladder. Stomach/Bowel: Normal non-distended stomach. Normal caliber small bowel with no small bowel wall thickening. Appendix not discretely visualized. No pericecal inflammatory changes. Normal large bowel with no diverticulosis, large bowel wall thickening or pericolonic fat stranding. Vascular/Lymphatic: Atherosclerotic nonaneurysmal abdominal aorta. 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 Bilateral common iliac arteries appear occluded. Collateral arteries are seen reconstituting the proximal femoral arteries. No pathologically enlarged lymph nodes in the abdomen or pelvis. Reproductive: Grossly normal uterus.  No adnexal mass. Other: No pneumoperitoneum, ascites or focal fluid collection. Musculoskeletal: No aggressive appearing focal osseous lesions. Moderate to severe L1 vertebral compression fracture is new since 04/01/2018 CT. Mild lumbar spondylosis. Review of the MIP images confirms the above findings. IMPRESSION: 1. New patchy consolidation with some volume loss throughout the dependent right middle lobe with new patchy tree-in-bud opacity in the right lower lobe, compatible with multilobar pneumonia. 2. No pulmonary embolism. 3. Mild superior T7 vertebral  compression fracture and moderate to severe L1 vertebral compression fracture, both new since 04/01/2018 CT, possibly acute or subacute. 4. 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 5. Three-vessel coronary atherosclerosis. 6. Aortic Atherosclerosis (ICD10-I70.0) and Emphysema (ICD10-J43.9). Electronically Signed   By: Delbert Phenix M.D.   On: 08/30/2018 21:43      Sandre Kitty, MD 08/31/2018, 6:04 AM PGY-1, [facility] Family Medicine FPTS Intern pager: [phone], text pages welcome

## 2018-08-31 NOTE — ED Notes (Signed)
Report called to [place], RN on 2 Chad - ready to accept pt

## 2018-08-31 NOTE — ED Notes (Addendum)
Pt. Reports abdominal pain. Pt. Requesting something for upset stomach and diarrhea. [name], Intern paged.

## 2018-09-01 DIAGNOSIS — F3289 Other specified depressive episodes: Secondary | ICD-10-CM

## 2018-09-01 DIAGNOSIS — F32A Depression, unspecified: Secondary | ICD-10-CM

## 2018-09-01 DIAGNOSIS — Z7189 Other specified counseling: Secondary | ICD-10-CM

## 2018-09-01 DIAGNOSIS — Z515 Encounter for palliative care: Secondary | ICD-10-CM

## 2018-09-01 DIAGNOSIS — F329 Major depressive disorder, single episode, unspecified: Secondary | ICD-10-CM

## 2018-09-01 DIAGNOSIS — R627 Adult failure to thrive: Secondary | ICD-10-CM

## 2018-09-01 LAB — BASIC METABOLIC PANEL
ANION GAP: 12 (ref 5–15)
BUN: 5 mg/dL — ABNORMAL LOW (ref 8–23)
CHLORIDE: 111 mmol/L (ref 98–111)
CO2: 19 mmol/L — AB (ref 22–32)
CREATININE: 0.66 mg/dL (ref 0.44–1.00)
Calcium: 8.4 mg/dL — ABNORMAL LOW (ref 8.9–10.3)
GFR calc non Af Amer: >60 mL/min (ref >60)
GLUCOSE: 89 mg/dL (ref 70–99)
Potassium: 4.2 mmol/L (ref 3.5–5.1)
Sodium: 142 mmol/L (ref 135–145)

## 2018-09-01 LAB — CBC
HEMATOCRIT: 36.9 % (ref 36.0–46.0)
HEMOGLOBIN: 12.2 g/dL (ref 12.0–15.0)
MCH: 32.1 pg (ref 26.0–34.0)
MCHC: 33.1 g/dL (ref 30.0–36.0)
MCV: 97.1 fL (ref 78.0–100.0)
Platelets: 206 10*3/uL (ref 150–400)
RBC: 3.8 MIL/uL — ABNORMAL LOW (ref 3.87–5.11)
RDW: 13.6 % (ref 11.5–15.5)
WBC: 7.6 10*3/uL (ref 4.0–10.5)

## 2018-09-01 LAB — LEGIONELLA PNEUMOPHILA SEROGP 1 UR AG: L. PNEUMOPHILA SEROGP 1 UR AG: NEGATIVE

## 2018-09-01 MED ORDER — CALCIUM CARBONATE-VITAMIN D 500-200 MG-UNIT PO TABS
1.0000 | ORAL_TABLET | Freq: Every day | ORAL | Status: DC
  Administered 2018-09-01 – 2018-09-07 (×7): 1 via ORAL
  Filled 2018-09-01 (×7): qty 1

## 2018-09-01 NOTE — Progress Notes (Signed)
Initial Nutrition Assessment  DOCUMENTATION CODES:   Severe malnutrition in context of chronic illness, Underweight  INTERVENTION:    Await swallow evaluation/SLP recommendations   Magic cup TID with meals, each supplement provides 290 kcal and 9 grams of protein  NUTRITION DIAGNOSIS:   Severe Malnutrition related to chronic illness(CAD, hx of CVA's, depression) as evidenced by severe fat depletion, severe muscle depletion  GOAL:   Patient will meet greater than or equal to 90% of their needs  MONITOR:   PO intake, Supplement acceptance, Labs, Skin, Weight trends, I & O's  REASON FOR ASSESSMENT:   Consult Assessment of nutrition requirement/status  ASSESSMENT:   [name] who presented with chest pain and pneumonia. PMH is significant for unstable angina, CVA x 4, hyperlipidemia, blindness, PAD, CAD, bradycardia, and current smoker.  RD spoke with patient at bedside. She is blind. Lunch meal was on her tray table. She consumed approximately 50%. She does not live alone (believe with her son) who cooks meals for her.  RD initiated subject of supplements including Ensure; pt stated "I hate that crap". She endorses weight loss however unable to identify amount or time frame. Labs & medications reviewed.  Palliative Medicine Team note reviewed. Prognosis is poor but she is a full code.  Verbal with Read Back order received per Dr. Constance Nguyen for SLP consult.  NUTRITION - FOCUSED PHYSICAL EXAM:    Most Recent Value  Orbital Region  Moderate depletion  Upper Arm Region  Severe depletion  Thoracic and Lumbar Region  Unable to assess  Buccal Region  Moderate depletion  Temple Region  Severe depletion  Clavicle Bone Region  Severe depletion  Clavicle and Acromion Bone Region  Severe depletion  Scapular Bone Region  Unable to assess  Dorsal Hand  Unable to assess  Patellar Region  Severe depletion  Anterior Thigh Region  Severe depletion  Posterior Calf Region   Severe depletion  Edema (RD Assessment)  None    Diet Order:   Diet Order            Diet Heart Room service appropriate? Yes; Fluid consistency: Thin  Diet effective now             EDUCATION NEEDS:   Not appropriate for education at this time  Skin:  Skin Assessment: Reviewed RN Assessment  Last BM:  9/26  Height:   Ht Readings from Last 1 Encounters:  08/31/18 5\' 2"  (1.575 m)   Weight:   Wt Readings from Last 1 Encounters:  08/31/18 33 kg   BMI:  Body mass index is 13.31 kg/m.  Estimated Nutritional Needs:   Kcal:  1100-1300  Protein:  55-70 gm  Fluid:  >/= 1.5 L  Mary Nguyen, RD, LDN Pager #: [phone] After-Hours Pager #: [phone]
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
## 2018-09-01 NOTE — Progress Notes (Signed)
Family Medicine Teaching Service Daily Progress Note Intern Pager: [phone]  Patient name: Mary Nguyen Medical record number: [id number] Date of birth: [birth year] Age: 67 y.o. Gender: female  Primary Care Provider: Patient, No Pcp Per Consultants: cardiology Code Status: Full code  Pt Overview and Major Events to Date:  Hospital Day 1 Admitted: 08/30/2018  Assessment and Plan: Mary Hamiltonis a 67 y.o.femalepresenting with chest pain and pneumonia. PMH is significant forunstable angina, CVAx 4, hyperlipidemia, blindness, PAD, CAD, bradycardia, and current smoker.  #Pneumonia - 2 wk or 3-4 d hx of dry cough.  CXR and Ct indicates multilobar pna. No elevated white count or fever.    - levaquin 500mg  x 5d.  - monitor for worsening  #chest pain w/ bradycardia - chest pain has improved per patient. started Tuesday.  Has been off and on for a few years.  hx of unstable angina.  EKG showed bradycardia below 40 bpm. Were unable to perform cath on 04/2018 due to PAD. Troponin negative. CXR shows possible right middle lobar pna, hyperinflated lungs. No wbc, no fever. Failed cath in April. Patient did not follow up with cardiology.  -  Cardiology consult: imdur, asa, statin, consider amlodipine 5mg  or ranexa for continued chest pain.    - levaquin 750mg  for PNA x 5d.  - f/u [facility]  -continuous pulse ox -PT/OT - PT suggests SNF -am BMP, CBC  #vertebral fractures - T7, L1 fracture.  New since April. Patient does not remember any traumatic precipitating event.  - vit. D and calcium - start bisphosphonate as outpatient.   - SNF  -dysuria - new onset from early this am when after purewick catheter placed and had BM.  Urinalysis showed ketonuria 20, trace leukocytes, no nitrites.  -currently on levaquin  # malnutrition/cachexia - patient is frail.  BMI is 13.31.  Patient has been living with caregiver in extended stay motel.   - placed nutrition consult -placed social work  consult -placed palliative consult.   #diarrhea w/ abdominal pain - new onset diarrhea for past week, per patient. This complaint is mentioned in ED notes as far back as April.  Is having mild abdominal pain with tenderness to palpation in RLQ and LLQ.  No wbc count. 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 Currently on levaquin - enteric precautions - on levaquin -supportive therapy  #social concerns - removed from SNF in [place] and lives with family with APS involvement. No longer taking meds prescribed at SNF. Weight loss and decreased appetite. Lives in [place] w/ caregiver jasmine. Has been staying at hotel since at least April.  Used to stay with son, nicholas. Case management saw patient and confirmed she has medicare part D coverage.  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    #neuropathy - secondary to PAD. Has not taken gabapentin since April 2019.  - monitor.  Fluids: none  Electrolytes: replete K  Nutrition: heart modified GI ppx: none DVT px: lovenox 20.   Disposition: SNF  Medications: Scheduled Meds: . aspirin EC  81 mg Oral Daily  . atorvastatin  80 mg Oral q1800  . enoxaparin (LOVENOX) injection  20 mg Subcutaneous Q24H  . Influenza vac split quadrivalent PF  0.5 mL Intramuscular Tomorrow-1000  . isosorbide mononitrate  30 mg Oral Daily  . levofloxacin  500 mg Oral Q24H  . mouth rinse  15 mL Mouth Rinse BID   Continuous Infusions: PRN Meds: acetaminophen  ================================================= ================================================= Subjective:  Patient states  her chest pain is improved.  Her diarrhea is more solid.  Per there nurse, there have been no reports of BRB or melena. Patient states she 'eats like a pig' at home, but then admitted to being a picky eater after being told she was underweight.    Objective: Temp:  [97.5 F (36.4 C)-98.2 F (36.8 C)] 97.8 F (36.6 C) (09/25 2355) Pulse Rate:  [36-62]  58 (09/25 2355) Resp:  [17-23] 19 (09/25 1145) BP: (104-154)/(48-91) 104/52 (09/25 2355) SpO2:  [86 %-100 %] 97 % (09/25 2355) Weight:  [33 kg] 33 kg (09/25 1237) Intake/Output 09/25 0701 - 09/26 0700 In: 240 [P.O.:240] Out: 60 [Urine:60] Physical Exam:  Gen: NAD, alert.  Laying in bed.  HEENT: Normocephaic, atraumatic. Blind.   CV: bradycardic. Normal S1-S2.  No bilateral lower extremity edema. Resp: Clear to auscultation bilaterally.  No wheezing, rales, abnormal lung sounds.  No increased work of breathing appreciated. Abd: tender to palpation in lower quadrants.  Positive bowel sounds. Abdominal bruit heard on right side Psych: Cooperative with exam. Pleasant.    Laboratory: Recent Labs  Lab 08/30/18 1702 08/31/18 0408  WBC 10.4 10.1  HGB 18.5* 13.0  HCT 55.4* 40.5  PLT 124* 235   Recent Labs  Lab 08/30/18 1702 08/30/18 2155 08/31/18 0052 08/31/18 0408  NA 143  --  141 141  K 2.6*  --  4.0 3.6  CL 108  --  109 108  CO2 19*  --  22 22  BUN 6*  --  6* 5*  CREATININE 0.80  --  0.66 0.64  CALCIUM 8.9  --  8.1* 8.2*  PROT  --  7.5  --   --   BILITOT  --  1.1  --   --   ALKPHOS  --  99  --   --   ALT  --  10  --   --   AST  --  20  --   --   GLUCOSE 93  --  90 91    Imaging/Diagnostic Tests: Dg Chest 2 View  Result Date: 08/30/2018 CLINICAL DATA:  Chest pain EXAM: CHEST - 2 VIEW COMPARISON:  06/04/2018 chest radiograph. FINDINGS: Stable cardiomediastinal silhouette with normal heart size. No pneumothorax. No pleural effusion. Hyperinflated lungs. No pulmonary edema. There is new bandlike opacity in the right middle lobe. Otherwise no consolidative airspace disease. IMPRESSION: 1. New bandlike opacity in the right middle lobe, which could represent atelectasis and/or pneumonia. Recommend follow-up PA and lateral post treatment chest radiographs in 4-6 weeks. 2. Hyperinflated lungs, suggesting COPD. Electronically Signed   By: Delbert Phenix M.D.   On: 08/30/2018 19:02    Ct Angio Chest Pe W/cm &/or Wo Cm  Result Date: 08/30/2018 CLINICAL DATA:  Abnormal chest radiograph. Dyspnea. Current smoker. Chest pain. Abdominal pain. Diarrhea. EXAM: CT ANGIOGRAPHY CHEST CT ABDOMEN AND PELVIS WITH CONTRAST TECHNIQUE: Multidetector CT imaging of the chest was performed using the standard protocol during bolus administration of intravenous contrast. Multiplanar CT image reconstructions and MIPs were obtained to evaluate the vascular anatomy. Multidetector CT imaging of the abdomen and pelvis was performed using the standard protocol during bolus administration of intravenous contrast. CONTRAST:  ISOVUE-370 IOPAMIDOL (ISOVUE-370) INJECTION 76% COMPARISON:  Chest radiograph from earlier today. 04/01/2018 chest CT angiogram. FINDINGS: CTA CHEST FINDINGS Cardiovascular: The study is high quality for the evaluation of pulmonary embolism. There are no filling defects in the central, lobar, segmental or subsegmental pulmonary artery branches to suggest acute  pulmonary embolism. Atherosclerotic nonaneurysmal thoracic aorta. Normal caliber pulmonary arteries. Normal heart size. No significant pericardial fluid/thickening. Three-vessel coronary atherosclerosis. Mediastinum/Nodes: No discrete thyroid nodules. Unremarkable esophagus. No pathologically enlarged axillary, mediastinal or hilar lymph nodes. Lungs/Pleura: No pneumothorax. No pleural effusion. Mild centrilobular emphysema with diffuse bronchial wall thickening. No lung masses or significant pulmonary nodules. New patchy consolidation with some volume loss throughout the dependent right middle lobe. New patchy tree-in-bud opacity in the basilar right lower lobe. Musculoskeletal: No aggressive appearing focal osseous lesions. Healed deformity in the posterior left tenth rib. Stable chronic moderate T3 vertebral compression fracture. Mild superior T7 vertebral compression fracture is new since 04/01/2018. Moderate T11 vertebral  compression fracture is stable. Review of the MIP images confirms the above findings. CT ABDOMEN and PELVIS FINDINGS Hepatobiliary: Normal liver size with Riedel's lobe configuration of the right liver lobe. No liver mass. Normal gallbladder with no radiopaque cholelithiasis. No biliary ductal dilatation. Pancreas: Normal, with no mass or duct dilation. Spleen: Normal size. No mass. Adrenals/Urinary Tract: Normal adrenals. Normal kidneys with no hydronephrosis and no renal mass. Normal nondistended bladder. Stomach/Bowel: Normal non-distended stomach. Normal caliber small bowel with no small bowel wall thickening. Appendix not discretely visualized. No pericecal inflammatory changes. Normal large bowel with no diverticulosis, large bowel wall thickening or pericolonic fat stranding. Vascular/Lymphatic: Atherosclerotic nonaneurysmal abdominal aorta. 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 Bilateral common iliac arteries appear occluded. Collateral arteries are seen reconstituting the proximal femoral arteries. No pathologically enlarged lymph nodes in the abdomen or pelvis. Reproductive: Grossly normal uterus.  No adnexal mass. Other: No pneumoperitoneum, ascites or focal fluid collection. Musculoskeletal: No aggressive appearing focal osseous lesions. Moderate to severe L1 vertebral compression fracture is new since 04/01/2018 CT. Mild lumbar spondylosis. Review of the MIP images confirms the above findings. IMPRESSION: 1. New patchy consolidation with some volume loss throughout the dependent right middle lobe with new patchy tree-in-bud opacity in the right lower lobe, compatible with multilobar pneumonia. 2. No pulmonary embolism. 3. Mild superior T7 vertebral compression fracture and moderate to severe L1 vertebral compression fracture, both new since 04/01/2018 CT, possibly acute or subacute. 4. 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 5. Three-vessel coronary atherosclerosis. 6. Aortic Atherosclerosis (ICD10-I70.0) and Emphysema (ICD10-J43.9). Electronically Signed   By: Delbert Phenix M.D.   On: 08/30/2018 21:43   Ct Abdomen Pelvis W Contrast  Result Date: 08/30/2018 CLINICAL DATA:  Abnormal chest radiograph. Dyspnea. Current smoker. Chest pain. Abdominal pain. Diarrhea. EXAM: CT ANGIOGRAPHY CHEST CT ABDOMEN AND PELVIS WITH CONTRAST TECHNIQUE: Multidetector CT imaging of the chest was performed using the standard protocol during bolus administration of intravenous contrast. Multiplanar CT image reconstructions and MIPs were obtained to evaluate the vascular anatomy. Multidetector CT imaging of the abdomen and pelvis was performed using the standard protocol during bolus administration of intravenous contrast. CONTRAST:  ISOVUE-370 IOPAMIDOL (ISOVUE-370) INJECTION 76% COMPARISON:  Chest radiograph from earlier today. 04/01/2018 chest CT angiogram. FINDINGS: CTA CHEST FINDINGS Cardiovascular: The study is high quality for the evaluation of pulmonary embolism. There are no filling defects in the central, lobar, segmental or subsegmental pulmonary artery branches to suggest acute pulmonary embolism. Atherosclerotic nonaneurysmal thoracic aorta. Normal caliber pulmonary arteries. Normal heart size. No significant pericardial fluid/thickening. Three-vessel coronary atherosclerosis. Mediastinum/Nodes: No discrete thyroid nodules. Unremarkable esophagus. No pathologically enlarged axillary, mediastinal or hilar lymph nodes. Lungs/Pleura: No pneumothorax. No pleural  effusion. Mild centrilobular emphysema with diffuse bronchial wall thickening. No lung masses or significant pulmonary nodules. New patchy consolidation with some volume loss throughout the dependent right middle lobe. New patchy tree-in-bud opacity in the basilar right lower lobe. Musculoskeletal: No aggressive  appearing focal osseous lesions. Healed deformity in the posterior left tenth rib. Stable chronic moderate T3 vertebral compression fracture. Mild superior T7 vertebral compression fracture is new since 04/01/2018. Moderate T11 vertebral compression fracture is stable. Review of the MIP images confirms the above findings. CT ABDOMEN and PELVIS FINDINGS Hepatobiliary: Normal liver size with Riedel's lobe configuration of the right liver lobe. No liver mass. Normal gallbladder with no radiopaque cholelithiasis. No biliary ductal dilatation. Pancreas: Normal, with no mass or duct dilation. Spleen: Normal size. No mass. Adrenals/Urinary Tract: Normal adrenals. Normal kidneys with no hydronephrosis and no renal mass. Normal nondistended bladder. Stomach/Bowel: Normal non-distended stomach. Normal caliber small bowel with no small bowel wall thickening. Appendix not discretely visualized. No pericecal inflammatory changes. Normal large bowel with no diverticulosis, large bowel wall thickening or pericolonic fat stranding. Vascular/Lymphatic: Atherosclerotic nonaneurysmal abdominal aorta. 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 Bilateral common iliac arteries appear occluded. Collateral arteries are seen reconstituting the proximal femoral arteries. No pathologically enlarged lymph nodes in the abdomen or pelvis. Reproductive: Grossly normal uterus.  No adnexal mass. Other: No pneumoperitoneum, ascites or focal fluid collection. Musculoskeletal: No aggressive appearing focal osseous lesions. Moderate to severe L1 vertebral compression fracture is new since 04/01/2018 CT. Mild lumbar spondylosis. Review of the MIP images confirms the above findings. IMPRESSION: 1. New patchy consolidation with some volume loss throughout the dependent right middle lobe with new patchy tree-in-bud opacity in the right lower lobe, compatible with multilobar pneumonia. 2. No pulmonary embolism. 3. Mild superior  T7 vertebral compression fracture and moderate to severe L1 vertebral compression fracture, both new since 04/01/2018 CT, possibly acute or subacute. 4. 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 5. Three-vessel coronary atherosclerosis. 6. Aortic Atherosclerosis (ICD10-I70.0) and Emphysema (ICD10-J43.9). Electronically Signed   By: Delbert Phenix M.D.   On: 08/30/2018 21:43    Sandre Kitty, MD 09/01/2018, 6:06 AM PGY-1, [facility] Family Medicine FPTS Intern pager: [phone], text pages welcome

## 2018-09-01 NOTE — Evaluation (Signed)
Occupational Therapy Evaluation Patient Details Name: Mary Nguyen MRN: [id number] DOB: [birth year] Today's Date: 09/01/2018    History of Present Illness Pt is a 67 y/o female admitted secondary to chest pain. Imaging revealed likely R middle and lower lobe PNA, and showed new T7-L1 compression fxs. Imaging negative for PE. PMH includes blindness, CVA, PAD, CAD, and current smoker.    Clinical Impression   Pt was admitted as above & is currently demonstrating deficits in her ability to perform functional mobility/transfers and all self care tasks (Please refer to OT problem list below). Pt requires min-mod A for functional mobility and transfers w/ consistent vc's and tc's secondary to blindness. She should benefit from 24/7 A at [facility] following hospital stay.    Follow Up Recommendations  SNF;Supervision/Assistance - 24 hour    Equipment Recommendations  Other (comment)(Defer to next venue)    Recommendations for Other Services       Precautions / Restrictions Precautions Precautions: Fall;Back Precaution Booklet Issued: No Precaution Comments: Reviewed back precautions with pt Restrictions Weight Bearing Restrictions: No      Mobility Bed Mobility Overal bed mobility: Needs Assistance Bed Mobility: Sidelying to Sit;Rolling;Sit to Sidelying Rolling: Mod assist Sidelying to sit: Min assist;Mod assist     Sit to sidelying: Mod assist General bed mobility comments: Mod A for assist with rolling, trunk elevation and LE management to sit at EOB. Verbal cues for use of log roll technique. Required mod A for return to sidelying for LE assist and assist with trunk descent.   Transfers Overall transfer level: Needs assistance Equipment used: 1 person hand held assist Transfers: Sit to/from [employer] Sit to Stand: Mod assist Stand pivot transfers: Min assist;Mod assist            Balance Overall balance assessment: Needs assistance Sitting-balance  support: Single extremity supported;Feet supported Sitting balance-Leahy Scale: Fair Sitting balance - Comments: Reliant on close supervision for balance sitting up at EOB and on 3:1.    Standing balance support: Bilateral upper extremity supported Standing balance-Leahy Scale: Poor Standing balance comment: Reliant on BUE support and external support for balance.                            ADL either performed or assessed with clinical judgement   ADL Overall ADL's : Needs assistance/impaired(Pt is blind) Eating/Feeding: Set up;Bed level;Minimal assistance Eating/Feeding Details (indicate cue type and reason): VC's secondary to blindness Grooming: Set up;Sitting   Upper Body Bathing: Minimal assistance;Sitting   Lower Body Bathing: Maximal assistance;Sit to/from stand   Upper Body Dressing : Set up;Min guard;Sitting   Lower Body Dressing: Maximal assistance;Sit to/from stand   Toilet Transfer: Minimal assistance;Moderate assistance;Stand-pivot;BSC;Cueing for safety;Cueing for sequencing Toilet Transfer Details (indicate cue type and reason): VC's for safety/sequencing secondary to blindness Toileting- Clothing Manipulation and Hygiene: Maximal assistance;Sit to/from stand Toileting - Clothing Manipulation Details (indicate cue type and reason): Pt is Min-mod A sit to stand from 3:1, however she requires Max A for hygiene     Functional mobility during ADLs: Minimal assistance;Moderate assistance;Cueing for safety;Cueing for sequencing(Hand held assist; Min-Mod for SPT to 3:1 and side stepping along bed) General ADL Comments: Pt was seen for OT assessment followed by participation in ADL retraining session today with focus on bed mobility, toileting, functional transfers. She requested getting to 3:1 but had BM in bed while OT was getting the 3:1, she was assisted in cleaning up and then  sat on 3:1 at bedside & went a bit more, bed/padding was changed & pt was washed up (Max  A). She was also educated in role of OT and and discussed plan of care. Pt requires min-mod A for functional mobility and transfers w/ consistent vc's and tc's secondary to blindness. She should benefit from 24/7 A at [facility] following hospital stay.     Vision Baseline Vision/History: (Blind) Patient Visual Report: (Pt is blind)       Perception     Praxis      Pertinent Vitals/Pain Pain Assessment: Faces Faces Pain Scale: Hurts little more Pain Location: Abdomen/back Pain Descriptors / Indicators: Guarding;Grimacing Pain Intervention(s): Limited activity within patient's tolerance;Monitored during session;Repositioned     Hand Dominance Right(Uses left secondary to old CVA)   Extremity/Trunk Assessment Upper Extremity Assessment Upper Extremity Assessment: Generalized weakness(Old CVA affecting RUE, uses LUE as primary UE for ADL/eating)   Lower Extremity Assessment Lower Extremity Assessment: Defer to PT evaluation   Cervical / Trunk Assessment Cervical / Trunk Assessment: Other exceptions Cervical / Trunk Exceptions: New T7, L1 compression fx.    Communication Communication Communication: No difficulties   Cognition Arousal/Alertness: Awake/alert Behavior During Therapy: WFL for tasks assessed/performed Overall Cognitive Status: No family/caregiver present to determine baseline cognitive functioning                                     General Comments  No family present during Assessment     Exercises     Shoulder Instructions      Home Living Family/patient expects to be discharged to:: Inpatient rehab Living Arrangements: Non-relatives/Friends Available Help at Discharge: Friend(s);Available PRN/intermittently Type of Home: Other(Comment)(hotel) Home Access: Stairs to enter Entrance Stairs-Number of Steps: 1(threshold) Entrance Stairs-Rails: None Home Layout: One level     Bathroom Shower/Tub: Chief Strategy Officer:  Standard     Home Equipment: None          Prior Functioning/Environment Level of Independence: Needs assistance  Gait / Transfers Assistance Needed: Reports she does not use AD for ambulation  ADL's / Homemaking Assistance Needed: assist for sponge bath per previous notes             OT Problem List: Decreased activity tolerance;Decreased strength;Impaired vision/perception;Decreased knowledge of use of DME or AE;Pain;Impaired balance (sitting and/or standing)(Old CVA affecting use of R UE; uses LUE as primary UE)      OT Treatment/Interventions: Self-care/ADL training;DME and/or AE instruction;Therapeutic activities;Patient/family education;Balance training;Visual/perceptual remediation/compensation    OT Goals(Current goals can be found in the care plan section) Acute Rehab OT Goals Patient Stated Goal: Get better OT Goal Formulation: With patient Time For Goal Achievement: 09/15/18 Potential to Achieve Goals: Good  OT Frequency: Min 2X/week   Barriers to D/C:            Co-evaluation              AM-PAC PT "6 Clicks" Daily Activity     Outcome Measure Help from another person eating meals?: A Little Help from another person taking care of personal grooming?: A Little Help from another person toileting, which includes using toliet, bedpan, or urinal?: A Lot Help from another person bathing (including washing, rinsing, drying)?: A Lot Help from another person to put on and taking off regular upper body clothing?: A Little Help from another person to put on and taking off regular lower  body clothing?: A Lot 6 Click Score: 15   End of Session Equipment Utilized During Treatment: Gait belt Nurse Communication: Mobility status;Other (comment)(Pt had BM in bed, was assisted to 3:1 and cleaned up)  Activity Tolerance: Patient tolerated treatment well Patient left: in bed;with call bell/phone within reach;with bed alarm set  OT Visit Diagnosis: Unsteadiness on  feet (R26.81);Muscle weakness (generalized) (M62.81);Pain Pain - Right/Left: (Back and abdomen) Pain - part of body: (See above)                Time: 5621-3086 OT Time Calculation (min): 32 min Charges:  OT General Charges $OT Visit: 1 Visit OT Evaluation $OT Eval Moderate Complexity: 1 Mod OT Treatments $Self Care/Home Management : 8-22 mins   [place], Amy Beth Dixon, OTR/L 09/01/2018, 11:30 AM

## 2018-09-02 MED ORDER — MIRTAZAPINE 15 MG PO TABS
7.5000 mg | ORAL_TABLET | Freq: Every day | ORAL | Status: DC
  Administered 2018-09-02 – 2018-09-06 (×5): 7.5 mg via ORAL
  Filled 2018-09-02 (×5): qty 1

## 2018-09-02 MED ORDER — ACETAMINOPHEN 325 MG PO TABS
650.0000 mg | ORAL_TABLET | Freq: Four times a day (QID) | ORAL | Status: DC
  Administered 2018-09-02 – 2018-09-07 (×19): 650 mg via ORAL
  Filled 2018-09-02 (×20): qty 2

## 2018-09-02 MED ORDER — LIDOCAINE 5 % EX PTCH
1.0000 | MEDICATED_PATCH | Freq: Two times a day (BID) | CUTANEOUS | Status: DC
  Administered 2018-09-02 – 2018-09-07 (×11): 1 via TRANSDERMAL
  Filled 2018-09-02 (×11): qty 1

## 2018-09-02 NOTE — Progress Notes (Signed)
Physical Therapy Treatment Patient Details Name: Mary Nguyen MRN: [id number] DOB: May 07, 1951 Today's Date: 09/02/2018    History of Present Illness Pt is a 67 y/o female admitted secondary to chest pain. Imaging revealed likely R middle and lower lobe PNA, and showed new T7-L1 compression fxs. Imaging negative for PE. PMH includes blindness, CVA, PAD, CAD, and current smoker.     PT Comments    Pt willing to work with therapy, however does not want to get up to sit in chair. Pt limited in safe mobility by decreased vision and stability. Pt currently min A for bed mobility and sit<>stand transfers. Pt able to stand with UE support on therapist for 3x 2 minutes. D/c plans remain appropriate. PT will continue to follow acutely.   Follow Up Recommendations  SNF;Supervision/Assistance - 24 hour     Equipment Recommendations       Recommendations for Other Services       Precautions / Restrictions Precautions Precautions: Fall;Back Precaution Booklet Issued: No Precaution Comments: Reviewed back precautions with pt Restrictions Weight Bearing Restrictions: No    Mobility  Bed Mobility Overal bed mobility: Needs Assistance     Sidelying to sit: Min assist;Mod assist     Sit to sidelying: Mod assist General bed mobility comments: Mod A for assist with rolling, trunk elevation and LE management to sit at EOB. Verbal cues for use of log roll technique. Required mod A for return to sidelying for LE assist and assist with trunk descent.   Transfers Overall transfer level: Needs assistance Equipment used: 1 person hand held assist Transfers: Sit to/from [employer] Sit to Stand: Min assist         General transfer comment: minA for sit<>stand from bed x 3       Balance Overall balance assessment: Needs assistance Sitting-balance support: Single extremity supported;Feet supported Sitting balance-Leahy Scale: Fair     Standing balance support:  Bilateral upper extremity supported Standing balance-Leahy Scale: Poor Standing balance comment: Reliant on BUE support and external support for balance.                             Cognition Arousal/Alertness: Awake/alert Behavior During Therapy: WFL for tasks assessed/performed Overall Cognitive Status: No family/caregiver present to determine baseline cognitive functioning                                           General Comments General comments (skin integrity, edema, etc.): VSS      Pertinent Vitals/Pain Pain Assessment: 0-10 Pain Score: 8  Pain Location: L shoulder and neck 8/10, back 4/10 Pain Descriptors / Indicators: Guarding;Grimacing Pain Intervention(s): Limited activity within patient's tolerance;Monitored during session;Repositioned           PT Goals (current goals can now be found in the care plan section) Acute Rehab PT Goals Patient Stated Goal: Get better PT Goal Formulation: With patient Time For Goal Achievement: 09/14/18 Potential to Achieve Goals: Fair Progress towards PT goals: Progressing toward goals    Frequency    Min 2X/week      PT Plan Current plan remains appropriate       AM-PAC PT "6 Clicks" Daily Activity  Outcome Measure  Difficulty turning over in bed (including adjusting bedclothes, sheets and blankets)?: Unable Difficulty moving from lying on back to sitting on the  side of the bed? : Unable Difficulty sitting down on and standing up from a chair with arms (e.g., wheelchair, bedside commode, etc,.)?: Unable Help needed moving to and from a bed to chair (including a wheelchair)?: Total Help needed walking in hospital room?: Total Help needed climbing 3-5 steps with a railing? : Total 6 Click Score: 6    End of Session   Activity Tolerance: Patient limited by fatigue Patient left: in bed;with call bell/phone within reach Nurse Communication: Mobility status PT Visit Diagnosis: Unsteadiness  on feet (R26.81);Muscle weakness (generalized) (M62.81);History of falling (Z91.81);Pain Pain - part of body: Shoulder(back )     Time: 1610-9604 PT Time Calculation (min) (ACUTE ONLY): 17 min  Charges:  $Therapeutic Activity: 8-22 mins                     Mary Jacquin B. Beverely Risen PT, DPT Acute Rehabilitation Services Pager [phone] Office [phone]    Elon Alas [facility] 09/02/2018, 4:35 PM

## 2018-09-02 NOTE — Evaluation (Signed)
Clinical/Bedside Swallow Evaluation Patient Details  Name: Mary Nguyen MRN: [id number] Date of Birth: 1951/10/22  Today's Date: 09/02/2018 Time: SLP Start Time (ACUTE ONLY): 4098 SLP Stop Time (ACUTE ONLY): 0940 SLP Time Calculation (min) (ACUTE ONLY): 13 min  Past Medical History:  Past Medical History:  Diagnosis Date  . Blindness   . Bradycardia    a. pt reports hx of slow HR.  Marland Kitchen CAD in native artery    a. pt reports "blockage in the back of her heart" sometime in 2018 by cath at [facility].  . Carotid artery disease (HCC)    a. pt reports "blockage scraped" R neck artery around 2000.  . Cataract   . Former tobacco use   . Heart murmur   . Hyperlipidemia   . PAD (peripheral artery disease) (HCC)    a. she was told she had poor circulation from the waist down.  . Skipped heart beats   . Stroke [facility])    a. 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   Past Surgical History:  Past Surgical History:  Procedure Laterality Date  . LEFT HEART CATH AND CORONARY ANGIOGRAPHY N/A 04/04/2018   Procedure: LEFT HEART CATH AND CORONARY ANGIOGRAPHY;  Surgeon: Lyn Records, MD;  Location: [facility] INVASIVE CV LAB;  Service: Cardiovascular;  Laterality: N/A;  . TUBAL LIGATION     HPI:  Pt is a 67 y/o female admitted secondary to chest pain. Imaging revealed likely R middle and lower lobe PNA, and showed new T7-L1 compression fxs. Imaging negative for PE. PMH includes blindness, CVA, PAD, CAD, and current smoker.    Assessment / Plan / Recommendation Clinical Impression  Pt's oropharyngeal swallow appears functional; no overt signs of aspiration are observed. She does report a h/o GER but says that she had stopped taking her medicine "a while ago." She also intermittently requested liquid washes because the solids felt "hung." Suspect a primary esophageal dysphagia. May wish to consider esophageal assessment. SLP to f/u  briefly given concern for PNA. SLP Visit Diagnosis: Dysphagia, unspecified (R13.10)    Aspiration Risk  Mild aspiration risk    Diet Recommendation Regular;Thin liquid   Liquid Administration via: Cup;Straw Medication Administration: Whole meds with liquid(crush larger pills) Supervision: Staff to assist with self feeding Compensations: Slow rate;Small sips/bites;Follow solids with liquid Postural Changes: Seated upright at 90 degrees;Remain upright for at least 30 minutes after po intake    Other  Recommendations Recommended Consults: Consider esophageal assessment Oral Care Recommendations: Oral care BID   Follow up Recommendations None      Frequency and Duration min 2x/week  1 week       Prognosis        Swallow Study   General HPI: Pt is a 67 y/o female admitted secondary to chest pain. Imaging revealed likely R middle and lower lobe PNA, and showed new T7-L1 compression fxs. Imaging negative for PE. PMH includes blindness, CVA, PAD, CAD, and current smoker.  Type of Study: Bedside Swallow Evaluation Previous Swallow Assessment: none in chart Diet Prior to this Study: Regular;Thin liquids Temperature Spikes Noted: No Respiratory Status: Room air History of Recent Intubation: No Behavior/Cognition: Alert;Cooperative Oral Cavity Assessment: Within Functional Limits Oral Care Completed by SLP: No Oral Cavity - Dentition: Edentulous Vision: Impaired for self-feeding Self-Feeding Abilities: Needs assist Patient Positioning: Upright in bed Baseline Vocal Quality: Normal Volitional Cough: Strong Volitional Swallow: Able  to elicit    Oral/Motor/Sensory Function Overall Oral Motor/Sensory Function: Within functional limits   Ice Chips Ice chips: Not tested   Thin Liquid Thin Liquid: Within functional limits Presentation: Straw;Self Fed    Nectar Thick Nectar Thick Liquid: Not tested   Honey Thick Honey Thick Liquid: Not tested   Puree Puree: Not tested(pt refused)    Solid     Solid: Within functional limits Presentation: Self Georjean Mode 09/02/2018,10:03 AM  Maxcine Ham, M.A. CCC-SLP Acute Herbalist [phone] Office [phone]

## 2018-09-02 NOTE — Progress Notes (Signed)
Family Medicine Teaching Service Daily Progress Note Intern Pager: [phone]  Patient name: Mary Nguyen Medical record number: [id number] Date of birth: 06/01/1951 Age: 67 y.o. Gender: female  Primary Care Provider: Patient, No Pcp Per Consultants: palliative, nutrition, case mgmt, social work, pt/ot Code Status: Full code  Pt Overview and Major Events to Date:  Hospital Day 2 Admitted: 08/30/2018  Assessment and Plan: Mary Nguyen a 67 y.o.femalepresenting with chest pain and pneumonia. PMH is significant forunstable angina, CVAx 4, hyperlipidemia, blindness, PAD, CAD, bradycardia, and current smoker.  #Pneumonia- patient not coughing today.  Feels well with no shortness of breath. 2 wkor 3-4 dhx of drycough.CXR andCt indicates multilobar pna.No elevated white count or fever.legionella/strep pneumo neg. - levaquin 500mg x 5d. (9/25-9/29) - monitor for worsening  #chest painw/ bradycardia- chest pain resolved. Still bradycardic.EKG showed bradycardia below 40 bpm.Cards consult says imdur, asa, statin, and consider amlodipine or ranexa for continued chest pain.  -continuous pulse ox -PT/OT - PT suggests SNF -am BMP, CBC  #vertebral fractures - patient states back is hurting her mor today. T7, L1 fracture. This is new since April. Patient does not remember any traumatic precipitating event. - make tylenol scheduled.   -lidocaine patch for pain.  - vit. D and calcium - start bisphosphonate as outpatient. - SNF  #ulcer, toe - patient has shallow ulcer on her toe due to peripheral vascular disease.  Will continue to monitor for worsening.    -dysuria- new onset from early this am when after purewick catheter placed and had BM. Urinalysis showed ketonuria 20, trace leukocytes, no nitrites. -currently on levaquin  # malnutrition/cachexia - per nurse, the patient ate her meals yesterday. patient is frail. BMI is 13.31. Patient has been living with  caregiver in extended stay motel.  - start mirtazapine - nutrition consult - appreciate recs -placed social work consult -palliative consult - Write for palliative outpatient to see at SNF. Would recommend trial of mirtazapine and/or SSRI - SLP consult. - consider esophageal assessment for dysphagia based on pt history.    #diarrheaw/ abdominal pain- improving.  new onset diarrhea for past week, per patient. This complaint is mentioned in ED notesas far back as April.nurse states bowel movements are more solid. Patient still tender to palpation in lower quadrants. No wbc count. 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 Currently on levaquin - enteric precautions - on levaquin -supportive therapy  #social concerns- removed from SNF in [place] and lives with family with APS involvement. No longer taking meds prescribed at SNF. Weight loss and decreased appetite. Lives in [place] w/ caregiver jasmine. Has been staying at hotel since at least April. Used to stay with son, nicholas. Case management saw patient and confirmed she has medicare part D coverage.  -social workconsult  #blindness,botheye- since 2002 2/2 to stroke.   #neuropathy - secondary to PAD. Has not taken gabapentin since April 2019.  - monitor.  Fluids:none Electrolytes:replete PRN Nutrition:heart modified GI AOZ:HYQM DVT VH:QIONGEX 20.  Disposition:SNF   Medications: Scheduled Meds: . aspirin EC  81 mg Oral Daily  . atorvastatin  80 mg Oral q1800  . calcium-vitamin D  1 tablet Oral Q breakfast  . enoxaparin (LOVENOX) injection  20 mg Subcutaneous Q24H  . isosorbide mononitrate  30 mg Oral Daily  . levofloxacin  500 mg Oral Q24H  . mouth rinse  15 mL Mouth Rinse BID   Continuous Infusions: PRN Meds: acetaminophen  ================================================= ================================================= Subjective:  Patient states her back is hurting her more today.  It is  hurting int  he same location that it had been previously.  Patient stated she would like a medicine to help with her appetite.    Objective: Temp:  [97.5 F (36.4 C)-98.5 F (36.9 C)] 98.5 F (36.9 C) (09/26 2343) Pulse Rate:  [60] 60 (09/26 2343) Resp:  [14] 14 (09/26 2343) BP: (102-133)/(49-64) 102/49 (09/26 2343) SpO2:  [86 %-98 %] 86 % (09/26 2343) Intake/Output 09/26 0701 - 09/27 0700 In: 250 [P.O.:250] Out: 300 [Urine:300] Physical Exam:  Gen: NAD, alert, non-toxic. Cachectic. CV: Regular rate and rhythm. Not bradycardic at this time  Normal capillary refill bilaterally.  Pulses difficult to palpate. No bilateral lower extremity edema. Resp: Clear to auscultation bilaterally.  No wheezing, rales, abnormal lung sounds.  No increased work of breathing appreciated. Abd: LLQ tenderness to palpation  Positive bowel sounds. Psych: Cooperative with exam. Pleasant.  Laboratory: Recent Labs  Lab 08/30/18 1702 08/31/18 0408 09/01/18 0854  WBC 10.4 10.1 7.6  HGB 18.5* 13.0 12.2  HCT 55.4* 40.5 36.9  PLT 124* 235 206   Recent Labs  Lab 08/30/18 2155 08/31/18 0052 08/31/18 0408 09/01/18 0854  NA  --  141 141 142  K  --  4.0 3.6 4.2  CL  --  109 108 111  CO2  --  22 22 19*  BUN  --  6* 5* 5*  CREATININE  --  0.66 0.64 0.66  CALCIUM  --  8.1* 8.2* 8.4*  PROT 7.5  --   --   --   BILITOT 1.1  --   --   --   ALKPHOS 99  --   --   --   ALT 10  --   --   --   AST 20  --   --   --   GLUCOSE  --  90 91 89    Imaging/Diagnostic Tests: No results found.    Sandre Kitty, MD 09/02/2018, 5:21 AM PGY-1, [facility] Family Medicine FPTS Intern pager: [phone], text pages welcome

## 2018-09-02 NOTE — NC FL2 (Signed)
[place] MEDICAID FL2 LEVEL OF CARE SCREENING TOOL     IDENTIFICATION  Patient Name: Mary Nguyen Birthdate: 10-05-1951 Sex: female Admission Date (Current Location): 08/30/2018  [facility] and [place] Number:  Producer, television/film/video and Address:  [facility] [place], [address] [place], [place] [address]      Provider Number: [id number]  Attending Physician Name and Address:  Moses Manners, MD  Relative Name and Phone Number:       Current Level of Care: Hospital Recommended Level of Care: Skilled Nursing Facility Prior Approval Number:    Date Approved/Denied:   PASRR Number: [id number]  Discharge Plan: SNF    Current Diagnoses: Patient Active Problem List   Diagnosis Date Noted  . Failure to thrive in adult   . Palliative care by specialist   . Goals of care, counseling/discussion   . Depression   . CAP (community acquired pneumonia) 08/31/2018  . Hypokalemia   . Multifocal pneumonia   . High risk social situation   . Blind   . Tobacco abuse   . Osteoporosis with current pathological fracture   . Severe protein-calorie malnutrition (HCC)   . Angina at rest [facility]) 04/04/2018  . Precordial pain   . History of stroke 04/02/2018  . Hyperlipidemia 04/02/2018  . Unstable angina (HCC) 04/01/2018    Orientation RESPIRATION BLADDER Height & Weight     Self, Time, Situation, Place  Normal Incontinent Weight: 72 lb 12 oz (33 kg) Height:  5\' 2"  (157.5 cm)  BEHAVIORAL SYMPTOMS/MOOD NEUROLOGICAL BOWEL NUTRITION STATUS      Incontinent Diet(heart healthy, thin liquids)  AMBULATORY STATUS COMMUNICATION OF NEEDS Skin   Limited Assist Verbally Normal                       Personal Care Assistance Level of Assistance  Bathing, Feeding, Dressing Bathing Assistance: Limited assistance Feeding assistance: Independent Dressing Assistance: Limited assistance     Functional Limitations Info  Sight, Hearing, Speech Sight Info:  Adequate Hearing Info: Adequate Speech Info: Adequate    SPECIAL CARE FACTORS FREQUENCY  PT (By licensed PT), OT (By licensed OT)     PT Frequency: 2x OT Frequency: 2x            Contractures Contractures Info: Not present    Additional Factors Info  Code Status, Allergies Code Status Info: full code Allergies Info: penicillins           Current Medications (09/02/2018):  This is the current hospital active medication list Current Facility-Administered Medications  Medication Dose Route Frequency Provider Last Rate Last Dose  . acetaminophen (TYLENOL) tablet 650 mg  650 mg Oral Q6H Melene Plan, MD   650 mg at 09/02/18 1253  . aspirin EC tablet 81 mg  81 mg Oral Daily Melene Plan, MD   81 mg at 09/02/18 4403  . atorvastatin (LIPITOR) tablet 80 mg  80 mg Oral q1800 Jeneen Rinks J, DO   80 mg at 08/31/18 1842  . calcium-vitamin D (OSCAL WITH D) 500-200 MG-UNIT per tablet 1 tablet  1 tablet Oral Q breakfast Jeneen Rinks J, DO   1 tablet at 09/02/18 0827  . enoxaparin (LOVENOX) injection 20 mg  20 mg Subcutaneous Q24H Garth Bigness, MD   20 mg at 09/02/18 4742  . isosorbide mononitrate (IMDUR) 24 hr tablet 30 mg  30 mg Oral Daily Melene Plan, MD   30 mg at 09/02/18 5956  .  levofloxacin (LEVAQUIN) tablet 500 mg  500 mg Oral Q24H Moses Manners, MD   500 mg at 09/01/18 2257  . lidocaine (LIDODERM) 5 % 1 patch  1 patch Transdermal Q12H Melene Plan, MD   1 patch at 09/02/18 1253  . MEDLINE mouth rinse  15 mL Mouth Rinse BID Moses Manners, MD   15 mL at 09/02/18 0828  . mirtazapine (REMERON) tablet 7.5 mg  7.5 mg Oral QHS Melene Plan, MD         Discharge Medications: Please see discharge summary for a list of discharge medications.  Relevant Imaging Results:  Relevant Lab Results:   Additional Information SS# [id number]  Maree Krabbe, LCSW

## 2018-09-02 NOTE — Progress Notes (Signed)
   09/02/18 1101  Clinical Encounter Type  Visited With Patient  Visit Type Initial;Spiritual support;Social support  Referral From Palliative care team  Consult/Referral To Chaplain  Spiritual Encounters  Spiritual Needs Prayer  Chaplain responded to consult request for spiritual care and Advance Directive.  The chaplain entered and introduced herself as the chaplain. The Pt. greeted the chaplain with positive energy and a smile.  The chaplain inquired about Pt. progress with AD.  Pt. stated son is Management consultant. Chaplain clarified Pt. son will be decision maker without documentation.  Chaplain encouraged conversations on Pt. Care with son.  Pt. indicated to Chaplain she and God have control of her life.  Pt. Indicated the pneumonia is getting better and she is looking forward to community in SNF. Chaplain affirmed Pt. people skills in their time together.  Chaplain asked Pt. how can she continue to be spiritually present with Pt. at the hospital.  Pt. asked for prayer.  Chaplain responded with prayer. Pt. thanked chaplain and said "I like you, thank you for visiting."

## 2018-09-02 NOTE — Plan of Care (Signed)
Discussed plan of care for the evening with patient.  Emphasized the importance of using the call button when assistance is needed.   Patient is blind and she frequently asks what time it is.  Minimal teach back displayed.

## 2018-09-03 DIAGNOSIS — I739 Peripheral vascular disease, unspecified: Secondary | ICD-10-CM

## 2018-09-03 LAB — BASIC METABOLIC PANEL
Anion gap: 8 (ref 5–15)
BUN: 8 mg/dL (ref 8–23)
CALCIUM: 9 mg/dL (ref 8.9–10.3)
CO2: 25 mmol/L (ref 22–32)
CREATININE: 0.69 mg/dL (ref 0.44–1.00)
Chloride: 107 mmol/L (ref 98–111)
GFR calc Af Amer: >60 mL/min (ref >60)
GFR calc non Af Amer: >60 mL/min (ref >60)
GLUCOSE: 102 mg/dL — AB (ref 70–99)
POTASSIUM: 3.4 mmol/L — AB (ref 3.5–5.1)
Sodium: 140 mmol/L (ref 135–145)

## 2018-09-03 MED ORDER — POTASSIUM CHLORIDE CRYS ER 20 MEQ PO TBCR
30.0000 meq | EXTENDED_RELEASE_TABLET | Freq: Once | ORAL | Status: AC
  Administered 2018-09-03: 30 meq via ORAL
  Filled 2018-09-03: qty 1

## 2018-09-03 MED ORDER — GABAPENTIN 100 MG PO CAPS
100.0000 mg | ORAL_CAPSULE | Freq: Three times a day (TID) | ORAL | Status: DC
  Administered 2018-09-03 – 2018-09-05 (×7): 100 mg via ORAL
  Filled 2018-09-03 (×7): qty 1

## 2018-09-03 NOTE — Progress Notes (Signed)
Family Medicine Teaching Service Daily Progress Note Intern Pager: [phone]  Patient name: Mary Nguyen Medical record number: [id number] Date of birth: [birth year] Age: 67 y.o. Gender: female  Primary Care Provider: Patient, No Pcp Per Consultants: palliative, nutrition, case mgmt, social work, pt/ot Code Status: Full code  Pt Overview and Major Events to Date:  Hospital Day 3 Admitted: 08/30/2018   Assessment and Plan: Yazmyn Hamiltonis a 67 y.o.femalepresenting with bradycardia and pneumonia. PMH is significant forunstable angina, CVAx 4, hyperlipidemia, blindness, PAD, CAD, bradycardia, and current smoker.  #Pneumonia- patient not coughing but states she coughed last night and it made her throw up. No shortness of breath today.  Will finish her levaquin today.  - levaquin500mg x 5d. (9/24-9/28).  - monitor for worsening  #chest painw/ bradycardia- chest painresolved. Patient thinks her chest pain is anxiety related. Heart rate bradycardic today but above 50. Cards consult says imdur, asa, statin, and consider amlodipine or ranexa for continued chest pain.  -continuous pulse ox -PT/OT- PT suggests SNF -am BMP, CBC  #vertebral fractures - no complaints of back pain today. T7, L1 fracture. This is new since April. Patient does not remember any traumatic precipitating event. - make tylenol scheduled.   -lidocaine patch for pain.  - vit. D and calcium - start bisphosphonate as outpatient. - SNF  #ulcer, toe - patient has shallow ulcer on her toe due to peripheral vascular disease. No change in today from previous exams. Will continue to monitor for worsening.    -dysuria- resolved. . Urinalysis showed ketonuria 20, trace leukocytes, no nitrites. -currently on levaquin. Last day today.   # malnutrition/cachexia -  BMI is 13.31. Patient has been living with caregiver in extended stay motel.  - started mirtazapine - nutrition consult - appreciate  recs -placedsocial work consult -palliative consult - Write for palliative outpatient to see at [facility].  - SLP consult. - consider esophageal assessment for dysphagia based on pt history.    #diarrheaw/ abdominal pain-diarrhea is improving but patient still having abdominal pain which she states is a 9/10.   Patient still tender to palpation in lower quadrants. 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 Currently on levaquin. Contact precautions due to EAEC - enteric precautions - on levaquin. Last day today.  -supportive therapy  #social concerns- removed from SNF in [place] and lives with family with APS involvement. No longer taking meds prescribed at SNF. Weight loss and decreased appetite. Lives in [place] w/ caregiver jasmine. Has been staying at hotel since at least April. Used to stay with son, nicholas.Case management saw patient and confirmed she has medicare part D coverage. -social workconsult - SNF placement  #blindness,botheye- patient stated she had eye pain yesterday but none today. 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   #neuropathy - secondary to PAD. Has not taken gabapentin since April 2019.  - restarting gabapentin at 100 TID. Increase as able.   Fluids:none Electrolytes:replete PRN Nutrition:heart modified GI ZHY:QMVH DVT QI:ONGEXBM 20.  Disposition:SNF  Medications: Scheduled Meds: . acetaminophen  650 mg Oral Q6H  . aspirin EC  81 mg Oral Daily  . atorvastatin  80 mg Oral q1800  . calcium-vitamin D  1 tablet Oral Q breakfast  . enoxaparin (LOVENOX) injection  20 mg Subcutaneous Q24H  . isosorbide mononitrate  30 mg Oral Daily  . levofloxacin  500 mg Oral Q24H  . lidocaine  1 patch Transdermal Q12H  . mouth rinse  15 mL Mouth Rinse BID  . mirtazapine  7.5 mg  Oral QHS   Continuous Infusions: PRN Meds:   ================================================= ================================================= Subjective:  Patient  states she is doing well.  She coughed last night and it caused her to vomit but it was just one episode.  She is still having 9/10 abdominal pain.  No dysuria, no chest pain. She enjoyed talking with the chaplain yesterday.     Objective: Temp:  [97.7 F (36.5 C)-98.3 F (36.8 C)] 97.7 F (36.5 C) (09/28 0009) Pulse Rate:  [40-72] 57 (09/28 0009) Resp:  [16-18] 18 (09/28 0009) BP: (104-128)/(49-71) 128/69 (09/28 0009) SpO2:  [93 %-100 %] 100 % (09/28 0009) Intake/Output 09/27 0701 - 09/28 0700 In: 240 [P.O.:240] Out: 950 [Urine:950] Physical Exam:  Gen: NAD, alert, non-toxic, well-nourished, well-appearing, sitting comfortably  HEENT: Normocephaic, atraumatic. Cataracts.  CV: bradycardic rate and regular rhythm.  Normal S1-S2.   Normal capillary refill bilaterally.   No bilateral lower extremity edema. Resp: Clear to auscultation bilaterally.  No wheezing, rales, abnormal lung sounds.  No increased work of breathing appreciated. Abd: diffuse abdominal tenderness to light touch with LLQ most sensitive.  Positive bowel sounds. Psych: Cooperative with exam. Pleasant. Extremities: stable shallow ulcer on Left 2nd toe.    Laboratory: Recent Labs  Lab 08/30/18 1702 08/31/18 0408 09/01/18 0854  WBC 10.4 10.1 7.6  HGB 18.5* 13.0 12.2  HCT 55.4* 40.5 36.9  PLT 124* 235 206   Recent Labs  Lab 08/30/18 2155  08/31/18 0408 09/01/18 0854 09/03/18 0233  NA  --    < > 141 142 140  K  --    < > 3.6 4.2 3.4*  CL  --    < > 108 111 107  CO2  --    < > 22 19* 25  BUN  --    < > 5* 5* 8  CREATININE  --    < > 0.64 0.66 0.69  CALCIUM  --    < > 8.2* 8.4* 9.0  PROT 7.5  --   --   --   --   BILITOT 1.1  --   --   --   --   ALKPHOS 99  --   --   --   --   ALT 10  --   --   --   --   AST 20  --   --   --   --   GLUCOSE  --    < > 91 89 102*   < > = values in this interval not displayed.   Imaging/Diagnostic Tests: No results found.    Sandre Kitty, MD 09/03/2018, 7:58  AM PGY-1, [facility] Family Medicine FPTS Intern pager: [phone], text pages welcome

## 2018-09-03 NOTE — Plan of Care (Signed)
Discussed plan of care for the evening with patient.  Stressed the importance of using the call button when assistance is needed.  Patient is ready to go home.  This RN explained the doctors and entire staff want her to feel better before being discharged.  If not, she may be readmitted.  Patient agreed it would be best if she stayed until the doctors discharged her.  Fair teach back displayed.

## 2018-09-03 NOTE — Progress Notes (Signed)
I will cosign resident's note once it is completed.  Denies new concern. No acute findings on exam. Still awaiting SNF bed. Restart Gabapentin for leg pain. Toe ulcer remains same.

## 2018-09-04 LAB — BASIC METABOLIC PANEL
ANION GAP: 7 (ref 5–15)
BUN: 10 mg/dL (ref 8–23)
CALCIUM: 8.7 mg/dL — AB (ref 8.9–10.3)
CO2: 24 mmol/L (ref 22–32)
Chloride: 110 mmol/L (ref 98–111)
Creatinine, Ser: 0.59 mg/dL (ref 0.44–1.00)
GFR calc Af Amer: >60 mL/min (ref >60)
GFR calc non Af Amer: >60 mL/min (ref >60)
GLUCOSE: 88 mg/dL (ref 70–99)
POTASSIUM: 4.1 mmol/L (ref 3.5–5.1)
SODIUM: 141 mmol/L (ref 135–145)

## 2018-09-04 LAB — TROPONIN I: Troponin I: <0.03 ng/mL (ref <0.03)

## 2018-09-04 MED ORDER — NITROGLYCERIN 0.4 MG SL SUBL
0.4000 mg | SUBLINGUAL_TABLET | SUBLINGUAL | Status: DC | PRN
  Administered 2018-09-04 (×2): 0.4 mg via SUBLINGUAL
  Filled 2018-09-04 (×2): qty 1

## 2018-09-04 NOTE — Progress Notes (Signed)
Family Medicine Teaching Service Daily Progress Note Intern Pager: [phone]  Patient name: Mary Nguyen Medical record number: [id number] Date of birth: 1951-07-07 Age: 67 y.o. Gender: female  Primary Care Provider: Patient, No Pcp Per Consultants: palliative care, nutrition, case management, CSW, PT/OT Code Status: Full code  Pt Overview and Major Events to Date:  Admitted to FPTS on 08/30/18  Assessment and Plan: Preslynn Hamiltonis a 67 y.o.femalepresenting with bradycardia and pneumonia. PMH is significant forunstable angina, CVAx 4, hyperlipidemia, blindness, PAD, CAD, bradycardia, and current smoker.  Pneumonia Stable. Completed course of Levaquin x5 days. Reports improvement in breathing. No shortness of breath today. Currently afebrile.    -s/p levaquin500mg x 5d.(9/24-9/28)  Chest painw/ bradycardia-resolved Possibly 2/2 anxiety. Reports having intermittent pain but currently without any CP. Heart rate wnl at 67. Cards consulted previously during admission and stated to continue imdur, asa, statin, and consider amlodipine or ranexa for continued chest pain. -cardiology consulted, appreciate recommendations   Vertebral fractures New T7, L1 fracture.  Continues to complain of pain.  -scheduled tylenol q6hrs  -lidocaine patch  -vit. D and calcium -start bisphosphonate as outpatient. -PT/OT consulted, appreciate recommendations   Ulcer, toe  2/2 peripheral vascular disease -wound care consulted, appreciate recommendations     Dysuria-resolved Urinalysis showing ketonuria 20, trace leukocytes, no nitrites. S/p levaquin  Malnutrition/cachexia  BMI 13.31.  -mirtazapine 7.5mg  daily at bedtime  -nutrition consulted, appreciate recs -palliative care consulted - outpatient palliative  Diarrheaw/ abdominal pain Improving. Patient still tender to palpation in lower quadrants. 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 S/p levaquin.  -Enteric precautions   -supportive  therapy  Social concerns Concerns of recent weight loss and decreased appetite with concern of home environment (living in [place]) and medication non-compliance despite drug coverage with Medicare Part D. Awaiting SNF placement.  -CSW consulted, appreciate recommendations   Blindness,botheye Chronic, 2/2 stroke in 2002.    Neuropathy  2/2 PAD. Non-compliant with gabapentin since April 2019.  -continue gabapentin at 100 TID, can discharge on lowered dose   FEN/GI: heart healthy  PPx: lovenox   Disposition: awaiting SNF placement  Subjective:  Patient today with continued back pain. Patient does report, however, that other symptoms have improved and other than back pain she is overall doing better. Denies any SOB. States diarrhea has finally "let up".   Objective: Temp:  [97.5 F (36.4 C)-97.9 F (36.6 C)] 97.5 F (36.4 C) (09/28 2324) Pulse Rate:  [56-63] 63 (09/28 2324) Resp:  [18] 18 (09/28 2324) BP: (109-129)/(54-67) 109/54 (09/28 2324) SpO2:  [99 %-100 %] 100 % (09/28 2324) Physical Exam: General: awake and alert, laying in bed, NAD Cardiovascular: RRR, no MRG Respiratory: CTAB, no wheezes, rales, or rhonchi, able to speak full sentences, no increased WOB Abdomen: soft, diffuse tenderness in lower quadrants, non distended, bowel sounds normal  Extremities: no edema, small ulcer on left third toe with no dressings   Laboratory: Recent Labs  Lab 08/30/18 1702 08/31/18 0408 09/01/18 0854  WBC 10.4 10.1 7.6  HGB 18.5* 13.0 12.2  HCT 55.4* 40.5 36.9  PLT 124* 235 206   Recent Labs  Lab 08/30/18 2155  09/01/18 0854 09/03/18 0233 09/04/18 0346  NA  --    < > 142 140 141  K  --    < > 4.2 3.4* 4.1  CL  --    < > 111 107 110  CO2  --    < > 19* 25 24  BUN  --    < > 5*  8 10  CREATININE  --    < > 0.66 0.69 0.59  CALCIUM  --    < > 8.4* 9.0 8.7*  PROT 7.5  --   --   --   --   BILITOT 1.1  --   --   --   --   ALKPHOS 99  --   --   --   --   ALT 10  --    --   --   --   AST 20  --   --   --   --   GLUCOSE  --    < > 89 102* 88   < > = values in this interval not displayed.    Imaging/Diagnostic Tests: Dg Chest 2 View  Result Date: 08/30/2018 CLINICAL DATA:  Chest pain EXAM: CHEST - 2 VIEW COMPARISON:  06/04/2018 chest radiograph. FINDINGS: Stable cardiomediastinal silhouette with normal heart size. No pneumothorax. No pleural effusion. Hyperinflated lungs. No pulmonary edema. There is new bandlike opacity in the right middle lobe. Otherwise no consolidative airspace disease. IMPRESSION: 1. New bandlike opacity in the right middle lobe, which could represent atelectasis and/or pneumonia. Recommend follow-up PA and lateral post treatment chest radiographs in 4-6 weeks. 2. Hyperinflated lungs, suggesting COPD. Electronically Signed   By: Delbert Phenix M.D.   On: 08/30/2018 19:02   Ct Angio Chest Pe W/cm &/or Wo Cm  Result Date: 08/30/2018 CLINICAL DATA:  Abnormal chest radiograph. Dyspnea. Current smoker. Chest pain. Abdominal pain. Diarrhea. EXAM: CT ANGIOGRAPHY CHEST CT ABDOMEN AND PELVIS WITH CONTRAST TECHNIQUE: Multidetector CT imaging of the chest was performed using the standard protocol during bolus administration of intravenous contrast. Multiplanar CT image reconstructions and MIPs were obtained to evaluate the vascular anatomy. Multidetector CT imaging of the abdomen and pelvis was performed using the standard protocol during bolus administration of intravenous contrast. CONTRAST:  ISOVUE-370 IOPAMIDOL (ISOVUE-370) INJECTION 76% COMPARISON:  Chest radiograph from earlier today. 04/01/2018 chest CT angiogram. FINDINGS: CTA CHEST FINDINGS Cardiovascular: The study is high quality for the evaluation of pulmonary embolism. There are no filling defects in the central, lobar, segmental or subsegmental pulmonary artery branches to suggest acute pulmonary embolism. Atherosclerotic nonaneurysmal thoracic aorta. Normal caliber pulmonary arteries.  Normal heart size. No significant pericardial fluid/thickening. Three-vessel coronary atherosclerosis. Mediastinum/Nodes: No discrete thyroid nodules. Unremarkable esophagus. No pathologically enlarged axillary, mediastinal or hilar lymph nodes. Lungs/Pleura: No pneumothorax. No pleural effusion. Mild centrilobular emphysema with diffuse bronchial wall thickening. No lung masses or significant pulmonary nodules. New patchy consolidation with some volume loss throughout the dependent right middle lobe. New patchy tree-in-bud opacity in the basilar right lower lobe. Musculoskeletal: No aggressive appearing focal osseous lesions. Healed deformity in the posterior left tenth rib. Stable chronic moderate T3 vertebral compression fracture. Mild superior T7 vertebral compression fracture is new since 04/01/2018. Moderate T11 vertebral compression fracture is stable. Review of the MIP images confirms the above findings. CT ABDOMEN and PELVIS FINDINGS Hepatobiliary: Normal liver size with Riedel's lobe configuration of the right liver lobe. No liver mass. Normal gallbladder with no radiopaque cholelithiasis. No biliary ductal dilatation. Pancreas: Normal, with no mass or duct dilation. Spleen: Normal size. No mass. Adrenals/Urinary Tract: Normal adrenals. Normal kidneys with no hydronephrosis and no renal mass. Normal nondistended bladder. Stomach/Bowel: Normal non-distended stomach. Normal caliber small bowel with no small bowel wall thickening. Appendix not discretely visualized. No pericecal inflammatory changes. Normal large bowel with no diverticulosis, large bowel wall thickening or pericolonic fat  stranding. Vascular/Lymphatic: Atherosclerotic nonaneurysmal abdominal aorta. 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 Bilateral common iliac arteries appear occluded. Collateral arteries are seen reconstituting the proximal femoral arteries. No pathologically enlarged lymph nodes in the  abdomen or pelvis. Reproductive: Grossly normal uterus.  No adnexal mass. Other: No pneumoperitoneum, ascites or focal fluid collection. Musculoskeletal: No aggressive appearing focal osseous lesions. Moderate to severe L1 vertebral compression fracture is new since 04/01/2018 CT. Mild lumbar spondylosis. Review of the MIP images confirms the above findings. IMPRESSION: 1. New patchy consolidation with some volume loss throughout the dependent right middle lobe with new patchy tree-in-bud opacity in the right lower lobe, compatible with multilobar pneumonia. 2. No pulmonary embolism. 3. Mild superior T7 vertebral compression fracture and moderate to severe L1 vertebral compression fracture, both new since 04/01/2018 CT, possibly acute or subacute. 4. 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 5. Three-vessel coronary atherosclerosis. 6. Aortic Atherosclerosis (ICD10-I70.0) and Emphysema (ICD10-J43.9). Electronically Signed   By: Delbert Phenix M.D.   On: 08/30/2018 21:43   Ct Abdomen Pelvis W Contrast  Result Date: 08/30/2018 CLINICAL DATA:  Abnormal chest radiograph. Dyspnea. Current smoker. Chest pain. Abdominal pain. Diarrhea. EXAM: CT ANGIOGRAPHY CHEST CT ABDOMEN AND PELVIS WITH CONTRAST TECHNIQUE: Multidetector CT imaging of the chest was performed using the standard protocol during bolus administration of intravenous contrast. Multiplanar CT image reconstructions and MIPs were obtained to evaluate the vascular anatomy. Multidetector CT imaging of the abdomen and pelvis was performed using the standard protocol during bolus administration of intravenous contrast. CONTRAST:  ISOVUE-370 IOPAMIDOL (ISOVUE-370) INJECTION 76% COMPARISON:  Chest radiograph from earlier today. 04/01/2018 chest CT angiogram. FINDINGS: CTA CHEST FINDINGS Cardiovascular: The study is high quality for the evaluation of  pulmonary embolism. There are no filling defects in the central, lobar, segmental or subsegmental pulmonary artery branches to suggest acute pulmonary embolism. Atherosclerotic nonaneurysmal thoracic aorta. Normal caliber pulmonary arteries. Normal heart size. No significant pericardial fluid/thickening. Three-vessel coronary atherosclerosis. Mediastinum/Nodes: No discrete thyroid nodules. Unremarkable esophagus. No pathologically enlarged axillary, mediastinal or hilar lymph nodes. Lungs/Pleura: No pneumothorax. No pleural effusion. Mild centrilobular emphysema with diffuse bronchial wall thickening. No lung masses or significant pulmonary nodules. New patchy consolidation with some volume loss throughout the dependent right middle lobe. New patchy tree-in-bud opacity in the basilar right lower lobe. Musculoskeletal: No aggressive appearing focal osseous lesions. Healed deformity in the posterior left tenth rib. Stable chronic moderate T3 vertebral compression fracture. Mild superior T7 vertebral compression fracture is new since 04/01/2018. Moderate T11 vertebral compression fracture is stable. Review of the MIP images confirms the above findings. CT ABDOMEN and PELVIS FINDINGS Hepatobiliary: Normal liver size with Riedel's lobe configuration of the right liver lobe. No liver mass. Normal gallbladder with no radiopaque cholelithiasis. No biliary ductal dilatation. Pancreas: Normal, with no mass or duct dilation. Spleen: Normal size. No mass. Adrenals/Urinary Tract: Normal adrenals. Normal kidneys with no hydronephrosis and no renal mass. Normal nondistended bladder. Stomach/Bowel: Normal non-distended stomach. Normal caliber small bowel with no small bowel wall thickening. Appendix not discretely visualized. No pericecal inflammatory changes. Normal large bowel with no diverticulosis, large bowel wall thickening or pericolonic fat stranding. Vascular/Lymphatic: Atherosclerotic nonaneurysmal abdominal aorta.  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 Bilateral common iliac arteries appear occluded. Collateral arteries are seen reconstituting the proximal femoral arteries. No pathologically enlarged lymph nodes in the  abdomen or pelvis. Reproductive: Grossly normal uterus.  No adnexal mass. Other: No pneumoperitoneum, ascites or focal fluid collection. Musculoskeletal: No aggressive appearing focal osseous lesions. Moderate to severe L1 vertebral compression fracture is new since 04/01/2018 CT. Mild lumbar spondylosis. Review of the MIP images confirms the above findings. IMPRESSION: 1. New patchy consolidation with some volume loss throughout the dependent right middle lobe with new patchy tree-in-bud opacity in the right lower lobe, compatible with multilobar pneumonia. 2. No pulmonary embolism. 3. Mild superior T7 vertebral compression fracture and moderate to severe L1 vertebral compression fracture, both new since 04/01/2018 CT, possibly acute or subacute. 4. 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 5. Three-vessel coronary atherosclerosis. 6. Aortic Atherosclerosis (ICD10-I70.0) and Emphysema (ICD10-J43.9). Electronically Signed   By: Delbert Phenix M.D.   On: 08/30/2018 21:43    Oralia Manis, DO 09/04/2018, 6:13 AM PGY-2, [facility] FPTS Intern pager: [phone], text pages welcome

## 2018-09-04 NOTE — Progress Notes (Signed)
FPTS Interim Progress Note  S: Paged by nurse at 1658 and told that patient had been experiencing chest pain that started at 1619.  Nurse reports no acute changes in vitals, no changes on telemetry.  At bedside, patient reports chest pain that is dull and just left to sternum that radiates to her left arm and into her back.  She reports that the last time she had this pain was a few months ago.  She has not experienced it since.  She was just laying in bed when it occurred.  She continues to still have some dull aching pain in her chest.  Patient denies shortness of breath, nausea, vomiting.  O: BP (!) 96/58   Pulse 67   Temp 98.1 F (36.7 C) (Oral)   Resp 12   Ht 5\' 2"  (1.575 m)   Wt 33.5 kg   SpO2 95%   BMI 13.51 kg/m   General: Patient laying comfortably in bed Chest: No tenderness to palpation.  Heart sounds are far and faint. Irregular rate and rhythm.  2 out of 6 diastolic murmur appreciated.  Bilateral radial pulses 1+. Lungs: CTAB, no wheezes rales or rhonchi appreciated. Abdomen: soft with positive bowel sounds  A/P: Chest Pain:   Troponins x3  Stat EKG  Trial nitroglycerin as needed for pain relief  We will follow-up with nurse if patient responded to nitro  Melene Plan, MD 09/04/2018, 5:15 PM PGY-1, [facility] Family Medicine Service pager [phone]

## 2018-09-04 NOTE — Progress Notes (Signed)
  Speech Language Pathology Treatment: Dysphagia  Patient Details Name: Mary Nguyen MRN: [id number] DOB: 10/08/1951 Today's Date: 09/04/2018 Time: 9753-0051 SLP Time Calculation (min) (ACUTE ONLY): 12 min  Assessment / Plan / Recommendation Clinical Impression  Pt continues to c/o globus sensation with po intake.  Pt reports difficulty with dinner last night, but no problems this morning with breakfast.  RN reports no difficulty with medications and no reports of any clinical s/s of aspiration from NT with breakfast.  Pt would not tolerate HoB greater than 30 degrees during this meal.  Pt reports upright positioning places pressure on her stomach and increases swallowing difficulty.  Provided education regarding aspiration and esophageal precautions.  Pt exhibited good oral clearance of solids.  Pt required multiple swallows with thin liquid.  Pt endorsed mild globus sensation.  Pt c/o bad reflux for which she had previously taken prilosec.  Encouraged pt to speak with doctors about medication for reflux.  Pt may benefit from GI consult to assess esophageal dysphagia/dysmotility. Recommend continuing regular texture diet with thin liquid.  Pt has no further ST needs at this time.   HPI HPI: Pt is a 67 y/o female admitted secondary to chest pain. Imaging revealed likely R middle and lower lobe PNA, and showed new T7-L1 compression fxs. Imaging negative for PE. PMH includes blindness, CVA, PAD, CAD, and current smoker.  No new chest imaging available since 08/30/18      SLP Plan  All goals met;Discharge SLP treatment due to (comment)       Recommendations  Diet recommendations: Regular;Thin liquid Liquids provided via: Straw;Cup Medication Administration: Whole meds with liquid(crush if needed) Supervision: Staff to assist with self feeding Compensations: Slow rate;Small sips/bites;Follow solids with liquid Postural Changes and/or Swallow Maneuvers: Seated upright 90 degrees;Upright 30-60  min after meal                General recommendations: (Consider GI referral to assess esophageal dysphagia/dysmotility) Oral Care Recommendations: Oral care BID Follow up Recommendations: Skilled Nursing facility SLP Visit Diagnosis: Dysphagia, pharyngoesophageal phase (R13.14) Plan: All goals met;Discharge SLP treatment due to (comment)       [place], [place], [place] Office: [phone] 09/04/2018, 11:15 AM

## 2018-09-05 LAB — BASIC METABOLIC PANEL
Anion gap: 6 (ref 5–15)
BUN: 11 mg/dL (ref 8–23)
CALCIUM: 8.8 mg/dL — AB (ref 8.9–10.3)
CO2: 27 mmol/L (ref 22–32)
CREATININE: 0.7 mg/dL (ref 0.44–1.00)
Chloride: 107 mmol/L (ref 98–111)
Glucose, Bld: 78 mg/dL (ref 70–99)
Potassium: 4.2 mmol/L (ref 3.5–5.1)
SODIUM: 140 mmol/L (ref 135–145)

## 2018-09-05 LAB — TROPONIN I

## 2018-09-05 MED ORDER — GABAPENTIN 300 MG PO CAPS
300.0000 mg | ORAL_CAPSULE | Freq: Three times a day (TID) | ORAL | Status: DC
  Administered 2018-09-05 – 2018-09-07 (×7): 300 mg via ORAL
  Filled 2018-09-05 (×7): qty 1
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
## 2018-09-05 NOTE — Progress Notes (Signed)
Family Medicine Teaching Service Daily Progress Note Intern Pager: [phone]  Patient name: Mary Nguyen Medical record number: [id number] Date of birth: Jun 27, 1951 Age: 67 y.o. Gender: female  Primary Care Provider: Patient, No Pcp Per Consultants: palliative care, nutrition, case management, CSW, PT/OT Code Status: Full code  Pt Overview and Major Events to Date:  Admitted to FPTS on 08/30/18  Assessment and Plan: Mary Nguyen a 67 y.o.femalepresenting withbradycardiaand pneumonia. PMH is significant forunstable angina, CVAx 4, hyperlipidemia, blindness, PAD, CAD, bradycardia, and current smoker.  Pneumonia: Stable. Completed course of Levaquin x5 days. Reports improvement in breathing. No shortness of breath today. Currently afebrile.   -monitor   Chest painw/ bradycardia, stable: Reports having intermittent pain. Left Chest tender to palpation. HR drops to as low as 45 but patient asymptomatic. Cards consulted previously during admission and stated to continue imdur, asa, statin, and consider amlodipine or ranexa for continued chest pain. -cardiology consulted, appreciate recommendations   Vertebral fractures, stable: New T7, L1 fracture. Mild, intermittent pain.   -scheduled tylenol q6hrs  -lidocaine patch  -vit. D and calcium -start bisphosphonate as outpatient. -PT/OT consulted, appreciate recommendations   Ulcer, L 2nd toe: 2/2 peripheral vascular disease, improved with gabapentin - Gabapentin 300 TID - wound care consulted, appreciate recommendations    Dysuria, resolved: Urinalysis showing ketonuria 20, trace leukocytes, no nitrites. S/p levaquin - Monitor  Malnutrition/cachexia: BMI 13.31.  -mirtazapine 7.5mg  daily at bedtime  -nutrition consulted, appreciate recs -palliative care consulted - outpatient palliative  Diarrheaw/ abdominal pain, improved: Patient abdominal tenderness to palpation improved, no diarrhea since 9/27. EAEC found  on GI panel. S/p levaquin.  -Enteric precautions   -supportive therapy  Social concerns: Concerns of recent weight loss and decreased appetite with concern of home environment (living in [place]) and medication non-compliance despite drug coverage with Medicare Part D. Awaiting SNF placement.  -CSW consulted, appreciate recommendations   Blindness,botheye, chronic, stable: 2/2 stroke in 2002.    Neuropathy: 2/2 PAD. Non-compliant with gabapentin since April 2019.  -continue gabapentin at 300 TID, can discharge on lowered dose   FEN/GI: heart healthy  PPx: lovenox   Disposition: awaiting SNF placement  Subjective:  Patient seen this morning resting in bed. She states she is doing ok but feels like "she is falling apart". She is concerned for her PVD and the problems this could cause her now and in the future. Otherwise she is in good spirits and ready to leave for a SNF. She believes a SNF will be a much better situation for her than her previous living space. She has tenderness to palpation of the left chest and has a heating pad in place on the left should for MSK pain. Otherwise she denies shortness of breath, cough, abdominal pain, nausea, vomiting, and diarrhea.   Objective: Temp:  [97.4 F (36.3 C)-98.3 F (36.8 C)] 97.4 F (36.3 C) (09/30 0814) Pulse Rate:  [56-67] 56 (09/30 0814) Resp:  [12-16] 16 (09/29 2358) BP: (96-145)/(57-59) 145/59 (09/30 0814) SpO2:  [95 %-100 %] 99 % (09/30 0814)   Physical Exam  Constitutional: She is oriented to person, place, and time. She appears well-developed and well-nourished.  HENT:  Head: Normocephalic.  Eyes: Pupils are equal, round, and reactive to light.  Neck: Normal range of motion.  Cardiovascular: Regular rhythm and normal pulses. Bradycardia present.  Abdominal: Soft. Bowel sounds are normal.  Musculoskeletal: Normal range of motion.  Hammer toes of digits 2-5 bilaterally, Left 2nd toe with ventral ulcer, clean, healing   Neurological:  She is alert and oriented to person, place, and time.  Skin: Skin is dry. Capillary refill takes less than 2 seconds. There is pallor.  Psychiatric: She has a normal mood and affect.   Laboratory: Recent Labs  Lab 08/30/18 1702 08/31/18 0408 09/01/18 0854  WBC 10.4 10.1 7.6  HGB 18.5* 13.0 12.2  HCT 55.4* 40.5 36.9  PLT 124* 235 206   Recent Labs  Lab 08/30/18 2155  09/03/18 0233 09/04/18 0346 09/05/18 0814  NA  --    < > 140 141 140  K  --    < > 3.4* 4.1 4.2  CL  --    < > 107 110 107  CO2  --    < > 25 24 27   BUN  --    < > 8 10 11   CREATININE  --    < > 0.69 0.59 0.70  CALCIUM  --    < > 9.0 8.7* 8.8*  PROT 7.5  --   --   --   --   BILITOT 1.1  --   --   --   --   ALKPHOS 99  --   --   --   --   ALT 10  --   --   --   --   AST 20  --   --   --   --   GLUCOSE  --    < > 102* 88 78   < > = values in this interval not displayed.   Imaging/Diagnostic Tests: Dg Chest 2 View  Result Date: 08/30/2018 CLINICAL DATA:  Chest pain EXAM: CHEST - 2 VIEW COMPARISON:  06/04/2018 chest radiograph. FINDINGS: Stable cardiomediastinal silhouette with normal heart size. No pneumothorax. No pleural effusion. Hyperinflated lungs. No pulmonary edema. There is new bandlike opacity in the right middle lobe. Otherwise no consolidative airspace disease. IMPRESSION: 1. New bandlike opacity in the right middle lobe, which could represent atelectasis and/or pneumonia. Recommend follow-up PA and lateral post treatment chest radiographs in 4-6 weeks. 2. Hyperinflated lungs, suggesting COPD. Electronically Signed   By: Delbert Phenix M.D.   On: 08/30/2018 19:02   Ct Angio Chest Pe W/cm &/or Wo Cm  Result Date: 08/30/2018 CLINICAL DATA:  Abnormal chest radiograph. Dyspnea. Current smoker. Chest pain. Abdominal pain. Diarrhea. EXAM: CT ANGIOGRAPHY CHEST CT ABDOMEN AND PELVIS WITH CONTRAST TECHNIQUE: Multidetector CT imaging of the chest was performed using the standard protocol during bolus  administration of intravenous contrast. Multiplanar CT image reconstructions and MIPs were obtained to evaluate the vascular anatomy. Multidetector CT imaging of the abdomen and pelvis was performed using the standard protocol during bolus administration of intravenous contrast. CONTRAST:  ISOVUE-370 IOPAMIDOL (ISOVUE-370) INJECTION 76% COMPARISON:  Chest radiograph from earlier today. 04/01/2018 chest CT angiogram. FINDINGS: CTA CHEST FINDINGS Cardiovascular: The study is high quality for the evaluation of pulmonary embolism. There are no filling defects in the central, lobar, segmental or subsegmental pulmonary artery branches to suggest acute pulmonary embolism. Atherosclerotic nonaneurysmal thoracic aorta. Normal caliber pulmonary arteries. Normal heart size. No significant pericardial fluid/thickening. Three-vessel coronary atherosclerosis. Mediastinum/Nodes: No discrete thyroid nodules. Unremarkable esophagus. No pathologically enlarged axillary, mediastinal or hilar lymph nodes. Lungs/Pleura: No pneumothorax. No pleural effusion. Mild centrilobular emphysema with diffuse bronchial wall thickening. No lung masses or significant pulmonary nodules. New patchy consolidation with some volume loss throughout the dependent right middle lobe. New patchy tree-in-bud opacity in the basilar right lower lobe. Musculoskeletal: No aggressive appearing focal  osseous lesions. Healed deformity in the posterior left tenth rib. Stable chronic moderate T3 vertebral compression fracture. Mild superior T7 vertebral compression fracture is new since 04/01/2018. Moderate T11 vertebral compression fracture is stable. Review of the MIP images confirms the above findings. CT ABDOMEN and PELVIS FINDINGS Hepatobiliary: Normal liver size with Riedel's lobe configuration of the right liver lobe. No liver mass. Normal gallbladder with no radiopaque cholelithiasis. No biliary ductal dilatation. Pancreas: Normal, with no mass or duct  dilation. Spleen: Normal size. No mass. Adrenals/Urinary Tract: Normal adrenals. Normal kidneys with no hydronephrosis and no renal mass. Normal nondistended bladder. Stomach/Bowel: Normal non-distended stomach. Normal caliber small bowel with no small bowel wall thickening. Appendix not discretely visualized. No pericecal inflammatory changes. Normal large bowel with no diverticulosis, large bowel wall thickening or pericolonic fat stranding. Vascular/Lymphatic: Atherosclerotic nonaneurysmal abdominal aorta. 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 Bilateral common iliac arteries appear occluded. Collateral arteries are seen reconstituting the proximal femoral arteries. No pathologically enlarged lymph nodes in the abdomen or pelvis. Reproductive: Grossly normal uterus.  No adnexal mass. Other: No pneumoperitoneum, ascites or focal fluid collection. Musculoskeletal: No aggressive appearing focal osseous lesions. Moderate to severe L1 vertebral compression fracture is new since 04/01/2018 CT. Mild lumbar spondylosis. Review of the MIP images confirms the above findings. IMPRESSION: 1. New patchy consolidation with some volume loss throughout the dependent right middle lobe with new patchy tree-in-bud opacity in the right lower lobe, compatible with multilobar pneumonia. 2. No pulmonary embolism. 3. Mild superior T7 vertebral compression fracture and moderate to severe L1 vertebral compression fracture, both new since 04/01/2018 CT, possibly acute or subacute. 4. 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 5. Three-vessel coronary atherosclerosis. 6. Aortic Atherosclerosis (ICD10-I70.0) and Emphysema (ICD10-J43.9). Electronically Signed   By: Delbert Phenix M.D.   On: 08/30/2018 21:43   Ct Abdomen Pelvis W Contrast  Result Date: 08/30/2018 CLINICAL DATA:  Abnormal chest  radiograph. Dyspnea. Current smoker. Chest pain. Abdominal pain. Diarrhea. EXAM: CT ANGIOGRAPHY CHEST CT ABDOMEN AND PELVIS WITH CONTRAST TECHNIQUE: Multidetector CT imaging of the chest was performed using the standard protocol during bolus administration of intravenous contrast. Multiplanar CT image reconstructions and MIPs were obtained to evaluate the vascular anatomy. Multidetector CT imaging of the abdomen and pelvis was performed using the standard protocol during bolus administration of intravenous contrast. CONTRAST:  ISOVUE-370 IOPAMIDOL (ISOVUE-370) INJECTION 76% COMPARISON:  Chest radiograph from earlier today. 04/01/2018 chest CT angiogram. FINDINGS: CTA CHEST FINDINGS Cardiovascular: The study is high quality for the evaluation of pulmonary embolism. There are no filling defects in the central, lobar, segmental or subsegmental pulmonary artery branches to suggest acute pulmonary embolism. Atherosclerotic nonaneurysmal thoracic aorta. Normal caliber pulmonary arteries. Normal heart size. No significant pericardial fluid/thickening. Three-vessel coronary atherosclerosis. Mediastinum/Nodes: No discrete thyroid nodules. Unremarkable esophagus. No pathologically enlarged axillary, mediastinal or hilar lymph nodes. Lungs/Pleura: No pneumothorax. No pleural effusion. Mild centrilobular emphysema with diffuse bronchial wall thickening. No lung masses or significant pulmonary nodules. New patchy consolidation with some volume loss throughout the dependent right middle lobe. New patchy tree-in-bud opacity in the basilar right lower lobe. Musculoskeletal: No aggressive appearing focal osseous lesions. Healed deformity in the posterior left tenth rib. Stable chronic moderate T3 vertebral compression fracture. Mild superior T7 vertebral compression fracture is new since 04/01/2018. Moderate T11 vertebral compression fracture is stable. Review of the MIP images confirms the  above findings. CT ABDOMEN and  PELVIS FINDINGS Hepatobiliary: Normal liver size with Riedel's lobe configuration of the right liver lobe. No liver mass. Normal gallbladder with no radiopaque cholelithiasis. No biliary ductal dilatation. Pancreas: Normal, with no mass or duct dilation. Spleen: Normal size. No mass. Adrenals/Urinary Tract: Normal adrenals. Normal kidneys with no hydronephrosis and no renal mass. Normal nondistended bladder. Stomach/Bowel: Normal non-distended stomach. Normal caliber small bowel with no small bowel wall thickening. Appendix not discretely visualized. No pericecal inflammatory changes. Normal large bowel with no diverticulosis, large bowel wall thickening or pericolonic fat stranding. Vascular/Lymphatic: Atherosclerotic nonaneurysmal abdominal aorta. 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 Bilateral common iliac arteries appear occluded. Collateral arteries are seen reconstituting the proximal femoral arteries. No pathologically enlarged lymph nodes in the abdomen or pelvis. Reproductive: Grossly normal uterus.  No adnexal mass. Other: No pneumoperitoneum, ascites or focal fluid collection. Musculoskeletal: No aggressive appearing focal osseous lesions. Moderate to severe L1 vertebral compression fracture is new since 04/01/2018 CT. Mild lumbar spondylosis. Review of the MIP images confirms the above findings. IMPRESSION: 1. New patchy consolidation with some volume loss throughout the dependent right middle lobe with new patchy tree-in-bud opacity in the right lower lobe, compatible with multilobar pneumonia. 2. No pulmonary embolism. 3. Mild superior T7 vertebral compression fracture and moderate to severe L1 vertebral compression fracture, both new since 04/01/2018 CT, possibly acute or subacute. 4. 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 5. Three-vessel coronary  atherosclerosis. 6. Aortic Atherosclerosis (ICD10-I70.0) and Emphysema (ICD10-J43.9). Electronically Signed   By: Delbert Phenix M.D.   On: 08/30/2018 21:43    Dollene Cleveland, DO 09/05/2018, 9:52 AM PGY-1, [facility] FPTS Intern pager: [phone], text pages welcome
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
## 2018-09-05 NOTE — Progress Notes (Signed)
RN received call from tele that pt's heart rate staying in the 40's. RN assessed pt. She was alert and oriented and denied shortness of breath or chest pain. RN reported heart rate and findings to MD to follow up on.

## 2018-09-05 NOTE — Progress Notes (Signed)
Physical Therapy Treatment Patient Details Name: Mary Nguyen MRN: [id number] DOB: 12-27-1950 Today's Date: 09/05/2018    History of Present Illness Pt is a 67 y/o female admitted secondary to chest pain. Imaging revealed likely R middle and lower lobe PNA, and showed new T7-L1 compression fxs. Imaging negative for PE. PMH includes blindness, CVA, PAD, CAD, and current smoker.     PT Comments    Pt reports feeling very depressed, as she can't take care of herself like she would like. Pt is frequently incontinent and she reports " I hate being wet and it takes so long for someone to come clean me up." Pt willing to work with therapy despite increased pain in L shoulder. In sitting, PT performed some trigger point release of trigger points in L trapezius. Pt requires min A for bed mobility, transfers and ambulation with PT in front of her with 2 hand assist. Pt left in bed with heat pack on L shoulder to aid in pain reduction. D/c plans remain appropriate at this time.      Follow Up Recommendations  SNF;Supervision/Assistance - 24 hour     Equipment Recommendations  Other (comment)(TBD at next venue)    Recommendations for Other Services       Precautions / Restrictions Precautions Precautions: Fall;Back Precaution Booklet Issued: No Precaution Comments: Reviewed back precautions with pt Restrictions Weight Bearing Restrictions: No    Mobility  Bed Mobility Overal bed mobility: Needs Assistance   Rolling: Min assist Sidelying to sit: Min assist     Sit to sidelying: Min assist General bed mobility comments: min A and vc for rolling, min A for pulling up on therapist to upright, minA for management of LE onto bed  Transfers Overall transfer level: Needs assistance Equipment used: 1 person hand held assist Transfers: Sit to/from Stand Sit to Stand: Min assist         General transfer comment: minA for powerup and steadying  Ambulation/Gait Ambulation/Gait  assistance: Min assist Gait Distance (Feet): 20 Feet Assistive device: 1 person hand held assist Gait Pattern/deviations: Step-through pattern;Shuffle;Narrow base of support Gait velocity: slowed Gait velocity interpretation: <1.31 ft/sec, indicative of household ambulator General Gait Details: minA for steadying, initially ambulated with therapist on L side with arm around back at the waist, pt with increased instability and decreased step length. pt reports she ambulates better with person in front of her holding her hands, pt requires much less assist and is much more stable        Balance Overall balance assessment: Needs assistance Sitting-balance support: Single extremity supported;Feet supported Sitting balance-Leahy Scale: Fair     Standing balance support: Bilateral upper extremity supported Standing balance-Leahy Scale: Poor Standing balance comment: Reliant on BUE support and external support for balance.                             Cognition Arousal/Alertness: Awake/alert Behavior During Therapy: WFL for tasks assessed/performed Overall Cognitive Status: No family/caregiver present to determine baseline cognitive functioning                                           General Comments General comments (skin integrity, edema, etc.): at rest HR 42 bpm, with ambulation HR 52 bpm, with supine back in bed 42 bpm      Pertinent Vitals/Pain Pain Assessment: 0-10  Pain Score: 8  Pain Location: L shoulder and neck 8/10 Pain Descriptors / Indicators: Guarding;Grimacing Pain Intervention(s): Limited activity within patient's tolerance;Monitored during session;Heat applied;Repositioned           PT Goals (current goals can now be found in the care plan section) Acute Rehab PT Goals Patient Stated Goal: Get better PT Goal Formulation: With patient Time For Goal Achievement: 09/14/18 Potential to Achieve Goals: Fair Progress towards PT goals:  Progressing toward goals    Frequency    Min 2X/week      PT Plan Current plan remains appropriate       AM-PAC PT "6 Clicks" Daily Activity  Outcome Measure  Difficulty turning over in bed (including adjusting bedclothes, sheets and blankets)?: Unable Difficulty moving from lying on back to sitting on the side of the bed? : Unable Difficulty sitting down on and standing up from a chair with arms (e.g., wheelchair, bedside commode, etc,.)?: Unable Help needed moving to and from a bed to chair (including a wheelchair)?: Total Help needed walking in hospital room?: Total Help needed climbing 3-5 steps with a railing? : Total 6 Click Score: 6    End of Session Equipment Utilized During Treatment: Gait belt Activity Tolerance: Patient limited by fatigue Patient left: in bed;with call bell/phone within reach Nurse Communication: Mobility status PT Visit Diagnosis: Unsteadiness on feet (R26.81);Muscle weakness (generalized) (M62.81);History of falling (Z91.81);Pain Pain - part of body: Shoulder(back )     Time: 6962-9528 PT Time Calculation (min) (ACUTE ONLY): 20 min  Charges:  $Gait Training: 8-22 mins                     Minh Jasper B. Beverely Risen PT, DPT Acute Rehabilitation Services Pager [phone] Office [phone]    Elon Alas Fleet 09/05/2018, 12:26 PM

## 2018-09-05 NOTE — Progress Notes (Addendum)
She is doing well today without acute findings. Endorsed chest pain which started last night and has improved. Chest pain is reproducible. Troponin neg so far. Tylenol as needed for pain. Monitor closely.  Minimal improvement of toe/feet pain on Gabapentin 100 mg BID. We will go to her home dose of 300 mg TID. Still awaiting SNF bed. I will cosign resident's note once it is completed.
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
## 2018-09-06 NOTE — Progress Notes (Signed)
APS report complete. CSW provided with DSS Medicaid LTC worker, Leretha Pol. CSW left him a voicemail to check on patient's status.   Osborne Casco Porshia Blizzard LCSW [phone]

## 2018-09-06 NOTE — Progress Notes (Signed)
[facility] requesting APS worker contact information to see if patient has disability before they will accept patient. CSW left voicemail for APS.   Osborne Casco Oris Calmes LCSW [phone]

## 2018-09-07 LAB — CBC
HEMATOCRIT: 41.9 % (ref 36.0–46.0)
HEMOGLOBIN: 13 g/dL (ref 12.0–15.0)
MCH: 31.8 pg (ref 26.0–34.0)
MCHC: 31 g/dL (ref 30.0–36.0)
MCV: 102.4 fL — AB (ref 78.0–100.0)
Platelets: 295 10*3/uL (ref 150–400)
RBC: 4.09 MIL/uL (ref 3.87–5.11)
RDW: 14.3 % (ref 11.5–15.5)
WBC: 10.4 10*3/uL (ref 4.0–10.5)

## 2018-09-07 LAB — CREATININE, SERUM
Creatinine, Ser: 0.85 mg/dL (ref 0.44–1.00)
GFR calc Af Amer: >60 mL/min (ref >60)
GFR calc non Af Amer: >60 mL/min (ref >60)

## 2018-09-07 MED ORDER — POLYETHYLENE GLYCOL 3350 17 G PO PACK: 17.0000 g | PACK | Freq: Every day | ORAL | 0 refills | Status: AC | PRN

## 2018-09-07 MED ORDER — PANTOPRAZOLE SODIUM 20 MG PO TBEC
20.0000 mg | DELAYED_RELEASE_TABLET | Freq: Every day | ORAL | Status: DC
  Administered 2018-09-07: 20 mg via ORAL
  Filled 2018-09-07: qty 1

## 2018-09-07 MED ORDER — ACETAMINOPHEN 325 MG PO TABS: 650.0000 mg | ORAL_TABLET | Freq: Four times a day (QID) | ORAL | Status: AC

## 2018-09-07 MED ORDER — ASPIRIN 81 MG PO TBEC: 81.0000 mg | DELAYED_RELEASE_TABLET | Freq: Every day | ORAL | 0 refills | Status: AC

## 2018-09-07 MED ORDER — POLYETHYLENE GLYCOL 3350 17 G PO PACK
17.0000 g | PACK | Freq: Every day | ORAL | Status: DC
  Administered 2018-09-07: 17 g via ORAL
  Filled 2018-09-07: qty 1

## 2018-09-07 MED ORDER — PANTOPRAZOLE SODIUM 20 MG PO TBEC: 20.0000 mg | DELAYED_RELEASE_TABLET | Freq: Every day | ORAL | 0 refills | Status: AC

## 2018-09-07 MED ORDER — CALCIUM CARBONATE-VITAMIN D 500-200 MG-UNIT PO TABS: 1.0000 | ORAL_TABLET | Freq: Every day | ORAL | 0 refills | Status: AC

## 2018-09-07 MED ORDER — MIRTAZAPINE 7.5 MG PO TABS: 7.5000 mg | ORAL_TABLET | Freq: Every day | ORAL | 0 refills | Status: AC

## 2018-09-07 MED ORDER — NITROGLYCERIN 0.4 MG SL SUBL: 0.4000 mg | SUBLINGUAL_TABLET | SUBLINGUAL | 0 refills | Status: AC | PRN

## 2018-09-07 MED ORDER — ISOSORBIDE MONONITRATE ER 30 MG PO TB24: 30.0000 mg | ORAL_TABLET | Freq: Every day | ORAL | 0 refills | Status: AC

## 2018-09-07 MED ORDER — PANTOPRAZOLE SODIUM 40 MG PO TBEC: 40.0000 mg | DELAYED_RELEASE_TABLET | Freq: Every day | ORAL | Status: DC

## 2018-09-07 NOTE — Progress Notes (Addendum)
Patient will DC to: [facility] Anticipated DC date: 09/07/18 Family notified: Patient asked CSW to contact her son to let him know. CSW attempted the number listed on the chart, however it went straight to a message that stated "voicemail has not been set up yet". Transport by: Audie Clear   Per MD patient ready for DC to [facility]. RN, patient, patient's family, and facility notified of DC. Discharge Summary sent to facility. RN please call report [phone]). DC packet on chart. Ambulance transport requested for patient.   CSW signing off.  Cristobal Goldmann, LCSW Clinical Social Worker [phone]

## 2018-09-07 NOTE — Progress Notes (Signed)
Pt discharged to SNF via stretcher/ambulance transport.  VS stable, PIV removed and belongings were sent with medical transport.

## 2018-09-07 NOTE — Discharge Instructions (Signed)
You were admitted for pneumonia, which was treated with antibiotics and improved.  You were also found to have a bacteria in your stool that has likely caused your diarrhea.    You should follow up with your primary doctor in 1 week.    Community-Acquired Pneumonia, Adult Pneumonia is an infection of the lungs. One type of pneumonia can happen while a person is in a hospital. A different type can happen when a person is not in a hospital (community-acquired pneumonia). It is easy for this kind to spread from person to person. It can spread to you if you breathe near an infected person who coughs or sneezes. Some symptoms include:  A dry cough.  A wet (productive) cough.  Fever.  Sweating.  Chest pain.  Follow these instructions at home:  Take over-the-counter and prescription medicines only as told by your doctor. ? Only take cough medicine if you are losing sleep. ? If you were prescribed an antibiotic medicine, take it as told by your doctor. Do not stop taking the antibiotic even if you start to feel better.  Sleep with your head and neck raised (elevated). You can do this by putting a few pillows under your head, or you can sleep in a recliner.  Do not use tobacco products. These include cigarettes, chewing tobacco, and e-cigarettes. If you need help quitting, ask your doctor.  Drink enough water to keep your pee (urine) clear or pale yellow. A shot (vaccine) can help prevent pneumonia. Shots are often suggested for:  People older than 66 years of age.  People older than 66 years of age: ? Who are having cancer treatment. ? Who have long-term (chronic) lung disease. ? Who have problems with their body's defense system (immune system).  You may also prevent pneumonia if you take these actions:  Get the flu (influenza) shot every year.  Go to the dentist as often as told.  Wash your hands often. If soap and water are not available, use hand sanitizer.  Contact a  doctor if:  You have a fever.  You lose sleep because your cough medicine does not help. Get help right away if:  You are short of breath and it gets worse.  You have more chest pain.  Your sickness gets worse. This is very serious if: ? You are an older adult. ? Your body's defense system is weak.  You cough up blood. This information is not intended to replace advice given to you by your health care provider. Make sure you discuss any questions you have with your health care provider. Document Released: 05/11/2008 Document Revised: 04/30/2016 Document Reviewed: 03/20/2015 Elsevier Interactive Patient Education  [employer].

## 2018-09-07 NOTE — Progress Notes (Signed)
[facility] unable to accept patient without Medicaid in place. [facility] able to accept patient today and will assist with Medicaid. They are able to complete verbal consents with patient since patient is blind and she will not ask her family to sign her in. Patient accepting of facility, though she reports that she likes the hospital. MD aware.   Osborne Casco Kenniyah Sasaki LCSW [phone]

## 2018-09-07 NOTE — Care Management Important Message (Signed)
Important Message  Patient Details  Name: Mary Nguyen MRN: [id number] Date of Birth: 1951/11/02   Medicare Important Message Given:  Yes    Leone Haven, RN 09/07/2018, 3:06 PM

## 2018-09-07 NOTE — Clinical Social Work Placement (Signed)
   CLINICAL SOCIAL WORK PLACEMENT  NOTE  Date:  09/07/2018  Patient Details  Name: Mary Nguyen MRN: [id number] Date of Birth: May 26, 1951  Clinical Social Work is seeking post-discharge placement for this patient at the Skilled  Nursing Facility level of care (*CSW will initial, date and re-position this form in  chart as items are completed):  Yes   Patient/family provided with [facility] Clinical Social Work Department's list of facilities offering this level of care within the geographic area requested by the patient (or if unable, by the patient's family).  Yes   Patient/family informed of their freedom to choose among providers that offer the needed level of care, that participate in Medicare, Medicaid or managed care program needed by the patient, have an available bed and are willing to accept the patient.  Yes   Patient/family informed of [facility]'s ownership interest in [facility] and [facility], as well as of the fact that they are under no obligation to receive care at these facilities.  PASRR submitted to EDS on 09/05/18     PASRR number received on 09/05/18     Existing PASRR number confirmed on       FL2 transmitted to all facilities in geographic area requested by pt/family on 09/05/18     FL2 transmitted to all facilities within larger geographic area on       Patient informed that his/her managed care company has contracts with or will negotiate with certain facilities, including the following:        Yes   Patient/family informed of bed offers received.  Patient chooses bed at [facility]     Physician recommends and patient chooses bed at      Patient to be transferred to [facility] on 09/07/18.  Patient to be transferred to facility by [facility]     Patient family notified on 09/07/18 of transfer.  Name of family member notified:  Patient reports that she will alert her son     PHYSICIAN Please prepare priority discharge summary, including  medications     Additional Comment:    _______________________________________________ Mearl Latin, LCSW 09/07/2018, 2:13 PM

## 2018-09-07 NOTE — Progress Notes (Signed)
Occupational Therapy Treatment Patient Details Name: Mary Nguyen MRN: [id number] DOB: [birth year] Today's Date: 09/07/2018    History of present illness Pt is a 67 y/o female admitted secondary to chest pain. Imaging revealed likely R middle and lower lobe PNA, and showed new T7-L1 compression fxs. Imaging negative for PE. PMH includes blindness, CVA, PAD, CAD, and current smoker.    OT comments  Pt was seen for ADL retraining session today with focus on bed mobility, back precautions, sitting EOB and standing during bathing and toileting using 3:1 and hand held assist. Pt is currently Min-Mod A overall for ADL's and functional mobility/transfers. Cont with OT goals to increase independence.   Follow Up Recommendations  SNF;Supervision/Assistance - 24 hour    Equipment Recommendations  Other (comment)(Defer to next venue)    Recommendations for Other Services      Precautions / Restrictions Precautions Precautions: Fall;Back Precaution Booklet Issued: No Precaution Comments: Reviewed back precautions with pt Restrictions Weight Bearing Restrictions: No       Mobility Bed Mobility Overal bed mobility: Needs Assistance Bed Mobility: Sidelying to Sit;Rolling;Sit to Sidelying Rolling: Min assist Sidelying to sit: Min assist     Sit to sidelying: Min assist General bed mobility comments: min A and vc for rolling, min A for pulling up on therapist to upright, minA for management of LE onto bed  Transfers Overall transfer level: Needs assistance Equipment used: 1 person hand held assist(Pt with increased stability in standing w/ RW; prefers hand held assist and multimodal cues) Transfers: Sit to/from [employer] Sit to Stand: Min assist Stand pivot transfers: Min assist       General transfer comment: minA for powerup and steadying    Balance Overall balance assessment: Needs assistance Sitting-balance support: Feet supported;No upper extremity  supported Sitting balance-Leahy Scale: Fair     Standing balance support: Bilateral upper extremity supported Standing balance-Leahy Scale: Poor Standing balance comment: Reliant on BUE support and external support for balance.                            ADL either performed or assessed with clinical judgement   ADL Overall ADL's : Needs assistance/impaired(Pt is blind)     Grooming: Set up;Sitting;Wash/dry hands;Wash/dry face;Applying deodorant   Upper Body Bathing: Set up;Sitting Upper Body Bathing Details (indicate cue type and reason): Sitting at EOB Lower Body Bathing: Minimal assistance;Sit to/from stand   Upper Body Dressing : Set up;Min guard;Sitting   Lower Body Dressing: Moderate assistance;Sit to/from stand Lower Body Dressing Details (indicate cue type and reason): Don/doff socks Toilet Transfer: Minimal assistance;Stand-pivot;BSC;Cueing for sequencing;Cueing for safety Toilet Transfer Details (indicate cue type and reason): VC's for safety/sequencing secondary to blindness Toileting- Clothing Manipulation and Hygiene: Sit to/from stand;Minimal assistance Toileting - Clothing Manipulation Details (indicate cue type and reason): Pt was Min A sit to stand from 3:1 and she completed peri care in standing when given wash cloths using left hand. Min A to steady and hold pt gown      Functional mobility during ADLs: Minimal assistance;Cueing for safety;Cueing for sequencing;Rolling walker(Used RW and multimodal cues for SPT to 3:1; pt is more stady with RW than hand held assist but prefers hand held assist noted.) General ADL Comments: Pt was seen for OT ADL retraining session today with focus on bathing/dressing with set up and multimodal cues sitting EOB, standing for peri care; transfer to 3:1. Overall Min- Mod A secondary to blindness. Pt  prefers to use 3:1 for toileting as she reports staff has been putting her on bedpan. Discussed thsi with NT.     Vision  Baseline Vision/History: (Pt is Blind) Patient Visual Report: (Pt is Blind)     Perception     Praxis      Cognition Arousal/Alertness: Awake/alert Behavior During Therapy: WFL for tasks assessed/performed Overall Cognitive Status: No family/caregiver present to determine baseline cognitive functioning                                          Exercises     Shoulder Instructions       General Comments      Pertinent Vitals/ Pain       Pain Assessment: 0-10 Pain Score: 8  Pain Location: "All over, back" Pain Descriptors / Indicators: Guarding;Grimacing;Sore Pain Intervention(s): Limited activity within patient's tolerance;Monitored during session;Repositioned  Home Living                                          Prior Functioning/Environment              Frequency  Min 2X/week        Progress Toward Goals  OT Goals(current goals can now be found in the care plan section)  Progress towards OT goals: Progressing toward goals  Acute Rehab OT Goals Patient Stated Goal: Get better  Plan Discharge plan remains appropriate    Co-evaluation                 AM-PAC PT "6 Clicks" Daily Activity     Outcome Measure   Help from another person eating meals?: A Little Help from another person taking care of personal grooming?: A Little Help from another person toileting, which includes using toliet, bedpan, or urinal?: A Little Help from another person bathing (including washing, rinsing, drying)?: A Lot Help from another person to put on and taking off regular upper body clothing?: A Little Help from another person to put on and taking off regular lower body clothing?: A Lot 6 Click Score: 16    End of Session    OT Visit Diagnosis: Unsteadiness on feet (R26.81);Muscle weakness (generalized) (M62.81);Pain Pain - Right/Left: (Back/all over) Pain - part of body: (See above)   Activity Tolerance Patient tolerated  treatment well;No increased pain   Patient Left in bed;with call bell/phone within reach;with bed alarm set;with nursing/sitter in room   Nurse Communication Mobility status;Other (comment)(Pt requesting use of 3:1 vs bedpan)        Time: 1610-9604 OT Time Calculation (min): 31 min  Charges: OT General Charges $OT Visit: 1 Visit OT Treatments $Self Care/Home Management : 23-37 mins     [place], Amy Beth Dixon, OTR/L 09/07/2018, 10:24 AM
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
## 2018-09-16 ENCOUNTER — Non-Acute Institutional Stay: Payer: Medicare Other | Admitting: Internal Medicine

## 2018-10-10 ENCOUNTER — Non-Acute Institutional Stay: Payer: Medicare Other | Admitting: Internal Medicine

## 2018-10-10 DIAGNOSIS — E46 Unspecified protein-calorie malnutrition: Secondary | ICD-10-CM

## 2018-10-10 DIAGNOSIS — Z515 Encounter for palliative care: Secondary | ICD-10-CM

## 2018-10-10 DIAGNOSIS — F339 Major depressive disorder, recurrent, unspecified: Secondary | ICD-10-CM

## 2018-10-11 NOTE — Progress Notes (Signed)
PALLIATIVE CARE CONSULT VISIT   PATIENT NAME: Mary Nguyen DOB: 1951-07-10 MRN: [id number]  PRIMARY CARE PROVIDER:   Dr. Patria Mane REFERRING PROVIDER: Dr. Patria Mane  RESPONSIBLE PARTY:   Self     RECOMMENDATIONS and PLAN:  1.  Protein calorie malnutrition  E46:  Improving.  Weight has increased from 73# to 88# since admission.  Continue Remeron qd (15-30mg ) and protein supplements.  Comfort feedings  2. Recurrent major depression  F33.9:  Increase current dose of Remeron as noted above.  Consider behavioral health consultation to maximize treatment.  Palliative will contact [employer] to assist patient with personal sensory items related to visual impairment.    3.  Palliative Care Encounter  Z51.5:  Pt. Still desires to return to hotel living environment with her caregiver. Discouraged this living environment with patient due to safety.  She requires total care 24 hrs per day.  Plan on attempts to contact son for further discussion.  Palliative care will continue to follow.  I spent 35 minutes providing this consultation,  from 3:00pm to 3:35pm at [employer]. More than 50% of the time in this consultation was spent coordinating communication with patient.  HISTORY OF PRESENT ILLNESS: Follow-up with Mary Nguyen.  She reports having a very good appetite and has gained 15#(current weight is 88#) since admission. Reports difficulty sleeping even with use of Remeron nightly. CODE STATUS:   PPS: 40% HOSPICE ELIGIBILITY/DIAGNOSIS: TBD  PAST MEDICAL HISTORY:  Past Medical History:  Diagnosis Date  . Blindness   . Bradycardia    a. pt reports hx of slow HR.  Marland Kitchen CAD in native artery    a. pt reports "blockage in the back of her heart" sometime in 2018 by cath at [facility].  . Carotid artery disease (HCC)    a. pt reports "blockage scraped" R neck artery around 2000.  . Cataract   . Former tobacco use   . Heart murmur   . Hyperlipidemia   . PAD (peripheral artery  disease) (HCC)    a. she was told she had poor circulation from the waist down.  . Skipped heart beats   . Stroke [facility])    a. 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    SOCIAL HX:  Social History   Tobacco Use  . Smoking status: Current Every Day Smoker    Packs/day: 0.50    Years: 10.00    Pack years: 5.00    Types: Cigarettes  . Smokeless tobacco: Never Used  . Tobacco comment: Pt reports she recently quit after 45 years but family says she still smokes  Substance Use Topics  . Alcohol use: Not Currently    ALLERGIES:  Allergies  Allergen Reactions  . Penicillins Anaphylaxis    Has patient had a PCN reaction causing immediate rash, facial/tongue/throat swelling, SOB or lightheadedness with hypotension: Yes Has patient had a PCN reaction causing severe rash involving mucus membranes or skin necrosis: No Has patient had a PCN reaction that required hospitalization: Yes Has patient had a PCN reaction occurring within the last 10 years: No If all of the above answers are "NO", then may proceed with Cephalosporin use.      PERTINENT MEDICATIONS:  Outpatient Encounter Medications as of 10/10/2018  Medication Sig  . acetaminophen (TYLENOL) 325 MG tablet Take 2 tablets (650 mg total) by mouth every 6 (six) hours.  Marland Kitchen  aspirin EC 81 MG EC tablet Take 1 tablet (81 mg total) by mouth daily.  Marland Kitchen atorvastatin (LIPITOR) 80 MG tablet Take 1 tablet (80 mg total) by mouth daily at 6 PM. (Patient not taking: Reported on 08/30/2018)  . calcium-vitamin D (OSCAL WITH D) 500-200 MG-UNIT tablet Take 1 tablet by mouth daily with breakfast.  . isosorbide mononitrate (IMDUR) 30 MG 24 hr tablet Take 1 tablet (30 mg total) by mouth daily.  . mirtazapine (REMERON) 7.5 MG tablet Take 1 tablet (7.5 mg total) by mouth at bedtime.  . Misc. Devices (BATH/SHOWER SEAT) MISC Shower seat  . nitroGLYCERIN (NITROSTAT) 0.4 MG SL tablet Place 1  tablet (0.4 mg total) under the tongue every 5 (five) minutes as needed for chest pain.  . pantoprazole (PROTONIX) 20 MG tablet Take 1 tablet (20 mg total) by mouth daily.  . polyethylene glycol (MIRALAX / GLYCOLAX) packet Take 17 g by mouth daily as needed for mild constipation.   No facility-administered encounter medications on file as of 10/10/2018.     PHYSICAL EXAM:   General: Chronically ill and frail appearing, Cachetic.  Sitting on bedside Cardiovascular: regular rate and rhythm Pulmonary: clear in all fields Abdomen: soft, nontender, + bowel sounds GU: no suprapubic tenderness Extremities: no edema or ecchymosis Skin: Exposed skin is intact Neurological: A&O x3, R hemiparesis and generalized weakness  [name], NP-C

## 2018-10-28 ENCOUNTER — Inpatient Hospital Stay (HOSPITAL_COMMUNITY)
Admission: EM | Admit: 2018-10-28 | Discharge: 2018-11-02 | DRG: 280 | Disposition: A | Discharge status: Skilled Nursing Facility | Payer: Medicare Other | Source: Skilled Nursing Facility | Attending: Oncology | Admitting: Oncology

## 2018-10-28 ENCOUNTER — Other Ambulatory Visit: Payer: Self-pay

## 2018-10-28 ENCOUNTER — Emergency Department (HOSPITAL_COMMUNITY): Payer: Medicare Other

## 2018-10-28 ENCOUNTER — Encounter (HOSPITAL_COMMUNITY): Payer: Self-pay

## 2018-10-28 DIAGNOSIS — R001 Bradycardia, unspecified: Secondary | ICD-10-CM | POA: Diagnosis not present

## 2018-10-28 DIAGNOSIS — Z8673 Personal history of transient ischemic attack (TIA), and cerebral infarction without residual deficits: Secondary | ICD-10-CM

## 2018-10-28 DIAGNOSIS — R079 Chest pain, unspecified: Secondary | ICD-10-CM

## 2018-10-28 DIAGNOSIS — Z7982 Long term (current) use of aspirin: Secondary | ICD-10-CM

## 2018-10-28 DIAGNOSIS — I70203 Unspecified atherosclerosis of native arteries of extremities, bilateral legs: Secondary | ICD-10-CM | POA: Diagnosis present

## 2018-10-28 DIAGNOSIS — I25110 Atherosclerotic heart disease of native coronary artery with unstable angina pectoris: Secondary | ICD-10-CM | POA: Diagnosis present

## 2018-10-28 DIAGNOSIS — H547 Unspecified visual loss: Secondary | ICD-10-CM

## 2018-10-28 DIAGNOSIS — I441 Atrioventricular block, second degree: Secondary | ICD-10-CM | POA: Diagnosis not present

## 2018-10-28 DIAGNOSIS — I110 Hypertensive heart disease with heart failure: Secondary | ICD-10-CM | POA: Diagnosis present

## 2018-10-28 DIAGNOSIS — R011 Cardiac murmur, unspecified: Secondary | ICD-10-CM | POA: Diagnosis present

## 2018-10-28 DIAGNOSIS — I739 Peripheral vascular disease, unspecified: Secondary | ICD-10-CM

## 2018-10-28 DIAGNOSIS — I69351 Hemiplegia and hemiparesis following cerebral infarction affecting right dominant side: Secondary | ICD-10-CM

## 2018-10-28 DIAGNOSIS — F17210 Nicotine dependence, cigarettes, uncomplicated: Secondary | ICD-10-CM | POA: Diagnosis present

## 2018-10-28 DIAGNOSIS — I959 Hypotension, unspecified: Secondary | ICD-10-CM | POA: Diagnosis not present

## 2018-10-28 DIAGNOSIS — I214 Non-ST elevation (NSTEMI) myocardial infarction: Secondary | ICD-10-CM | POA: Diagnosis not present

## 2018-10-28 DIAGNOSIS — R54 Age-related physical debility: Secondary | ICD-10-CM | POA: Diagnosis present

## 2018-10-28 DIAGNOSIS — K5900 Constipation, unspecified: Secondary | ICD-10-CM

## 2018-10-28 DIAGNOSIS — R1011 Right upper quadrant pain: Secondary | ICD-10-CM | POA: Diagnosis not present

## 2018-10-28 DIAGNOSIS — R1010 Upper abdominal pain, unspecified: Secondary | ICD-10-CM

## 2018-10-28 DIAGNOSIS — F419 Anxiety disorder, unspecified: Secondary | ICD-10-CM | POA: Diagnosis present

## 2018-10-28 DIAGNOSIS — I700 Atherosclerosis of aorta: Secondary | ICD-10-CM

## 2018-10-28 DIAGNOSIS — Z79899 Other long term (current) drug therapy: Secondary | ICD-10-CM

## 2018-10-28 DIAGNOSIS — F17211 Nicotine dependence, cigarettes, in remission: Secondary | ICD-10-CM

## 2018-10-28 DIAGNOSIS — D539 Nutritional anemia, unspecified: Secondary | ICD-10-CM | POA: Diagnosis present

## 2018-10-28 DIAGNOSIS — E785 Hyperlipidemia, unspecified: Secondary | ICD-10-CM | POA: Diagnosis present

## 2018-10-28 DIAGNOSIS — Z8249 Family history of ischemic heart disease and other diseases of the circulatory system: Secondary | ICD-10-CM

## 2018-10-28 DIAGNOSIS — H269 Unspecified cataract: Secondary | ICD-10-CM

## 2018-10-28 DIAGNOSIS — R109 Unspecified abdominal pain: Secondary | ICD-10-CM

## 2018-10-28 DIAGNOSIS — Z7189 Other specified counseling: Secondary | ICD-10-CM

## 2018-10-28 DIAGNOSIS — J449 Chronic obstructive pulmonary disease, unspecified: Secondary | ICD-10-CM | POA: Diagnosis present

## 2018-10-28 DIAGNOSIS — I5043 Acute on chronic combined systolic (congestive) and diastolic (congestive) heart failure: Secondary | ICD-10-CM | POA: Diagnosis present

## 2018-10-28 DIAGNOSIS — R7989 Other specified abnormal findings of blood chemistry: Secondary | ICD-10-CM

## 2018-10-28 DIAGNOSIS — Z880 Allergy status to penicillin: Secondary | ICD-10-CM

## 2018-10-28 DIAGNOSIS — T463X5A Adverse effect of coronary vasodilators, initial encounter: Secondary | ICD-10-CM | POA: Diagnosis present

## 2018-10-28 LAB — COMPREHENSIVE METABOLIC PANEL
ALT: 57 U/L — AB (ref 0–44)
ANION GAP: 6 (ref 5–15)
AST: 46 U/L — ABNORMAL HIGH (ref 15–41)
Albumin: 3.3 g/dL — ABNORMAL LOW (ref 3.5–5.0)
Alkaline Phosphatase: 135 U/L — ABNORMAL HIGH (ref 38–126)
BUN: 25 mg/dL — ABNORMAL HIGH (ref 8–23)
CALCIUM: 9.5 mg/dL (ref 8.9–10.3)
CO2: 25 mmol/L (ref 22–32)
Chloride: 109 mmol/L (ref 98–111)
Creatinine, Ser: 0.96 mg/dL (ref 0.44–1.00)
GLUCOSE: 121 mg/dL — AB (ref 70–99)
Potassium: 3.9 mmol/L (ref 3.5–5.1)
Sodium: 140 mmol/L (ref 135–145)
TOTAL PROTEIN: 6.4 g/dL — AB (ref 6.5–8.1)
Total Bilirubin: 0.4 mg/dL (ref 0.3–1.2)

## 2018-10-28 LAB — CBC WITH DIFFERENTIAL/PLATELET
ABS IMMATURE GRANULOCYTES: 0.07 10*3/uL (ref 0.00–0.07)
BASOS PCT: 1 %
Basophils Absolute: 0.1 10*3/uL (ref 0.0–0.1)
EOS ABS: 0.2 10*3/uL (ref 0.0–0.5)
Eosinophils Relative: 2 %
HCT: 37 % (ref 36.0–46.0)
Hemoglobin: 11.4 g/dL — ABNORMAL LOW (ref 12.0–15.0)
Immature Granulocytes: 1 %
Lymphocytes Relative: 24 %
Lymphs Abs: 2.5 10*3/uL (ref 0.7–4.0)
MCH: 32.8 pg (ref 26.0–34.0)
MCHC: 30.8 g/dL (ref 30.0–36.0)
MCV: 106.3 fL — AB (ref 80.0–100.0)
MONOS PCT: 9 %
Monocytes Absolute: 0.9 10*3/uL (ref 0.1–1.0)
Neutro Abs: 6.8 10*3/uL (ref 1.7–7.7)
Neutrophils Relative %: 63 %
PLATELETS: 237 10*3/uL (ref 150–400)
RBC: 3.48 MIL/uL — ABNORMAL LOW (ref 3.87–5.11)
RDW: 13.9 % (ref 11.5–15.5)
WBC: 10.6 10*3/uL — ABNORMAL HIGH (ref 4.0–10.5)
nRBC: 0 % (ref 0.0–0.2)

## 2018-10-28 LAB — URINALYSIS, ROUTINE W REFLEX MICROSCOPIC
Bilirubin Urine: NEGATIVE
Glucose, UA: NEGATIVE mg/dL
KETONES UR: NEGATIVE mg/dL
Leukocytes, UA: NEGATIVE
Nitrite: NEGATIVE
Protein, ur: NEGATIVE mg/dL
Specific Gravity, Urine: 1.024 (ref 1.005–1.030)
pH: 5 (ref 5.0–8.0)

## 2018-10-28 LAB — I-STAT TROPONIN, ED
TROPONIN I, POC: 0.01 ng/mL (ref 0.00–0.08)
Troponin i, poc: 0.03 ng/mL (ref 0.00–0.08)
Troponin i, poc: 0.05 ng/mL (ref 0.00–0.08)

## 2018-10-28 LAB — LIPASE, BLOOD: LIPASE: 35 U/L (ref 11–51)

## 2018-10-28 LAB — I-STAT CG4 LACTIC ACID, ED: LACTIC ACID, VENOUS: 0.8 mmol/L (ref 0.5–1.9)

## 2018-10-28 LAB — TROPONIN I
TROPONIN I: 0.05 ng/mL — AB (ref <0.03)
Troponin I: 0.07 ng/mL (ref <0.03)

## 2018-10-28 LAB — D-DIMER, QUANTITATIVE: D-Dimer, Quant: 1.24 ug/mL-FEU — ABNORMAL HIGH (ref 0.00–0.50)

## 2018-10-28 MED ORDER — ONDANSETRON HCL 4 MG/2ML IJ SOLN
4.0000 mg | Freq: Once | INTRAMUSCULAR | Status: AC
  Administered 2018-10-28: 4 mg via INTRAVENOUS
  Filled 2018-10-28: qty 2

## 2018-10-28 MED ORDER — ATORVASTATIN CALCIUM 80 MG PO TABS
80.0000 mg | ORAL_TABLET | Freq: Every day | ORAL | Status: DC
  Administered 2018-10-28 – 2018-11-01 (×5): 80 mg via ORAL
  Filled 2018-10-28 (×4): qty 1
  Filled 2018-10-28: qty 4
  Filled 2018-10-28: qty 1

## 2018-10-28 MED ORDER — OXYBUTYNIN CHLORIDE 5 MG PO TABS
5.0000 mg | ORAL_TABLET | Freq: Two times a day (BID) | ORAL | Status: DC
  Administered 2018-10-28 – 2018-11-02 (×10): 5 mg via ORAL
  Filled 2018-10-28 (×10): qty 1

## 2018-10-28 MED ORDER — SODIUM CHLORIDE 0.9 % IV BOLUS (SEPSIS)
1000.0000 mL | Freq: Once | INTRAVENOUS | Status: AC
  Administered 2018-10-28: 1000 mL via INTRAVENOUS

## 2018-10-28 MED ORDER — MIRTAZAPINE 7.5 MG PO TABS
7.5000 mg | ORAL_TABLET | Freq: Every day | ORAL | Status: DC
  Administered 2018-10-28 – 2018-11-01 (×5): 7.5 mg via ORAL
  Filled 2018-10-28 (×5): qty 1

## 2018-10-28 MED ORDER — GABAPENTIN 300 MG PO CAPS
300.0000 mg | ORAL_CAPSULE | Freq: Three times a day (TID) | ORAL | Status: DC
  Administered 2018-10-28 – 2018-11-02 (×15): 300 mg via ORAL
  Filled 2018-10-28 (×15): qty 1

## 2018-10-28 MED ORDER — ASPIRIN EC 81 MG PO TBEC
81.0000 mg | DELAYED_RELEASE_TABLET | Freq: Every day | ORAL | Status: DC
  Administered 2018-10-29 – 2018-11-02 (×5): 81 mg via ORAL
  Filled 2018-10-28 (×5): qty 1

## 2018-10-28 MED ORDER — HEPARIN BOLUS VIA INFUSION
2400.0000 [IU] | Freq: Once | INTRAVENOUS | Status: DC
  Filled 2018-10-28: qty 2400

## 2018-10-28 MED ORDER — PANTOPRAZOLE SODIUM 20 MG PO TBEC
20.0000 mg | DELAYED_RELEASE_TABLET | Freq: Every day | ORAL | Status: DC
  Administered 2018-10-29 – 2018-11-02 (×5): 20 mg via ORAL
  Filled 2018-10-28 (×5): qty 1

## 2018-10-28 MED ORDER — HEPARIN (PORCINE) 25000 UT/250ML-% IV SOLN
900.0000 [IU]/h | INTRAVENOUS | Status: DC
  Administered 2018-10-28: 550 [IU]/h via INTRAVENOUS
  Administered 2018-10-29 – 2018-11-01 (×3): 900 [IU]/h via INTRAVENOUS
  Filled 2018-10-28 (×4): qty 250

## 2018-10-28 MED ORDER — ACETAMINOPHEN 325 MG PO TABS
650.0000 mg | ORAL_TABLET | Freq: Four times a day (QID) | ORAL | Status: DC | PRN
  Administered 2018-10-29 – 2018-11-02 (×7): 650 mg via ORAL
  Filled 2018-10-28 (×7): qty 2

## 2018-10-28 MED ORDER — IOPAMIDOL (ISOVUE-370) INJECTION 76%
100.0000 mL | Freq: Once | INTRAVENOUS | Status: AC | PRN
  Administered 2018-10-28: 100 mL via INTRAVENOUS

## 2018-10-28 MED ORDER — FAMOTIDINE IN NACL 20-0.9 MG/50ML-% IV SOLN
20.0000 mg | Freq: Once | INTRAVENOUS | Status: AC
  Administered 2018-10-28: 20 mg via INTRAVENOUS
  Filled 2018-10-28: qty 50

## 2018-10-28 MED ORDER — HEPARIN BOLUS VIA INFUSION
2700.0000 [IU] | Freq: Once | INTRAVENOUS | Status: AC
  Administered 2018-10-28: 2700 [IU] via INTRAVENOUS
  Filled 2018-10-28: qty 2700

## 2018-10-28 MED ORDER — ALUM & MAG HYDROXIDE-SIMETH 200-200-20 MG/5ML PO SUSP
30.0000 mL | Freq: Once | ORAL | Status: AC
  Administered 2018-10-28: 30 mL via ORAL
  Filled 2018-10-28: qty 30

## 2018-10-28 MED ORDER — SENNOSIDES-DOCUSATE SODIUM 8.6-50 MG PO TABS: 1.0000 | ORAL_TABLET | Freq: Every evening | ORAL | Status: DC | PRN

## 2018-10-28 MED ORDER — ASPIRIN 81 MG PO CHEW: 324.0000 mg | CHEWABLE_TABLET | Freq: Once | ORAL | Status: DC

## 2018-10-28 MED ORDER — FENTANYL CITRATE (PF) 100 MCG/2ML IJ SOLN
50.0000 ug | Freq: Once | INTRAMUSCULAR | Status: AC
  Administered 2018-10-28: 50 ug via INTRAVENOUS
  Filled 2018-10-28: qty 2

## 2018-10-28 MED ORDER — MORPHINE SULFATE (PF) 2 MG/ML IV SOLN: 1.0000 mg | INTRAVENOUS | Status: DC | PRN

## 2018-10-28 MED ORDER — SODIUM CHLORIDE 0.9 % IV SOLN
Freq: Once | INTRAVENOUS | Status: AC
  Administered 2018-10-28: 12:00:00 via INTRAVENOUS

## 2018-10-28 MED ORDER — NITROGLYCERIN 0.4 MG SL SUBL: 0.4000 mg | SUBLINGUAL_TABLET | SUBLINGUAL | Status: DC | PRN

## 2018-10-28 MED ORDER — BUSPIRONE HCL 5 MG PO TABS
10.0000 mg | ORAL_TABLET | Freq: Three times a day (TID) | ORAL | Status: DC
  Administered 2018-10-28 – 2018-11-02 (×15): 10 mg via ORAL
  Filled 2018-10-28: qty 1
  Filled 2018-10-28 (×15): qty 2

## 2018-10-28 MED ORDER — ENOXAPARIN SODIUM 40 MG/0.4ML ~~LOC~~ SOLN: 40.0000 mg | SUBCUTANEOUS | Status: DC

## 2018-10-28 MED ORDER — LIDOCAINE VISCOUS HCL 2 % MT SOLN
15.0000 mL | Freq: Once | OROMUCOSAL | Status: AC
  Administered 2018-10-28: 15 mL via ORAL
  Filled 2018-10-28: qty 15

## 2018-10-28 MED ORDER — ACETAMINOPHEN 650 MG RE SUPP: 650.0000 mg | Freq: Four times a day (QID) | RECTAL | Status: DC | PRN

## 2018-10-28 NOTE — Progress Notes (Signed)
ANTICOAGULATION CONSULT NOTE - Initial Consult  Pharmacy Consult for heparin Indication: chest pain/ACS  Allergies  Allergen Reactions  . Penicillins Anaphylaxis    Has patient had a PCN reaction causing immediate rash, facial/tongue/throat swelling, SOB or lightheadedness with hypotension: Yes Has patient had a PCN reaction causing severe rash involving mucus membranes or skin necrosis: No Has patient had a PCN reaction that required hospitalization: Yes Has patient had a PCN reaction occurring within the last 10 years: No If all of the above answers are "NO", then may proceed with Cephalosporin use.     Patient Measurements: Weight: 100 lb 11.2 oz (45.7 kg)  Vital Signs: Temp: 97.4 F (36.3 C) (11/22 0550) Temp Source: Rectal (11/22 0550) BP: 111/68 (11/22 1606) Pulse Rate: 26 (11/22 1415)  Labs: Recent Labs    10/28/18 0550 10/28/18 1318  HGB 11.4*  --   HCT 37.0  --   PLT 237  --   CREATININE 0.96  --   TROPONINI  --  0.07*    Estimated Creatinine Clearance: 41.6 mL/min (by C-G formula based on SCr of 0.96 mg/dL).   Medical History: Past Medical History:  Diagnosis Date  . Blindness   . Bradycardia    a. pt reports hx of slow HR.  Marland Kitchen. CAD in native artery    a. pt reports "blockage in the back of her heart" [facility].  . Carotid artery disease (HCC)    a. pt reports "blockage scraped" R neck artery around 2000.  . Cataract   . Former tobacco use   . Heart murmur   . Hyperlipidemia   . PAD (peripheral artery disease) (HCC)    a. she was told she had poor circulation from the waist down.  . Skipped heart beats   . Stroke [facility](HCC)    a. 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    Medications:  Medications Prior to Admission  Medication Sig Dispense Refill Last Dose  . acetaminophen (TYLENOL) 325 MG tablet Take 2 tablets (650 mg total) by mouth every 6  (six) hours.   10/28/2018 at Unknown time  . aspirin EC 81 MG EC tablet Take 1 tablet (81 mg total) by mouth daily. 30 tablet 0 10/27/2018 at 0900  . atorvastatin (LIPITOR) 80 MG tablet Take 1 tablet (80 mg total) by mouth daily at 6 PM. 30 tablet 0 10/27/2018 at Unknown time  . busPIRone (BUSPAR) 5 MG tablet Take 10 mg by mouth 3 (three) times daily.   10/27/2018 at Unknown time  . calcium-vitamin D (OSCAL WITH D) 500-200 MG-UNIT tablet Take 1 tablet by mouth daily with breakfast. 30 tablet 0 10/27/2018 at Unknown time  . gabapentin (NEURONTIN) 300 MG capsule Take 1 capsule (300 mg total) by mouth 3 (three) times daily for 7 days. 21 capsule 0 10/28/2018 at Unknown time  . isosorbide mononitrate (IMDUR) 30 MG 24 hr tablet Take 1 tablet (30 mg total) by mouth daily. 30 tablet 0 10/27/2018 at Unknown time  . Melatonin 3 MG TABS Take 3 mg by mouth at bedtime.   10/26/2018  . mirtazapine (REMERON) 7.5 MG tablet Take 1 tablet (7.5 mg total) by mouth at bedtime. 30 tablet 0 10/26/2018  . nitroGLYCERIN (NITROSTAT) 0.4 MG SL tablet Place 1 tablet (0.4 mg total) under the tongue every 5 (five) minutes as needed for chest pain. 30  tablet 0 unk  . oxybutynin (DITROPAN) 5 MG tablet Take 5 mg by mouth 2 (two) times daily.   10/27/2018 at Unknown time  . pantoprazole (PROTONIX) 20 MG tablet Take 1 tablet (20 mg total) by mouth daily. 30 tablet 0 10/28/2018 at Unknown time  . polyethylene glycol (MIRALAX / GLYCOLAX) packet Take 17 g by mouth daily as needed for mild constipation. 14 each 0 unk  . Misc. Devices (BATH/SHOWER SEAT) MISC Shower seat (Patient not taking: Reported on 10/28/2018) 1 each 0 Not Taking at Unknown time    Assessment: 67 y/o female admitted from SNF for chest pain that was relieved by nitroglycerin. Pharmacy consulted to begin IV heparin. No bleeding noted, Hgb 11.4, platelets are normal. Troponin 0.03 >> 0.07. No anticoagulation PTA. CTA chest neg for PE.   Goal of Therapy:  Heparin  level 0.3-0.7 units/ml Monitor platelets by anticoagulation protocol: Yes   Plan:  Heparin 2700 units IV bolus then infuse at 550 units/hr 6 hr heparin level Daily heparin level and CBC Monitor for s/sx of bleeding   Loura Back, PharmD, BCPS Clinical Pharmacist Clinical phone for 10/28/2018 until 10p is [phone] 10/28/2018 4:22 PM  **Pharmacist phone directory can now be found on [email or web address] listed under [facility]**

## 2018-10-28 NOTE — ED Triage Notes (Addendum)
Pt arrived via [facility]; pt from [facility] with c/o CP approximately 50 mins ago with burning sensation on L side radiating down left arm; pt was at rest when pain started; hx COPD; Pt rec'd 325mg  ASA and 1 nitro with no relief; EMS administered 1 nitro; pt states that pain currently is 8/10;  EMS reports bigeminy and HR in 40s; 101/64, 80

## 2018-10-28 NOTE — ED Provider Notes (Signed)
TIME SEEN: 5:44 AM  CHIEF COMPLAINT: Chest pain  HPI: Patient is a 67 year old female with history of COPD not on oxygen chronically, peripheral arterial disease, previous strokes, hyperlipidemia, tobacco use who quit 2 months ago who lives at [facility] who presents today with left-sided chest pain.  Describes the pain as a pressure, burning pain that radiates into her shoulder and down her left arm.  Given aspirin 324 mg at her nursing facility and 1 nitroglycerin with some relief of pain.  Given a second nitroglycerin with EMS which improved her pain further.  EMS states that they noted her to have a heart rate in the 40s and look like she may have been in bigeminy.  Patient reports symptoms came on at rest.  No aggravating or alleviating factors.  No lower extremity swelling or pain.  No known fever but has had cough.  No nausea, vomiting, diaphoresis, dizziness.  No history of PE or DVT.  States last had a stress test several years ago.  No history of successful cardiac catheterization.  EMS reports patient's oxygen saturation was in the mid 80s.  Doing well on nasal cannula.  Does not wear oxygen chronically.  Patient had grade 1 diastolic dysfunction seen on echocardiogram in April 2019.  ROS: See HPI Constitutional: no fever  Eyes: no drainage  ENT: no runny nose   Cardiovascular:   chest pain  Resp:  SOB  GI: no vomiting GU: no dysuria Integumentary: no rash  Allergy: no hives  Musculoskeletal: no leg swelling  Neurological: no slurred speech ROS otherwise negative  PAST MEDICAL HISTORY/PAST SURGICAL HISTORY:  Past Medical History:  Diagnosis Date  . Blindness   . Bradycardia    a. pt reports hx of slow HR.  Marland Kitchen CAD in native artery    a. pt reports "blockage in the back of her heart" sometime in 2018 by cath at [facility].  . Carotid artery disease (HCC)    a. pt reports "blockage scraped" R neck artery around 2000.  . Cataract   . Former tobacco use   . Heart  murmur   . Hyperlipidemia   . PAD (peripheral artery disease) (HCC)    a. she was told she had poor circulation from the waist down.  . Skipped heart beats   . Stroke [facility])    a. 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    MEDICATIONS:  Prior to Admission medications   Medication Sig Start Date End Date Taking? Authorizing Provider  acetaminophen (TYLENOL) 325 MG tablet Take 2 tablets (650 mg total) by mouth every 6 (six) hours. 09/07/18   Meccariello, Solmon Ice, DO  aspirin EC 81 MG EC tablet Take 1 tablet (81 mg total) by mouth daily. 09/08/18   Meccariello, Solmon Ice, DO  atorvastatin (LIPITOR) 80 MG tablet Take 1 tablet (80 mg total) by mouth daily at 6 PM. Patient not taking: Reported on 08/30/2018 04/06/18   Laverna Peace, MD  calcium-vitamin D (OSCAL WITH D) 500-200 MG-UNIT tablet Take 1 tablet by mouth daily with breakfast. 09/08/18   Meccariello, Solmon Ice, DO  gabapentin (NEURONTIN) 300 MG capsule Take 1 capsule (300 mg total) by mouth 3 (three) times daily for 7 days. Patient not taking: Reported on 04/01/2018 03/23/18 03/30/18  Cristina Gong, PA-C  isosorbide mononitrate (IMDUR) 30 MG 24 hr tablet Take 1 tablet (30 mg total)  by mouth daily. 09/08/18   Meccariello, Solmon IceBailey J, DO  mirtazapine (REMERON) 7.5 MG tablet Take 1 tablet (7.5 mg total) by mouth at bedtime. 09/07/18   Meccariello, Solmon IceBailey J, DO  Misc. Devices (BATH/SHOWER SEAT) MISC Shower seat 04/06/18   Roberto ScalesNettey, Shayla D, MD  nitroGLYCERIN (NITROSTAT) 0.4 MG SL tablet Place 1 tablet (0.4 mg total) under the tongue every 5 (five) minutes as needed for chest pain. 09/07/18   Meccariello, Solmon IceBailey J, DO  pantoprazole (PROTONIX) 20 MG tablet Take 1 tablet (20 mg total) by mouth daily. 09/07/18   Meccariello, Solmon IceBailey J, DO  polyethylene glycol (MIRALAX / GLYCOLAX) packet Take 17 g by mouth daily as needed for mild constipation. 09/07/18   Meccariello, Solmon IceBailey J, DO     ALLERGIES:  Allergies  Allergen Reactions  . Penicillins Anaphylaxis    Has patient had a PCN reaction causing immediate rash, facial/tongue/throat swelling, SOB or lightheadedness with hypotension: Yes Has patient had a PCN reaction causing severe rash involving mucus membranes or skin necrosis: No Has patient had a PCN reaction that required hospitalization: Yes Has patient had a PCN reaction occurring within the last 10 years: No If all of the above answers are "NO", then may proceed with Cephalosporin use.     SOCIAL HISTORY:  Social History   Tobacco Use  . Smoking status: Current Every Day Smoker    Packs/day: 0.50    Years: 10.00    Pack years: 5.00    Types: Cigarettes  . Smokeless tobacco: Never Used  . Tobacco comment: Pt reports she recently quit after 45 years but family says she still smokes  Substance Use Topics  . Alcohol use: Not Currently    FAMILY HISTORY: Family History  Problem Relation Age of Onset  . Hypertension Mother   . Heart disease Father        details unclear    EXAM: BP (!) 89/59   Pulse 82   Temp (!) 97.4 F (36.3 C) (Rectal)   Resp 18   SpO2 96%  CONSTITUTIONAL: Alert and oriented and responds appropriately to questions.  Elderly, thin, chronically ill-appearing HEAD: Normocephalic EYES: Conjunctivae clear, patient's right pupil is chronically dilated compared to the left with glassy appearance, EOMI ENT: normal nose; moist mucous membranes NECK: Supple, no meningismus, no nuchal rigidity, no LAD  CARD: RRR; S1 and S2 appreciated; no murmurs, no clicks, no rubs, no gallops RESP: Normal chest excursion without splinting or tachypnea; breath sounds clear and equal bilaterally; no wheezes, no rhonchi, no rales, no hypoxia or respiratory distress, speaking full sentences ABD/GI: Normal bowel sounds; non-distended; soft, non-tender, no rebound, no guarding, no peritoneal signs, no hepatosplenomegaly BACK:  The back appears normal and  is non-tender to palpation, there is no CVA tenderness EXT: Normal ROM in all joints; non-tender to palpation; no edema; normal capillary refill; no cyanosis, no calf tenderness or swelling    SKIN: Normal color for age and race; warm; no rash NEURO: Moves all extremities equally PSYCH: The patient's mood and manner are appropriate. Grooming and personal hygiene are appropriate.  MEDICAL DECISION MAKING: Patient here with chest pain, hypoxia, shortness of breath.  Differential includes ACS, PE, dissection, pneumonia, CHF.  It does appear on review of her records that cardiac catheter[place]  and again here on April 04, 2018 but was unsuccessful due to inability to obtain access because of severe peripheral arterial disease.  She does have risk factors for ACS.  Will  obtain cardiac labs, chest x-ray, d-dimer given her hypoxia.  History is somewhat atypical as she does describe her pain as burning.    ED PROGRESS: Patient found to be hypotensive.  Will check rectal temperature.  This could be from recent nitroglycerin use.  Will give IV fluids.  Blood pressure improving.  Hypotension likely from nitroglycerin.  Lactate normal.  Rectal temp normal.  Still having chest pain.  Will give IV fentanyl.   LFTs are mildly elevated.  On reexamination, patient is now having some epigastric and right upper quadrant abdominal pain.  She has had a tubal ligation but no other abdominal surgeries.  Will obtain right upper quadrant ultrasound as well as add on a lipase.  Symptoms may be from gastritis, cholelithiasis, pancreatitis.  Will treat with Maalox, lidocaine, Pepcid.  Labs (repeat troponin, d dimer, lipase), [place] pending.  Signed out to Dr. Lockie Mola to follow up on patient's work up for dispo.   I reviewed all nursing notes, vitals, pertinent previous records, EKGs, lab and urine results, imaging (as available).    EKG Interpretation  Date/Time:  Friday October 28 2018 05:41:17 EST Ventricular Rate:  77 PR Interval:    QRS Duration: 88 QT Interval:  406 QTC Calculation: 460 R Axis:   43 Text Interpretation:  Sinus rhythm LVH with secondary repolarization abnormality New T waves changes in lateral leads Confirmed by Irfan Veal, Baxter Hire [phone]) on 10/28/2018 5:52:08 AM          Mohmed Farver, Layla Maw, DO 10/28/18 0700

## 2018-10-28 NOTE — H&P (Addendum)
Date: 10/28/2018               Patient Name:  Mary Nguyen MRN: [id number]  DOB: 1951-05-19 Age / Sex: 67 y.o., female   PCP: Justin Mend, MD         Medical Service: Internal Medicine Teaching Service         Attending Physician: Dr. Doneen Poisson, MD    First Contact: Dr. Petra Kuba Pager: [phone]  Second Contact: Dr. Evelene Croon Pager: [phone]       After Hours (After 5p/  First Contact Pager: [phone]  weekends / holidays): Second Contact Pager: [phone]   Chief Complaint: Chest pain  History of Present Illness: Ms. 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 She reports the pain radiated to her jaw, down her left arm and her back.  It was relieved with nitroglycerin.  She has associated symptoms of nausea and mild shortness of breath.  She denies vomiting, fever/chills, or orthopnea.  Meds:  Current Meds  Medication Sig  . acetaminophen (TYLENOL) 325 MG tablet Take 2 tablets (650 mg total) by mouth every 6 (six) hours.  Marland Kitchen aspirin EC 81 MG EC tablet Take 1 tablet (81 mg total) by mouth daily.  Marland Kitchen atorvastatin (LIPITOR) 80 MG tablet Take 1 tablet (80 mg total) by mouth daily at 6 PM.  . busPIRone (BUSPAR) 5 MG tablet Take 10 mg by mouth 3 (three) times daily.  . calcium-vitamin D (OSCAL WITH D) 500-200 MG-UNIT tablet Take 1 tablet by mouth daily with breakfast.  . gabapentin (NEURONTIN) 300 MG capsule Take 1 capsule (300 mg total) by mouth 3 (three) times daily for 7 days.  . isosorbide mononitrate (IMDUR) 30 MG 24 hr tablet Take 1 tablet (30 mg total) by mouth daily.  . Melatonin 3 MG TABS Take 3 mg by mouth at bedtime.  . mirtazapine (REMERON) 7.5 MG tablet Take 1 tablet (7.5 mg total) by  mouth at bedtime.  . nitroGLYCERIN (NITROSTAT) 0.4 MG SL tablet Place 1 tablet (0.4 mg total) under the tongue every 5 (five) minutes as needed for chest pain.  Marland Kitchen oxybutynin (DITROPAN) 5 MG tablet Take 5 mg by mouth 2 (two) times daily.  . pantoprazole (PROTONIX) 20 MG tablet Take 1 tablet (20 mg total) by mouth daily.  . polyethylene glycol (MIRALAX / GLYCOLAX) packet Take 17 g by mouth daily as needed for mild constipation.     Allergies: Allergies as of 10/28/2018 - Review Complete 10/28/2018  Allergen Reaction Noted  . Penicillins Anaphylaxis 03/23/2018   Past Medical History:  Diagnosis Date  . Blindness   . Bradycardia    a. pt reports hx of slow HR.  Marland Kitchen CAD in native artery    a. pt reports "blockage in the back of her heart" sometime in 2018 by cath at [facility].  . Carotid artery disease (HCC)    a. pt reports "blockage scraped" R neck artery around 2000.  . Cataract   . Former tobacco use   . Heart murmur   . Hyperlipidemia   . PAD (peripheral artery disease) (HCC)    a. she was told she had poor circulation from the waist down.  . Skipped heart beats   .  Stroke [facility](HCC)    a. 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    Family History:  Family History  Problem Relation Age of Onset  . Hypertension Mother   . Heart disease Father        details unclear     Social History: tobacco use: Former smoker. Smoked for 40 years anywhere between 0.25 to 1pack per day.  Quit 2 months ago (08/2018) Alcohol use: Denies Illicit drug use: Denies  Review of Systems: A complete ROS was negative except as per HPI.   Physical Exam: Blood pressure 111/67, pulse (!) 25, temperature (!) 97.4 F (36.3 C), temperature source Rectal, resp. rate 20, SpO2 97 %. Physical Exam  Constitutional: She is oriented to person, place, and time.  Appears older than stated age  HENT:  Head: Normocephalic and atraumatic.    Eyes:  Blind. Cataract noted in right eye  Neck: Normal range of motion.  Cardiovascular: Normal rate, regular rhythm and normal heart sounds. Exam reveals no gallop and no friction rub.  No murmur heard. Pulmonary/Chest: Effort normal and breath sounds normal. No respiratory distress. She has no wheezes.  Scant bibasilar rales  Abdominal: Soft. Bowel sounds are normal. She exhibits no distension and no mass. There is no rebound and no guarding.  Diffuse mild tenderness  Musculoskeletal: She exhibits no edema.  Neurological: She is alert and oriented to person, place, and time.  Skin: Skin is warm and dry.    EKG: personally reviewed my interpretation is normal sinus rhythm with new T wave changes in the lateral leads, LVH repolarization  CXR: personally reviewed my interpretation is hyperinflated lungs, no pleural effusion, vascular congestion or consolidation noted  Assessment & Plan by Problem: Active Problems:   Chest pain  Chest pain Patient is presenting with typical and atypical features. Patient has a heart score of 6.  Her risk factors for heart disease include her smoking history, family history, hypertension and hyperlipidemia.  CT angiogram of the chest showed cardiomegaly, coronary artery disease and aortic arthrosclerosis.  Her I-STAT troponin was negative. D-dimer was elevated in the ED and CT angiogram of the chest showed no acute signs of pulmonary embolism. There were new T-wave changes in the lateral leads on EKG. In 03/2018 patient presented with similar symptoms and a cardiac cath was attempted however unable to obtain due to inadequate arterial access related to peripheral artery disease. Discussed with cardiology here and if troponins become elevated can re-consult. -Trend troponin 3 -Cardiac monitoring -Morphine for chest pain, check bp prior to administration -Aspirin -EKG in the am  Hypotension Patient arrived hypotensive, systolics 80s.  Likely from  nitroglycerin. Blood pressure has improved however patient continues to have soft pressures. She was also given fentanyl in the ED.  She was given fluids in the ED as well. Will monitor -s/p 1L bolus in the ED -[place] - Holding IMDUR  Elevated LFTs Patient had a right upper quadrant ultrasound in the ED without acute abnormality. Parenchymal echogenicity was within normal limits. Will obtain acute hepatitis panel although suspicion low.  -Hepatitis panel  Macrocytic anemia Hemoglobin 11, MCV 106. Will obtain vitamin B and folate levels -vitamin B and folate  Hyperlipidemia -Continue atorvastatin  History of anxiety -Continuing home Buspar   Dispo: Admit patient to Observation with expected length of stay less than 2 midnights.  Signed: Camelia PhenesHoffman, Honestii Marton Ratliff, DO 10/28/2018, 1:21 PM  Pager: [phone]

## 2018-10-28 NOTE — ED Notes (Signed)
Patient transported to CT 

## 2018-10-28 NOTE — ED Provider Notes (Signed)
I assumed care of this patient from Dr. Elesa MassedWard at 7 am.  Please see their note for further details of Hx, PE.  Briefly patient is a 67 y.o. female who presented with chest pain, RUQ abdominal pain. Workup thus far shows elevated liver enzymes. Lipase, delta trop, d-dimer and RUQ u/s awaiting. New t wave inversions laterally.  Lipase within normal limits.  Right upper quadrant ultrasound shows no acute findings.  D-dimer elevated and PE study was obtained that showed no acute pulmonary embolus.  However shows coronary artery disease.  Repeat troponin still within normal limits.  Patient to be admitted for further cardiac rule out.  She has been difficult to catheterize in the past from severe peripheral artery disease as well.  Pain has improved after aspirin.  Patient remained hemodynamically stable throughout my care.  This chart was dictated using voice recognition software.  Despite best efforts to proofread,  errors can occur which can change the documentation meaning.     Virgina NorfolkCuratolo, Royden Bulman, DO 10/28/18 1042

## 2018-10-29 DIAGNOSIS — Z7982 Long term (current) use of aspirin: Secondary | ICD-10-CM | POA: Diagnosis not present

## 2018-10-29 DIAGNOSIS — F17210 Nicotine dependence, cigarettes, uncomplicated: Secondary | ICD-10-CM | POA: Diagnosis present

## 2018-10-29 DIAGNOSIS — I2510 Atherosclerotic heart disease of native coronary artery without angina pectoris: Secondary | ICD-10-CM | POA: Diagnosis not present

## 2018-10-29 DIAGNOSIS — Z8673 Personal history of transient ischemic attack (TIA), and cerebral infarction without residual deficits: Secondary | ICD-10-CM | POA: Diagnosis not present

## 2018-10-29 DIAGNOSIS — R079 Chest pain, unspecified: Secondary | ICD-10-CM | POA: Diagnosis not present

## 2018-10-29 DIAGNOSIS — T463X5A Adverse effect of coronary vasodilators, initial encounter: Secondary | ICD-10-CM | POA: Diagnosis present

## 2018-10-29 DIAGNOSIS — Z880 Allergy status to penicillin: Secondary | ICD-10-CM | POA: Diagnosis not present

## 2018-10-29 DIAGNOSIS — I70203 Unspecified atherosclerosis of native arteries of extremities, bilateral legs: Secondary | ICD-10-CM | POA: Diagnosis present

## 2018-10-29 DIAGNOSIS — I5043 Acute on chronic combined systolic (congestive) and diastolic (congestive) heart failure: Secondary | ICD-10-CM | POA: Diagnosis present

## 2018-10-29 DIAGNOSIS — I441 Atrioventricular block, second degree: Secondary | ICD-10-CM | POA: Diagnosis not present

## 2018-10-29 DIAGNOSIS — R011 Cardiac murmur, unspecified: Secondary | ICD-10-CM | POA: Diagnosis present

## 2018-10-29 DIAGNOSIS — E785 Hyperlipidemia, unspecified: Secondary | ICD-10-CM | POA: Diagnosis present

## 2018-10-29 DIAGNOSIS — R1011 Right upper quadrant pain: Secondary | ICD-10-CM | POA: Diagnosis present

## 2018-10-29 DIAGNOSIS — R062 Wheezing: Secondary | ICD-10-CM | POA: Diagnosis not present

## 2018-10-29 DIAGNOSIS — H547 Unspecified visual loss: Secondary | ICD-10-CM | POA: Diagnosis present

## 2018-10-29 DIAGNOSIS — I25110 Atherosclerotic heart disease of native coronary artery with unstable angina pectoris: Secondary | ICD-10-CM

## 2018-10-29 DIAGNOSIS — R001 Bradycardia, unspecified: Secondary | ICD-10-CM | POA: Diagnosis not present

## 2018-10-29 DIAGNOSIS — Z7189 Other specified counseling: Secondary | ICD-10-CM | POA: Diagnosis not present

## 2018-10-29 DIAGNOSIS — J449 Chronic obstructive pulmonary disease, unspecified: Secondary | ICD-10-CM | POA: Diagnosis present

## 2018-10-29 DIAGNOSIS — I7409 Other arterial embolism and thrombosis of abdominal aorta: Secondary | ICD-10-CM | POA: Diagnosis not present

## 2018-10-29 DIAGNOSIS — I214 Non-ST elevation (NSTEMI) myocardial infarction: Principal | ICD-10-CM

## 2018-10-29 DIAGNOSIS — R740 Nonspecific elevation of levels of transaminase and lactic acid dehydrogenase [LDH]: Secondary | ICD-10-CM | POA: Diagnosis not present

## 2018-10-29 DIAGNOSIS — I110 Hypertensive heart disease with heart failure: Secondary | ICD-10-CM | POA: Diagnosis present

## 2018-10-29 DIAGNOSIS — Z515 Encounter for palliative care: Secondary | ICD-10-CM | POA: Diagnosis not present

## 2018-10-29 DIAGNOSIS — I208 Other forms of angina pectoris: Secondary | ICD-10-CM | POA: Diagnosis not present

## 2018-10-29 DIAGNOSIS — I25119 Atherosclerotic heart disease of native coronary artery with unspecified angina pectoris: Secondary | ICD-10-CM | POA: Diagnosis not present

## 2018-10-29 DIAGNOSIS — F419 Anxiety disorder, unspecified: Secondary | ICD-10-CM | POA: Diagnosis present

## 2018-10-29 DIAGNOSIS — Z8249 Family history of ischemic heart disease and other diseases of the circulatory system: Secondary | ICD-10-CM | POA: Diagnosis not present

## 2018-10-29 DIAGNOSIS — I739 Peripheral vascular disease, unspecified: Secondary | ICD-10-CM | POA: Diagnosis not present

## 2018-10-29 DIAGNOSIS — D539 Nutritional anemia, unspecified: Secondary | ICD-10-CM | POA: Diagnosis present

## 2018-10-29 DIAGNOSIS — R54 Age-related physical debility: Secondary | ICD-10-CM | POA: Diagnosis present

## 2018-10-29 DIAGNOSIS — I959 Hypotension, unspecified: Secondary | ICD-10-CM | POA: Diagnosis present

## 2018-10-29 DIAGNOSIS — I700 Atherosclerosis of aorta: Secondary | ICD-10-CM | POA: Diagnosis present

## 2018-10-29 LAB — COMPREHENSIVE METABOLIC PANEL
ALBUMIN: 3.2 g/dL — AB (ref 3.5–5.0)
ALK PHOS: 123 U/L (ref 38–126)
ALT: 66 U/L — ABNORMAL HIGH (ref 0–44)
AST: 60 U/L — AB (ref 15–41)
Anion gap: 7 (ref 5–15)
BUN: 19 mg/dL (ref 8–23)
CALCIUM: 8.5 mg/dL — AB (ref 8.9–10.3)
CHLORIDE: 112 mmol/L — AB (ref 98–111)
CO2: 20 mmol/L — AB (ref 22–32)
CREATININE: 0.87 mg/dL (ref 0.44–1.00)
GFR calc Af Amer: >60 mL/min (ref >60)
GFR calc non Af Amer: >60 mL/min (ref >60)
GLUCOSE: 102 mg/dL — AB (ref 70–99)
Potassium: 4 mmol/L (ref 3.5–5.1)
SODIUM: 139 mmol/L (ref 135–145)
Total Bilirubin: 0.3 mg/dL (ref 0.3–1.2)
Total Protein: 6 g/dL — ABNORMAL LOW (ref 6.5–8.1)

## 2018-10-29 LAB — CBC
HEMATOCRIT: 34.9 % — AB (ref 36.0–46.0)
Hemoglobin: 10.7 g/dL — ABNORMAL LOW (ref 12.0–15.0)
MCH: 32.3 pg (ref 26.0–34.0)
MCHC: 30.7 g/dL (ref 30.0–36.0)
MCV: 105.4 fL — AB (ref 80.0–100.0)
NRBC: 0 % (ref 0.0–0.2)
Platelets: 224 10*3/uL (ref 150–400)
RBC: 3.31 MIL/uL — ABNORMAL LOW (ref 3.87–5.11)
RDW: 13.8 % (ref 11.5–15.5)
WBC: 7.7 10*3/uL (ref 4.0–10.5)

## 2018-10-29 LAB — HEPARIN LEVEL (UNFRACTIONATED)
Heparin Unfractionated: <0.1 IU/mL — ABNORMAL LOW (ref 0.30–0.70)
Heparin Unfractionated: 0.18 IU/mL — ABNORMAL LOW (ref 0.30–0.70)
Heparin Unfractionated: 0.3 IU/mL (ref 0.30–0.70)

## 2018-10-29 LAB — VITAMIN B12: Vitamin B-12: 307 pg/mL (ref 180–914)

## 2018-10-29 LAB — TROPONIN I
TROPONIN I: 0.03 ng/mL — AB (ref <0.03)
Troponin I: 0.05 ng/mL (ref <0.03)

## 2018-10-29 MED ORDER — HEPARIN BOLUS VIA INFUSION
1500.0000 [IU] | Freq: Once | INTRAVENOUS | Status: AC
  Administered 2018-10-29: 1500 [IU] via INTRAVENOUS
  Filled 2018-10-29: qty 1500

## 2018-10-29 MED ORDER — MORPHINE SULFATE (PF) 2 MG/ML IV SOLN
1.0000 mg | INTRAVENOUS | Status: DC | PRN
  Filled 2018-10-29: qty 1

## 2018-10-29 MED ORDER — CLOPIDOGREL BISULFATE 75 MG PO TABS
75.0000 mg | ORAL_TABLET | Freq: Every day | ORAL | Status: DC
  Administered 2018-10-30 – 2018-11-02 (×4): 75 mg via ORAL
  Filled 2018-10-29 (×4): qty 1

## 2018-10-29 MED ORDER — CLOPIDOGREL BISULFATE 75 MG PO TABS
300.0000 mg | ORAL_TABLET | Freq: Once | ORAL | Status: AC
  Administered 2018-10-29: 300 mg via ORAL
  Filled 2018-10-29: qty 4

## 2018-10-29 MED ORDER — NITROGLYCERIN IN D5W 200-5 MCG/ML-% IV SOLN
0.0000 ug/min | INTRAVENOUS | Status: DC
  Administered 2018-10-29: 5 ug/min via INTRAVENOUS
  Filled 2018-10-29: qty 250

## 2018-10-29 NOTE — Progress Notes (Signed)
ANTICOAGULATION CONSULT NOTE   Pharmacy Consult for Heparin Indication: chest pain/ACS  Allergies  Allergen Reactions  . Penicillins Anaphylaxis    Has patient had a PCN reaction causing immediate rash, facial/tongue/throat swelling, SOB or lightheadedness with hypotension: Yes Has patient had a PCN reaction causing severe rash involving mucus membranes or skin necrosis: No Has patient had a PCN reaction that required hospitalization: Yes Has patient had a PCN reaction occurring within the last 10 years: No If all of the above answers are "NO", then may proceed with Cephalosporin use.     Patient Measurements: Weight: 100 lb 11.2 oz (45.7 kg)  Vital Signs: Temp: 97.7 F (36.5 C) (11/22 1927) Temp Source: Oral (11/22 1927) BP: 141/75 (11/22 1927) Pulse Rate: 78 (11/22 1927)  Labs: Recent Labs    10/28/18 0550 10/28/18 1318 10/28/18 1918 10/29/18 0109  HGB 11.4*  --   --  10.7*  HCT 37.0  --   --  34.9*  PLT 237  --   --  224  HEPARINUNFRC  --   --   --  <0.10*  CREATININE 0.96  --   --  0.87  TROPONINI  --  0.07* 0.05*  --     Estimated Creatinine Clearance: 45.9 mL/min (by C-G formula based on SCr of 0.87 mg/dL).   Medical History: Past Medical History:  Diagnosis Date  . Blindness   . Bradycardia    a. pt reports hx of slow HR.  Marland Kitchen. CAD in native artery    a. pt reports "blockage in the back of her heart" sometime in [facility].  . Carotid artery disease (HCC)    a. pt reports "blockage scraped" R neck artery around 2000.  . Cataract   . Former tobacco use   . Heart murmur   . Hyperlipidemia   . PAD (peripheral artery disease) (HCC)    a. she was told she had poor circulation from the waist down.  . Skipped heart beats   . Stroke [facility](HCC)    a. 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    Medications:  Medications Prior to Admission  Medication Sig Dispense Refill  Last Dose  . acetaminophen (TYLENOL) 325 MG tablet Take 2 tablets (650 mg total) by mouth every 6 (six) hours.   10/28/2018 at Unknown time  . aspirin EC 81 MG EC tablet Take 1 tablet (81 mg total) by mouth daily. 30 tablet 0 10/27/2018 at 0900  . atorvastatin (LIPITOR) 80 MG tablet Take 1 tablet (80 mg total) by mouth daily at 6 PM. 30 tablet 0 10/27/2018 at Unknown time  . busPIRone (BUSPAR) 5 MG tablet Take 10 mg by mouth 3 (three) times daily.   10/27/2018 at Unknown time  . calcium-vitamin D (OSCAL WITH D) 500-200 MG-UNIT tablet Take 1 tablet by mouth daily with breakfast. 30 tablet 0 10/27/2018 at Unknown time  . gabapentin (NEURONTIN) 300 MG capsule Take 1 capsule (300 mg total) by mouth 3 (three) times daily for 7 days. 21 capsule 0 10/28/2018 at Unknown time  . isosorbide mononitrate (IMDUR) 30 MG 24 hr tablet Take 1 tablet (30 mg total) by mouth daily. 30 tablet 0 10/27/2018 at Unknown time  . Melatonin 3 MG TABS Take 3 mg by mouth at bedtime.   10/26/2018  . mirtazapine (REMERON) 7.5 MG tablet Take 1 tablet (7.5 mg total) by  mouth at bedtime. 30 tablet 0 10/26/2018  . nitroGLYCERIN (NITROSTAT) 0.4 MG SL tablet Place 1 tablet (0.4 mg total) under the tongue every 5 (five) minutes as needed for chest pain. 30 tablet 0 unk  . oxybutynin (DITROPAN) 5 MG tablet Take 5 mg by mouth 2 (two) times daily.   10/27/2018 at Unknown time  . pantoprazole (PROTONIX) 20 MG tablet Take 1 tablet (20 mg total) by mouth daily. 30 tablet 0 10/28/2018 at Unknown time  . polyethylene glycol (MIRALAX / GLYCOLAX) packet Take 17 g by mouth daily as needed for mild constipation. 14 each 0 unk  . Misc. Devices (BATH/SHOWER SEAT) MISC Shower seat (Patient not taking: Reported on 10/28/2018) 1 each 0 Not Taking at Unknown time    Assessment: 67 y/o female admitted from SNF for chest pain that was relieved by nitroglycerin. Pharmacy consulted to begin IV heparin. No bleeding noted, Hgb 11.4, platelets are normal.  Troponin 0.03 >> 0.07. No anticoagulation PTA. CTA chest neg for PE.   11/23 AM update: heparin level undetectable, no issues per RN  Goal of Therapy:  Heparin level 0.3-0.7 units/ml Monitor platelets by anticoagulation protocol: Yes   Plan:  Heparin 1500 units [employer] heparin drip to 700 units/hr Re-check heparin level in 6-8 hours Daily heparin level and CBC Monitor for s/sx of bleeding  Abran Duke, PharmD, BCPS Clinical Pharmacist Phone: [phone]

## 2018-10-29 NOTE — Progress Notes (Signed)
   Subjective: Stable overnight.  Chest pain has slightly improved.  Is now complaining of bothersome bilateral leg pain that is similar to her chronic leg pain.  She remains alert and oriented, very talkative and has a good appetite.  We discussed code status and she is very clear in her desire to to remain full code.  She would at least like to be "given a chance" if she got to a point where we did not feel like she would come back, she would want us to discontinue life supporting measures.  She does not have any family to contact.  She does have a caregiver, [name], but she does not know her telephone number.  Objective:  Vital signs in last 24 hours: Vitals:   10/28/18 1616 10/28/18 1647 10/28/18 1927 10/29/18 0413  BP:  123/90 (!) 141/75 96/60  Pulse:  89 78 (!) 42  Resp:  18    Temp:   97.7 F (36.5 C) 98.2 F (36.8 C)  TempSrc:   Oral Oral  SpO2:  98% 97% 94%  Weight: 45.7 kg   46.5 kg   General: Laying comfortably in bed, appears older than stated age Cardiac: Regular rate and rhythm, no murmurs rubs or gallops.  Bilateral carotid bruits left greater than right.  No JVD Extremities: Toes are cold, unable to appreciate DP pulses bilaterally.  No open wounds.  Assessment/Plan:  Principal Problem:   Chest pain Active Problems:   Non-ST elevation MI (NSTEMI) (HCC)   Peripheral vascular occlusive disease (HCC)   Acute on chronic combined systolic and diastolic heart failure (HCC)  67 year old female with CAD, PAD, multiple strokes, blindness, hyperlipidemia who presented with acute onset chest pain, elevated troponins and T wave inversions in the inferior lateral leads consistent with a NSTEMI  1.  N STEMI: Troponin has plateaued at 0.05, repeat EKGs reveal worsening T wave inversions.  She continues to have mild chest pain at rest.  Her blood pressure at the time of my exam was 133/63 with a heart rate ranging from 46-73.  Concerning ST changes were noted on cardiac monitor.   Repeat EKG shows diffuse T wave inversions concerning for severe global ischemia.  CODE STATUS was discussed and she would like to remain full code.  I believe she has a capacity to make this decision. -Cardiology consulted: We appreciate their assistance -Continue IV heparin -Start IV nitroglycerin, load with Plavix -Continued goals of care discussion  2.  Hypotension: Blood pressure improved on my exam, 133/63.  Continue to monitor.  Can give IV fluid bolus if necessary.  Dispo: Anticipated discharge in approximately 1-2 day(s).   [name], Marie S, MD 10/29/2018, 9:40 AM Pager: [phone]

## 2018-10-29 NOTE — Progress Notes (Addendum)
ANTICOAGULATION CONSULT NOTE   Pharmacy Consult for Heparin Indication: chest pain/ACS  Allergies  Allergen Reactions  . Penicillins Anaphylaxis    Has patient had a PCN reaction causing immediate rash, facial/tongue/throat swelling, SOB or lightheadedness with hypotension: Yes Has patient had a PCN reaction causing severe rash involving mucus membranes or skin necrosis: No Has patient had a PCN reaction that required hospitalization: Yes Has patient had a PCN reaction occurring within the last 10 years: No If all of the above answers are "NO", then may proceed with Cephalosporin use.     Patient Measurements: Weight: 102 lb 8.2 oz (46.5 kg)  Vital Signs: Temp: 98.2 F (36.8 C) (11/23 0413) Temp Source: Oral (11/23 0413) BP: 96/68 (11/23 1035) Pulse Rate: 42 (11/23 0413)  Labs: Recent Labs    10/28/18 0550 10/28/18 1318 10/28/18 1918 10/29/18 0109 10/29/18 1129  HGB 11.4*  --   --  10.7*  --   HCT 37.0  --   --  34.9*  --   PLT 237  --   --  224  --   HEPARINUNFRC  --   --   --  <0.10* 0.18*  CREATININE 0.96  --   --  0.87  --   TROPONINI  --  0.07* 0.05* 0.05*  --     Estimated Creatinine Clearance: 46.7 mL/min (by C-G formula based on SCr of 0.87 mg/dL).   Medical History: Past Medical History:  Diagnosis Date  . Blindness   . Bradycardia    a. pt reports hx of slow HR.  Marland Kitchen. CAD in native artery    a. pt reports "blockage in the back of her heart" sometime in [facility].  . Carotid artery disease (HCC)    a. pt reports "blockage scraped" R neck artery around 2000.  . Cataract   . Former tobacco use   . Heart murmur   . Hyperlipidemia   . PAD (peripheral artery disease) (HCC)    a. she was told she had poor circulation from the waist down.  . Skipped heart beats   . Stroke [facility](HCC)    a. 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    Medications:  Medications Prior to  Admission  Medication Sig Dispense Refill Last Dose  . acetaminophen (TYLENOL) 325 MG tablet Take 2 tablets (650 mg total) by mouth every 6 (six) hours.   10/28/2018 at Unknown time  . aspirin EC 81 MG EC tablet Take 1 tablet (81 mg total) by mouth daily. 30 tablet 0 10/27/2018 at 0900  . atorvastatin (LIPITOR) 80 MG tablet Take 1 tablet (80 mg total) by mouth daily at 6 PM. 30 tablet 0 10/27/2018 at Unknown time  . busPIRone (BUSPAR) 5 MG tablet Take 10 mg by mouth 3 (three) times daily.   10/27/2018 at Unknown time  . calcium-vitamin D (OSCAL WITH D) 500-200 MG-UNIT tablet Take 1 tablet by mouth daily with breakfast. 30 tablet 0 10/27/2018 at Unknown time  . gabapentin (NEURONTIN) 300 MG capsule Take 1 capsule (300 mg total) by mouth 3 (three) times daily for 7 days. 21 capsule 0 10/28/2018 at Unknown time  . isosorbide mononitrate (IMDUR) 30 MG 24 hr tablet Take 1 tablet (30 mg total) by mouth daily. 30 tablet 0 10/27/2018 at Unknown time  . Melatonin 3 MG TABS Take 3 mg by mouth at bedtime.  10/26/2018  . mirtazapine (REMERON) 7.5 MG tablet Take 1 tablet (7.5 mg total) by mouth at bedtime. 30 tablet 0 10/26/2018  . nitroGLYCERIN (NITROSTAT) 0.4 MG SL tablet Place 1 tablet (0.4 mg total) under the tongue every 5 (five) minutes as needed for chest pain. 30 tablet 0 unk  . oxybutynin (DITROPAN) 5 MG tablet Take 5 mg by mouth 2 (two) times daily.   10/27/2018 at Unknown time  . pantoprazole (PROTONIX) 20 MG tablet Take 1 tablet (20 mg total) by mouth daily. 30 tablet 0 10/28/2018 at Unknown time  . polyethylene glycol (MIRALAX / GLYCOLAX) packet Take 17 g by mouth daily as needed for mild constipation. 14 each 0 unk  . Misc. Devices (BATH/SHOWER SEAT) MISC Shower seat (Patient not taking: Reported on 10/28/2018) 1 each 0 Not Taking at Unknown time    Assessment: 67 y/o female admitted from SNF for chest pain that was relieved by nitroglycerin. Pharmacy consulted to begin IV heparin.  Troponin  0.03 >> 0.07. No anticoagulation PTA. CTA chest neg for PE.   Heparin level is 0.18, on 700 units/hr. Hgb 10.7, plt 224. No s/sx of bleeding. No infusion issues per nursing.   Goal of Therapy:  Heparin level 0.3-0.7 units/ml Monitor platelets by anticoagulation protocol: Yes   Plan:  Inc heparin drip to 850 units/hr Re-check heparin level in 6 hours Daily heparin level and CBC Monitor for s/sx of bleeding  Sherron Monday, PharmD Clinical Pharmacist  Pager: [phone] Phone: [phone]  ADDENDUM Heparin level came back therapeutic at 0.3, on 850 units/hr. No s/sx of bleeding. No infusion issues. Increase to 900 units/hr and monitor with morning labs.  Sherron Monday, PharmD Clinical Pharmacist

## 2018-10-29 NOTE — Consult Note (Addendum)
Cardiology Consultation:   Patient ID: Mary Nguyen MRN: [id number]; DOB: Dec 16, 1950  Admit date: 10/28/2018 Date of Consult: 10/29/2018  Primary Care Provider: Durenda Hurt, Flossie Buffy, MD Primary Cardiologist: Chrystie Nose, MD   Patient Profile:   Mary Nguyen is a 67 y.o. female with a hx of with a hx of suspected coronary artery disease (known vasculopath), peripheral arterial disease, multiple strokes, blindness, untreated dyslipidemia, significant carotid artery disease, hx of chronic chest pain and bradycardia who is being seen today for the evaluation of chest pain at the request of Dr. Heide Spark.   She has been evaluated on multiple occasions (in [facility] at [facility] and at [facility] in 03/2018) for chest pain without definitive evidence of CAD secondary to severe vasculopath and inability to gain access during heart cath procedure. Unfortunately, heart cath was attempt on 04/29/19however had to be aborted due to poor vascular access. The decision was made to pursue medical management of angina and she was started on low-dose isosorbide mononitrate. No beta-blocker was started secondary to intermittent bradycardia.   Last seen in 08/2018 again for chest pain. Restarted isosorbide, aspirin and atorvastatin.  She never followed up in clinic.  History of Present Illness:   Ms. Mary Nguyen presented with chest pain.  Resident at facility.  Patient had exertional chest pain while working with physical therapy which resolved with rest.  However she woke up from sleep yesterday morning with pain.  She states that she started to having left-sided jaw pain radiating to her chest and then to her shoulder and arm.  It was severe 50 out of 10.  No significant improvement after receiving sublingual nitroglycerin however improved after pain medication.  Then eventually improved after sublingual nitroglycerin in the ER.  Since then she has mild left-sided chest discomfort 2-3/10.  She was  admitted for further evaluation.  Point-of-care troponin 0.03>> 0.05.  Troponin I 0.07>> 0.05 x 2.  Creatinine and potassium level normal.  Hemoglobin 10.7.  CT of chest without PE however shows evidence of CAD and aortic atherosclerosis.  Initial EKG showed sinus rhythm with nonspecific T wave changes in lateral lead-personally reviewed.  However, EKG this morning showed market bradycardia at 39 with significant ST/T wave abnormality globally -personally reviewed.  Patient on IV heparin, aspirin and statin.  Past Medical History:  Diagnosis Date  . Blindness   . Bradycardia    a. pt reports hx of slow HR.  Marland Kitchen CAD in native artery    a. pt reports "blockage in the back of her heart" sometime in 2018 by cath at [facility].  . Carotid artery disease (HCC)    a. pt reports "blockage scraped" R neck artery around 2000.  . Cataract   . Former tobacco use   . Heart murmur   . Hyperlipidemia   . PAD (peripheral artery disease) (HCC)    a. she was told she had poor circulation from the waist down.  . Skipped heart beats   . Stroke [facility])    a. 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    Past Surgical History:  Procedure Laterality Date  . LEFT HEART CATH AND CORONARY ANGIOGRAPHY N/A 04/04/2018   Procedure: LEFT HEART CATH AND CORONARY ANGIOGRAPHY;  Surgeon: Lyn Records, MD;  Location: [facility] INVASIVE CV LAB;  Service: Cardiovascular;  Laterality: N/A;  . TUBAL LIGATION       Inpatient  Medications: Scheduled Meds: . aspirin  324 mg Oral Once  . aspirin EC  81 mg Oral Daily  . atorvastatin  80 mg Oral q1800  . busPIRone  10 mg Oral TID  . gabapentin  300 mg Oral TID  . mirtazapine  7.5 mg Oral QHS  . oxybutynin  5 mg Oral BID  . pantoprazole  20 mg Oral Daily   Continuous Infusions: . heparin 700 Units/hr (10/29/18 0221)   PRN Meds: acetaminophen **OR** acetaminophen, morphine injection,  senna-docusate  Allergies:    Allergies  Allergen Reactions  . Penicillins Anaphylaxis    Has patient had a PCN reaction causing immediate rash, facial/tongue/throat swelling, SOB or lightheadedness with hypotension: Yes Has patient had a PCN reaction causing severe rash involving mucus membranes or skin necrosis: No Has patient had a PCN reaction that required hospitalization: Yes Has patient had a PCN reaction occurring within the last 10 years: No If all of the above answers are "NO", then may proceed with Cephalosporin use.     Social History:   Social History   Socioeconomic History  . Marital status: Divorced    Spouse name: Not on file  . Number of children: Not on file  . Years of education: Not on file  . Highest education level: Not on file  Occupational History  . Not on file  Social Needs  . Financial resource strain: Not on file  . Food insecurity:    Worry: Not on file    Inability: Not on file  . Transportation needs:    Medical: Not on file    Non-medical: Not on file  Tobacco Use  . Smoking status: Current Every Day Smoker    Packs/day: 0.50    Years: 10.00    Pack years: 5.00    Types: Cigarettes  . Smokeless tobacco: Never Used  . Tobacco comment: Pt reports she recently quit after 45 years but family says she still smokes  Substance and Sexual Activity  . Alcohol use: Not Currently  . Drug use: Not Currently  . Sexual activity: Not Currently  Lifestyle  . Physical activity:    Days per week: Not on file    Minutes per session: Not on file  . Stress: Not on file  Relationships  . Social connections:    Talks on phone: Not on file    Gets together: Not on file    Attends religious service: Not on file    Active member of club or organization: Not on file    Attends meetings of clubs or organizations: Not on file    Relationship status: Not on file  . Intimate partner violence:    Fear of current or ex partner: Not on file    Emotionally  abused: Not on file    Physically abused: Not on file    Forced sexual activity: Not on file  Other Topics Concern  . Not on file  Social History Narrative  . Not on file    Family History:    Family History  Problem Relation Age of Onset  . Hypertension Mother   . Heart disease Father        details unclear     ROS:  Please see the history of present illness.  All other ROS reviewed and negative.     Physical Exam/Data:   Vitals:   10/28/18 1616 10/28/18 1647 10/28/18 1927 10/29/18 0413  BP:  123/90 (!) 141/75 96/60  Pulse:  89 78 (!)  42  Resp:  18    Temp:   97.7 F (36.5 C) 98.2 F (36.8 C)  TempSrc:   Oral Oral  SpO2:  98% 97% 94%  Weight: 45.7 kg   46.5 kg    Intake/Output Summary (Last 24 hours) at 10/29/2018 0847 Last data filed at 10/28/2018 1800 Gross per 24 hour  Intake 240 ml  Output -  Net 240 ml   Filed Weights   10/28/18 1616 10/29/18 0413  Weight: 45.7 kg 46.5 kg   Body mass index is 18.75 kg/m.  General: Thin frail female in no acute distress HEENT: normal Lymph: no adenopathy Neck: no JVD Endocrine:  No thryomegaly Vascular: No carotid bruits; FA pulses 2+ bilaterally without bruits  Cardiac:  normal S1, S2; RRR; no murmur  Lungs:  clear to auscultation bilaterally, no wheezing, rhonchi or rales  Abd: soft, nontender, no hepatomegaly  Ext: no edema Musculoskeletal:  No deformities, BUE and BLE strength normal and equal Skin: warm and dry  Neuro:  CNs 2-12 intact, no focal abnormalities noted Psych:  Normal affect   Telemetry:  Telemetry was personally reviewed and demonstrates:  Sinus bradycardia 40-80s  Relevant CV Studies: Echo 03/2018 Study Conclusions  - Left ventricle: The cavity size was normal. Systolic function was   normal. The estimated ejection fraction was in the range of 60%   to 65%. Wall motion was normal; there were no regional wall   motion abnormalities. Doppler parameters are consistent with   abnormal left  ventricular relaxation (grade 1 diastolic   dysfunction). There was no evidence of elevated ventricular   filling pressure by Doppler parameters. - Mitral valve: Calcified annulus. Mildly thickened leaflets .   There was trivial regurgitation. - Left atrium: The atrium was normal in size. - Right ventricle: The cavity size was normal. Wall thickness was   normal. Systolic function was normal. - Right atrium: The atrium was normal in size. - Tricuspid valve: There was mild regurgitation. - Pulmonary arteries: Systolic pressure was within the normal   range. - Inferior vena cava: The vessel was normal in size. - Pericardium, extracardiac: There was no pericardial effusion.  Laboratory Data:  Chemistry Recent Labs  Lab 10/28/18 0550 10/29/18 0109  NA 140 139  K 3.9 4.0  CL 109 112*  CO2 25 20*  GLUCOSE 121* 102*  BUN 25* 19  CREATININE 0.96 0.87  CALCIUM 9.5 8.5*  GFRNONAA >60 >60  GFRAA >60 >60  ANIONGAP 6 7    Recent Labs  Lab 10/28/18 0550 10/29/18 0109  PROT 6.4* 6.0*  ALBUMIN 3.3* 3.2*  AST 46* 60*  ALT 57* 66*  ALKPHOS 135* 123  BILITOT 0.4 0.3   Hematology Recent Labs  Lab 10/28/18 0550 10/29/18 0109  WBC 10.6* 7.7  RBC 3.48* 3.31*  HGB 11.4* 10.7*  HCT 37.0 34.9*  MCV 106.3* 105.4*  MCH 32.8 32.3  MCHC 30.8 30.7  RDW 13.9 13.8  PLT 237 224   Cardiac Enzymes Recent Labs  Lab 10/28/18 1318 10/28/18 1918 10/29/18 0109  TROPONINI 0.07* 0.05* 0.05*    Recent Labs  Lab 10/28/18 0554 10/28/18 0723 10/28/18 0920  TROPIPOC 0.01 0.03 0.05    DDimer  Recent Labs  Lab 10/28/18 0550  DDIMER 1.24*    Radiology/Studies:  Dg Chest 2 View  Result Date: 10/28/2018 CLINICAL DATA:  67 y/o  F; two days of chest pain. EXAM: CHEST - 2 VIEW COMPARISON:  08/30/2018 chest radiograph. FINDINGS: Stable normal cardiac silhouette  given projection and technique. Aortic atherosclerosis with calcification. Stable hyperinflated lungs and mild apical  pleuroparenchymal scarring. No consolidation. No pleural effusion or pneumothorax. Stable T3, T7, T11, and L1 compression deformities. No acute osseous abnormality is evident. IMPRESSION: No acute pulmonary process identified. Electronically Signed   By: Mitzi HansenLance  Furusawa-Stratton M.D.   On: 10/28/2018 06:24   Ct Angio Chest Pe W And/or Wo Contrast  Result Date: 10/28/2018 CLINICAL DATA:  Chest pain radiating to left shoulder.  COPD. EXAM: CT ANGIOGRAPHY CHEST WITH CONTRAST TECHNIQUE: Multidetector CT imaging of the chest was performed using the standard protocol during bolus administration of intravenous contrast. Multiplanar CT image reconstructions and MIPs were obtained to evaluate the vascular anatomy. CONTRAST:  100mL ISOVUE-370 IOPAMIDOL (ISOVUE-370) INJECTION 76% COMPARISON:  08/30/2018 FINDINGS: Cardiovascular: No filling defects in the pulmonary arteries to suggest pulmonary emboli. Mild cardiomegaly. Aorta is normal caliber with scattered moderate aortic and coronary artery atherosclerosis. Mediastinum/Nodes: No mediastinal, hilar, or axillary adenopathy. Lungs/Pleura: Severe bronchial wall thickening diffusely, most pronounced in the lower lobes. Bibasilar atelectasis. No effusions. Upper Abdomen: Imaging into the upper abdomen shows no acute findings. Musculoskeletal: Chest wall soft tissues are unremarkable. No acute bony abnormality. Multiple stable compression fractures in the thoracic spine and upper lumbar spine. Review of the MIP images confirms the above findings. IMPRESSION: No evidence of pulmonary embolus. Cardiomegaly, coronary artery disease and aortic atherosclerosis. Severe diffuse bronchial wall thickening, most pronounced in the lower lobes compatible with bronchitis. Bibasilar atelectasis. Electronically Signed   By: Charlett NoseKevin  Dover M.D.   On: 10/28/2018 10:24   [place] Abdomen Limited Ruq  Result Date: 10/28/2018 CLINICAL DATA:  Upper abdominal pain EXAM: ULTRASOUND ABDOMEN LIMITED  RIGHT UPPER QUADRANT COMPARISON:  CT abdomen and pelvis August 30, 2018 FINDINGS: Gallbladder: No gallstones or wall thickening visualized. There is no pericholecystic fluid. No sonographic Murphy sign noted by sonographer. Common bile duct: Diameter: 3 mm. No intrahepatic or extrahepatic biliary duct dilatation. Liver: No focal lesion identified. Within normal limits in parenchymal echogenicity. Portal vein is patent on color Doppler imaging with normal direction of blood flow towards the liver. IMPRESSION: Study within normal limits. Electronically Signed   By: Bretta BangWilliam  Woodruff III M.D.   On: 10/28/2018 08:40    Assessment and Plan:   1. Unstable angina Her pain started 3 -4 days ago while doing exercise with physical therapy.  Now presenting with resting pain.  EKG this morning showed severe global ischemia.  Continue IV heparin, aspirin and statin.  Avoid beta-blocker given bradycardia.  Loaded with Plavix 300 mg today and then 75 mg daily tomorrow.  Start IV nitro drip.  Previously felt not a good candidate for cardiac catheterization due to vascular access issue. Normal LVEF by echo 03/2018.   2.  Bradycardia -Avoid AV nodal agent.  Currently heart rate stable in 40s to 60s.  3. PVD -Known difficult vascular access, symptomatic -Imaging findings from 08/30/2018 with occlusion of the abdominal aorta at the level of the aortic bifurcation. Bilateral common iliac arteries appear occluded and collateral arteries are seen reconstituting the proximal femoral arteries  4. HLD - 04/02/2018: Cholesterol 227; HDL 41; LDL Cholesterol 173; Triglycerides 65; VLDL 13  - Continue statin. Recheck labs in AM. LDL goal less than 70.  Otherwise per primary team  For questions or updates, please contact [facility] Please consult [email or web address] for contact info under     Lorelei PontSigned, Bhavinkumar Bhagat, PA  10/29/2018 8:47 AM   I have seen and examined the patient  along with Manson Passey, PA.  I  have reviewed the chart, notes and new data.  I agree with PA/NP's note.  Key new complaints: Continues to have angina at rest, 4-6/10.  Not yet started on nitroglycerin IV.  Denies dyspnea. Key examination changes: Bradycardic, regular rate and rhythm with occasional ectopy, clear lungs, no murmurs.  Pedal pulses and radial pulses are thready, barely detectable. Key new findings / data: ECG shows sinus bradycardia with severe and diffuse broad T wave inversion in almost all leads, QTC 590 ms, consistent with global ischemia.  PLAN: There is a high likelihood she has multivessel coronary artery disease, very possibly left main and right coronary stenoses. 2 previous attempts in 2 separate institutions to perform cardiac catheterization have been unsuccessful due to occluded peripheral arteries.  Both radial and femoral approaches had to be aborted.  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 Unfortunately, not a candidate for invasive revascularization or balloon pump. We will try to maximize medical therapy.  Start intravenous nitroglycerin.  Load with clopidogrel.  Continue IV heparin.  Cannot receive beta blockers due to severe bradycardia. Prognosis is very poor.  End of life discussion and palliative care consultation is appropriate.  Thurmon Fair, MD, [facility] [phone] 10/29/2018, 9:20 AM

## 2018-10-29 NOTE — Progress Notes (Signed)
  Date: 10/29/2018  Patient name: Mary Nguyen  Medical record number: [id number]  Date of birth: 1951/01/11   I have seen and evaluated this patient and I have discussed the plan of care with the house staff. Please see their note for complete details. I concur with their findings with the following additions/corrections: Mary Nguyen was seen on morning rounds with Drs. Vogel and symptoms.  For her unstable angina, cardiology is following.  Her blood pressure has now improved to a systolic in the 130s and cardiology is maximizing medical therapy.  She remains on the IV heparin and nitroglycerin is going to be started as an infusion.  Clopidogrel is being started today.  Beta-blockers are contraindicated due to her bradycardia.  Our team started the goals of care discussion.  When asked if she had thought about what she would want if she became critically ill, she was emphatic that she wanted everything done.  When asked to explain, she stated she wanted to get a chance to continue to live.  We then discussed if there is any situation in which she would not want everything done and she was then able to explain that if the physicians felt it was futile that she would want to be allowed to peacefully die. She discussed with that her twin sister had a horrible death.  She also had peripheral vascular disease and had painful open wounds of her lower extremities that extend proximaly.  Her sister was on home hospice and despite opioids, her sister had a very painful death.  Mary Nguyen does not want that for herself.  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  She remains full code now per her wishes but we will need to continue to educate and explore her goals of care decisions.  Dr. Petra KubaVogel has consulted palliative care.  Burns SpainButcher, Lot Medford A, MD 10/29/2018, 2:02 PM

## 2018-10-30 DIAGNOSIS — Z515 Encounter for palliative care: Secondary | ICD-10-CM

## 2018-10-30 DIAGNOSIS — I2510 Atherosclerotic heart disease of native coronary artery without angina pectoris: Secondary | ICD-10-CM

## 2018-10-30 DIAGNOSIS — I441 Atrioventricular block, second degree: Secondary | ICD-10-CM

## 2018-10-30 DIAGNOSIS — Z7189 Other specified counseling: Secondary | ICD-10-CM

## 2018-10-30 DIAGNOSIS — I208 Other forms of angina pectoris: Secondary | ICD-10-CM

## 2018-10-30 LAB — CBC
HCT: 32.8 % — ABNORMAL LOW (ref 36.0–46.0)
Hemoglobin: 10.2 g/dL — ABNORMAL LOW (ref 12.0–15.0)
MCH: 32.5 pg (ref 26.0–34.0)
MCHC: 31.1 g/dL (ref 30.0–36.0)
MCV: 104.5 fL — AB (ref 80.0–100.0)
Platelets: 202 10*3/uL (ref 150–400)
RBC: 3.14 MIL/uL — ABNORMAL LOW (ref 3.87–5.11)
RDW: 13.9 % (ref 11.5–15.5)
WBC: 6.7 10*3/uL (ref 4.0–10.5)
nRBC: 0 % (ref 0.0–0.2)

## 2018-10-30 LAB — COMPREHENSIVE METABOLIC PANEL
ALK PHOS: 135 U/L — AB (ref 38–126)
ALT: 93 U/L — ABNORMAL HIGH (ref 0–44)
AST: 75 U/L — AB (ref 15–41)
Albumin: 2.9 g/dL — ABNORMAL LOW (ref 3.5–5.0)
Anion gap: 6 (ref 5–15)
BILIRUBIN TOTAL: 0.4 mg/dL (ref 0.3–1.2)
BUN: 18 mg/dL (ref 8–23)
CO2: 22 mmol/L (ref 22–32)
Calcium: 8.5 mg/dL — ABNORMAL LOW (ref 8.9–10.3)
Chloride: 112 mmol/L — ABNORMAL HIGH (ref 98–111)
Creatinine, Ser: 0.83 mg/dL (ref 0.44–1.00)
GFR calc Af Amer: >60 mL/min (ref >60)
GFR calc non Af Amer: >60 mL/min (ref >60)
Glucose, Bld: 113 mg/dL — ABNORMAL HIGH (ref 70–99)
Potassium: 3.8 mmol/L (ref 3.5–5.1)
Sodium: 140 mmol/L (ref 135–145)
TOTAL PROTEIN: 5.6 g/dL — AB (ref 6.5–8.1)

## 2018-10-30 LAB — FOLATE RBC
FOLATE, HEMOLYSATE: 319.9 ng/mL
FOLATE, RBC: 1000 ng/mL (ref >498)
HEMATOCRIT: 32 % — AB (ref 34.0–46.6)

## 2018-10-30 LAB — HEPARIN LEVEL (UNFRACTIONATED): Heparin Unfractionated: 0.37 IU/mL (ref 0.30–0.70)

## 2018-10-30 MED ORDER — RANOLAZINE ER 500 MG PO TB12
500.0000 mg | ORAL_TABLET | Freq: Two times a day (BID) | ORAL | Status: DC
  Administered 2018-10-30 – 2018-11-02 (×7): 500 mg via ORAL
  Filled 2018-10-30 (×7): qty 1

## 2018-10-30 MED ORDER — MORPHINE SULFATE (PF) 2 MG/ML IV SOLN
2.0000 mg | INTRAVENOUS | Status: DC | PRN
  Administered 2018-10-30: 2 mg via INTRAVENOUS
  Administered 2018-10-30: 1 mg via INTRAVENOUS
  Administered 2018-10-31 – 2018-11-01 (×6): 2 mg via INTRAVENOUS
  Filled 2018-10-30 (×7): qty 1

## 2018-10-30 MED ORDER — ISOSORBIDE DINITRATE 10 MG PO TABS
5.0000 mg | ORAL_TABLET | Freq: Three times a day (TID) | ORAL | Status: DC
  Administered 2018-10-30 – 2018-10-31 (×3): 5 mg via ORAL
  Filled 2018-10-30 (×3): qty 1

## 2018-10-30 MED ORDER — CARVEDILOL 3.125 MG PO TABS
3.1250 mg | ORAL_TABLET | Freq: Two times a day (BID) | ORAL | Status: DC
  Administered 2018-10-30 – 2018-11-02 (×5): 3.125 mg via ORAL
  Filled 2018-10-30 (×6): qty 1

## 2018-10-30 NOTE — Progress Notes (Signed)
  Date: 10/30/2018  Patient name: Mary Nguyen  Medical record number: [id number]  Date of birth: August 08, 1951   I have seen and evaluated this patient and I have discussed the plan of care with the house staff. Please see their note for complete details. I concur with their findings with the following additions/corrections: This is seen on morning rounds with the team.  She complained of chest pain and back pain.  Uncomfortable.  Her blood pressure had improved after the nitroglycerin was stopped overnight due to hypo-tension.  She remains on a heparin drip.  We continued our goals of care discussion and I expressed that I was concerned about the amount of pain that she was in, our inability to reverse her underlying heart disease, and are inability to use other pharmaceutical medications like nitroglycerin to give her relief.  We discussed that yesterday she stated she wanted everything done to be given a chance.  We discussed that we have been doing everything that we are not making progress and she remains in severe pain.  I tried to use the correlate of her sister having a bad death and being in severe pain and Ms. Brosseau understood that but is not yet ready to embrace comfort care and her death.  We offered and she accepted a visit from the chaplain.  We discussed that we would try focusing on comfort for the next 4 to 6 hours with morphine regardless of her blood pressure so that she can get a sense of what it would be like to be comfortable and then re-visit the issue with her.  She got 1 mg of morphine at 9 AM and an additional 2 mg at 2 PM.  This will need to be an ongoing discussion juggling her pain with her not yet being willing to accept futility.  We will focus on pain control at this time but remains full scope of treatment including CPR, ALS, and intubation.  Burns SpainButcher, Landra Howze A, MD 10/30/2018, 2:26 PM

## 2018-10-30 NOTE — Progress Notes (Signed)
   10/30/18 1400  Clinical Encounter Type  Visited With Patient;Family  Visit Type Initial  Referral From Nurse  Consult/Referral To Chaplain  Spiritual Encounters  Spiritual Needs Prayer;Grief support;Emotional  Stress Factors  Patient Stress Factors Family relationships;Health changes;Major life changes  Family Stress Factors Other (Comment)   Responded to spiritual care consult. PT was alert and care giver was at bedside. PT was elated for the chaplain visit. PT mentioned she has not been able to talk to her son as she has repeatedly tried to contact him. Care giver was also concerned about contacting him, stating that she left messages with him. I offered spiritual care with words of encouragement, a listening ear and prayer. Chaplain available as needed.   Chaplain Orest DikesAbel Jace Fermin [phone]

## 2018-10-30 NOTE — Progress Notes (Signed)
   Subjective: IV nitroglycerin was discontinued due to hypotension.  Ms. Mary Nguyen looks very uncomfortable this morning, dyspneic and complaining of pain in her chest and low back.  She states that her pain prevented her from sleeping all night and Tylenol provided minimal relief.  We again discussed her wishes and she remains full code but seems to be processing the fact that our medical interventions are not helping her.  We discussed giving her morphine and focusing more on comfort.  She seemed open to this idea but not yet ready to commit to complete comfort care.  She would like to get in touch with her son, but unfortunately the number we have on file does not seem to work and she does not know what his number would be.  We offered a chaplain and she said yes.  I was able to speak with her caregiver, Mary Nguyen, who will come visit her today.  Objective:  Vital signs in last 24 hours: Vitals:   10/30/18 0517 10/30/18 0542 10/30/18 0549 10/30/18 0552  BP: (!) 78/48 (!) 93/44 118/87 110/67  Pulse:    (!) 56  Resp: 13 15 16    Temp:    97.7 F (36.5 C)  TempSrc:    Oral  SpO2:    96%  Weight:    46.9 kg   General: Laying in bed, notably dyspneic and uncomfortable.  Complaining of chest and back pain.  Blood pressure 163/99 Cardiac.  Regular rate and rhythm, no murmurs rubs or gallops, neck veins are nondistended Pulmonary: Audible wheezing, increased work of breathing   Assessment/Plan:  Principal Problem:   Non-ST elevation MI (NSTEMI) (HCC) Active Problems:   Peripheral vascular occlusive disease (HCC)   Acute on chronic combined systolic and diastolic heart failure (HCC)  67 year old female with CAD, PAD, multiple strokes, blindness, hyperlipidemia who presented with acute onset chest pain, elevated troponins and T wave inversions in the inferior lateral leads consistent with a NSTEMI  1. NSTEMI: Blood pressure has improved.  Continues to have chest pain, now dyspneic.  EKG this  morning with prolonged QTC, diffuse T wave inversions with ST depressions, and new second-degree AV block.  Given IV morphine while in the room which helped with chest pain and air hunger.  Prognosis is poor.  Contacted caregiver, Mary Nguyen, who will come to visit patient today.  Unable to get in touch with the son. -Encouraged patient to ask for morphine when she needed it -Consult to spiritual care -Consult palliative -Cardiology consulted: Starting low-dose carvedilol, oral nitrates, Ranexa -Continue to goals of care discussion, patient remains full code at this time  Dispo: Anticipated discharge in approximately 2 day(s).   Mary Nguyen,  S, MD 10/30/2018, 12:28 PM Pager: [phone]

## 2018-10-30 NOTE — Consult Note (Signed)
Consultation Note Date: 10/30/2018   Patient Name: Mary Nguyen  DOB: 08-07-1951  MRN: [id number]  Age / Sex: 67 y.o., female  PCP: Mary Iba, MD Referring Physician: Bartholomew Crews, MD  Reason for Consultation: Establishing goals of care  HPI/Patient Profile: 67 y.o. female  with past medical history of unstable angina, CVAx 4, HLD, blindness, PAD (no suitable artery for heart cath), CAD, bradycardia, T7 compression fracture after a fall in May and current smoker admitted on 10/28/2018 with acute onset of chest pain at rest. She was in her usual state of health until approximately 4 to 5 days ago when, during physical therapy, she developed chest pain. She has had hypotension with nitroglycerin. Her chest pain continues - reports improvement after morphine. Per chart review, it appears primary team has had multiple goals of care conversations and patient wants to remain full code. PMT consulted for [place].  Clinical Assessment and Goals of Care: I have reviewed medical records including EPIC notes, labs and imaging, assessed the patient and then met with patient and her caregiver, Mary Nguyen,  to discuss diagnosis prognosis, GOC, EOL wishes, disposition and options.  I met with patient in September to discuss goals of care - at that time she was adamant that she wanted to remain a full code and go to rehab. She is also followed by outpatient palliative.  I reintroduced Palliative Medicine as specialized medical care for people living with serious illness. It focuses on providing relief from the symptoms and stress of a serious illness. The goal is to improve quality of life for both the patient and the family.  We discussed a brief life review of the patient again. 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 She lived most of her life in [place]. She is divorced. Has one living son. Had another son who died in a MVA over 60 years  ago.  As far as functional and nutritional status, she requires assistance for bathing and toileting. She is unable to ambulate without assistance and spends most of her time in bed or in her wheelchair. She has been working with physical therapy at rehab. She does tell me she has a great appetite and attributes it to remeron.    We discussed her current illness and what it means in the larger context of her on-going co-morbidities.  Natural disease trajectory and expectations at EOL were discussed.We discussed her severe cardiac disease and limited medical interventions. We discussed that her heart disease is irreversible. We also discussed the worry that we cannot use nitroglycerin d/t hypotension.  I attempted to elicit values and goals of care important to the patient. She tells me it is most important to her to not suffer and secondly she hopes to speak with her son.  Apparently, multiple members of the medical team and the patient's caregiver, Mary Nguyen, have attempted to reach the son.   The difference between aggressive medical intervention and comfort care was considered in light of the patient's goals of care. We discussed that aggressive medical interventions are limited to medications and she is responding poorly to nitroglycerin. We discussed what focusing on her comfort would look like. She tells me that morphine was withheld overnight and I attempted to explain that this was because her BP was so low and since her focus continues to be aggressive care and not comfort that is why the medical team made the decision to hold morphine. She did not seem to grasp this.  Advance directives, concepts specific to code status, artifical feeding and hydration, and rehospitalization were considered and discussed. We discussed code status at length. I explained that changing her code status only changed how the medical team responded if she died. We discussed why the medical recommendation is DNR given her  severe cardiac disease and limited interventions.  She shared with me about her sister's death and how she felt that her sister suffered at the end of life. We discussed how we could attempt to avoid this by focusing on her comfort.   Ms. Mary Nguyen remained reluctant to give a decision about her code status and continued to tell me it was a hard decision and she would think about it. I shared that she would remain a full code for now. Ms. Mary Nguyen did seem more receptive and open to this conversation than she did during my visit during a previous hospitalization. Ms. Mary Nguyen agrees to speak to PMT provider again tomorrow to continue goals of care discussions.   Questions and concerns were addressed. The patient and caregiver were encouraged to call with questions or concerns.   Primary Decision Maker PATIENT    SUMMARY OF RECOMMENDATIONS   Continue full code/full scope Patient receptive to Mary Nguyen discussion and agrees to continue discussion with PMT provider tomorrow  Code Status/Advance Care Planning:  Full code   Symptom Management:   Continue prn morphine for chest pain - per primary  Palliative Prophylaxis:   Bowel Regimen and Frequent Pain Assessment  Additional Recommendations (Limitations, Scope, Preferences):  Full Scope Treatment  Psycho-social/Spiritual:   Desire for further Chaplaincy support:yes  Additional Recommendations: Education on Hospice  Prognosis:   Unable to determine - poor prognosis r/t severe cardiac disease and limited management options  Discharge Planning: To Be Determined      Primary Diagnoses: Present on Admission: . Acute on chronic combined systolic and diastolic heart failure ([place])   I have reviewed the medical record, interviewed the patient and family, and examined the patient. The following aspects are pertinent.  Past Medical History:  Diagnosis Date  . Blindness   . Bradycardia    a. pt reports hx of slow HR.  Marland Kitchen CAD in  native artery    a. pt reports "blockage in the back of her heart" sometime in 2018 by cath at [facility].  . Carotid artery disease ([place])    a. pt reports "blockage scraped" R neck artery around 2000.  . Cataract   . Former tobacco use   . Heart murmur   . Hyperlipidemia   . PAD (peripheral artery disease) ([place])    a. she was told she had poor circulation from the waist down.  . Skipped heart beats   . Stroke [facility])    a. 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   Social History   Socioeconomic History  . Marital status: Divorced    Spouse name: Not on file  . Number of children: Not on file  . Years of education: Not on file  . Highest education level: Not on file  Occupational History  . Not on file  Social Needs  . Financial resource strain: Not on file  . Food insecurity:    Worry: Not on file    Inability: Not on file  . Transportation needs:    Medical: Not on file    Non-medical: Not on file  Tobacco  Use  . Smoking status: Current Every Day Smoker    Packs/day: 0.50    Years: 10.00    Pack years: 5.00    Types: Cigarettes  . Smokeless tobacco: Never Used  . Tobacco comment: Pt reports she recently quit after 29 years but family says she still smokes  Substance and Sexual Activity  . Alcohol use: Not Currently  . Drug use: Not Currently  . Sexual activity: Not Currently  Lifestyle  . Physical activity:    Days per week: Not on file    Minutes per session: Not on file  . Stress: Not on file  Relationships  . Social connections:    Talks on phone: Not on file    Gets together: Not on file    Attends religious service: Not on file    Active member of club or organization: Not on file    Attends meetings of clubs or organizations: Not on file    Relationship status: Not on file  Other Topics Concern  . Not on file  Social History Narrative  . Not on file   Family History    Problem Relation Age of Onset  . Hypertension Mother   . Heart disease Father        details unclear   Scheduled Meds: . aspirin EC  81 mg Oral Daily  . atorvastatin  80 mg Oral q1800  . busPIRone  10 mg Oral TID  . carvedilol  3.125 mg Oral BID WC  . clopidogrel  75 mg Oral Daily  . gabapentin  300 mg Oral TID  . isosorbide dinitrate  5 mg Oral TID  . mirtazapine  7.5 mg Oral QHS  . oxybutynin  5 mg Oral BID  . pantoprazole  20 mg Oral Daily  . ranolazine  500 mg Oral BID   Continuous Infusions: . heparin 900 Units/hr (10/29/18 2158)  . nitroGLYCERIN Stopped (10/29/18 2252)   PRN Meds:.acetaminophen **OR** acetaminophen, morphine injection, senna-docusate Allergies  Allergen Reactions  . Penicillins Anaphylaxis    Has patient had a PCN reaction causing immediate rash, facial/tongue/throat swelling, SOB or lightheadedness with hypotension: Yes Has patient had a PCN reaction causing severe rash involving mucus membranes or skin necrosis: No Has patient had a PCN reaction that required hospitalization: Yes Has patient had a PCN reaction occurring within the last 10 years: No If all of the above answers are "NO", then may proceed with Cephalosporin use.    Review of Systems  All other systems reviewed and are negative.   Physical Exam  Constitutional: She is oriented to person, place, and time. She appears cachectic. No distress.  Frail, temporal wasting  Cardiovascular: Normal rate and regular rhythm.  Pulmonary/Chest: Effort normal and breath sounds normal. No accessory muscle usage. No tachypnea. No respiratory distress.  Abdominal: Soft. Bowel sounds are normal.  Neurological: She is alert and oriented to person, place, and time.  Psychiatric: She has a normal mood and affect. Her speech is normal and behavior is normal. She is attentive.   Vital Signs: BP (!) 100/56 (BP Location: Left Arm)   Pulse 68   Temp (!) 97.5 F (36.4 C) (Oral)   Resp 19   Wt 46.9 kg    SpO2 96%   BMI 18.91 kg/m  Pain Scale: 0-10   Pain Score: 8    SpO2: SpO2: 96 % O2 Device:SpO2: 96 % O2 Flow Rate: .   IO: Intake/output summary: No intake or output data in the 24  hours ending 10/30/18 1642  LBM: Last BM Date: 10/29/18 Baseline Weight: Weight: 45.7 kg Most recent weight: Weight: 46.9 kg     Palliative Assessment/Data: PPS 40%    Time Total: 70 minutes Greater than 50%  of this time was spent counseling and coordinating care related to the above assessment and plan.  Juel Burrow, DNP, AGNP-C Palliative Medicine Team [phone] Pager: [phone]

## 2018-10-30 NOTE — Progress Notes (Signed)
Progress Note  Patient Name: Mary Nguyen Date of Encounter: 10/30/2018  Primary Cardiologist: Chrystie Nose, MD   Subjective   Still describes chest pressure, but looks more comfortable than yesterday. Morphine helps. BP and HR are now in normal range, iv NTG stopped. Profound T wave inversions and QT prolongation are still present.  Inpatient Medications    Scheduled Meds: . aspirin EC  81 mg Oral Daily  . atorvastatin  80 mg Oral q1800  . busPIRone  10 mg Oral TID  . carvedilol  3.125 mg Oral BID WC  . clopidogrel  75 mg Oral Daily  . gabapentin  300 mg Oral TID  . isosorbide dinitrate  5 mg Oral TID  . mirtazapine  7.5 mg Oral QHS  . oxybutynin  5 mg Oral BID  . pantoprazole  20 mg Oral Daily  . ranolazine  500 mg Oral BID   Continuous Infusions: . heparin 900 Units/hr (10/29/18 2158)  . nitroGLYCERIN Stopped (10/29/18 2252)   PRN Meds: acetaminophen **OR** acetaminophen, morphine injection, senna-docusate   Vital Signs    Vitals:   10/30/18 0517 10/30/18 0542 10/30/18 0549 10/30/18 0552  BP: (!) 78/48 (!) 93/44 118/87 110/67  Pulse:    (!) 56  Resp: 13 15 16    Temp:    97.7 F (36.5 C)  TempSrc:    Oral  SpO2:    96%  Weight:    46.9 kg    Intake/Output Summary (Last 24 hours) at 10/30/2018 1035 Last data filed at 10/29/2018 1400 Gross per 24 hour  Intake 480 ml  Output 900 ml  Net -420 ml   Filed Weights   10/28/18 1616 10/29/18 0413 10/30/18 0552  Weight: 45.7 kg 46.5 kg 46.9 kg    Telemetry    SR, PACs - Personally Reviewed  ECG    SR, PACs, very deep and broad T wave inversions difussely, severely prolonged QTc 570 ms - Personally Reviewed  Physical Exam  Very lean. Looks anxious, but less so than yesterday GEN: No acute distress.   Neck: No JVD Cardiac: RRR, no murmurs, rubs, or gallops.  Respiratory: Clear to auscultation bilaterally. GI: Soft, nontender, non-distended  MS: No edema; No deformity. Neuro:  Nonfocal    Psych: Normal affect   Labs    Chemistry Recent Labs  Lab 10/28/18 0550 10/29/18 0109 10/30/18 0312  NA 140 139 140  K 3.9 4.0 3.8  CL 109 112* 112*  CO2 25 20* 22  GLUCOSE 121* 102* 113*  BUN 25* 19 18  CREATININE 0.96 0.87 0.83  CALCIUM 9.5 8.5* 8.5*  PROT 6.4* 6.0* 5.6*  ALBUMIN 3.3* 3.2* 2.9*  AST 46* 60* 75*  ALT 57* 66* 93*  ALKPHOS 135* 123 135*  BILITOT 0.4 0.3 0.4  GFRNONAA >60 >60 >60  GFRAA >60 >60 >60  ANIONGAP 6 7 6      Hematology Recent Labs  Lab 10/28/18 0550 10/29/18 0109 10/30/18 0312  WBC 10.6* 7.7 6.7  RBC 3.48* 3.31* 3.14*  HGB 11.4* 10.7* 10.2*  HCT 37.0 34.9* 32.8*  MCV 106.3* 105.4* 104.5*  MCH 32.8 32.3 32.5  MCHC 30.8 30.7 31.1  RDW 13.9 13.8 13.9  PLT 237 224 202    Cardiac Enzymes Recent Labs  Lab 10/28/18 1318 10/28/18 1918 10/29/18 0109 10/29/18 1129  TROPONINI 0.07* 0.05* 0.05* 0.03*    Recent Labs  Lab 10/28/18 0554 10/28/18 0723 10/28/18 0920  TROPIPOC 0.01 0.03 0.05     BNPNo results for  input(s): BNP, PROBNP in the last 168 hours.   DDimer  Recent Labs  Lab 10/28/18 0550  DDIMER 1.24*     Radiology    No results found.  Cardiac Studies   Echo 03/2018 Study Conclusions  - Left ventricle: The cavity size was normal. Systolic function was normal. The estimated ejection fraction was in the range of 60% to 65%. Wall motion was normal; there were no regional wall motion abnormalities. Doppler parameters are consistent with abnormal left ventricular relaxation (grade 1 diastolic dysfunction). There was no evidence of elevated ventricular filling pressure by Doppler parameters. - Mitral valve: Calcified annulus. Mildly thickened leaflets . There was trivial regurgitation. - Left atrium: The atrium was normal in size. - Right ventricle: The cavity size was normal. Wall thickness was normal. Systolic function was normal. - Right atrium: The atrium was normal in size. - Tricuspid  valve: There was mild regurgitation. - Pulmonary arteries: Systolic pressure was within the normal range. - Inferior vena cava: The vessel was normal in size. - Pericardium, extracardiac: There was no pericardial effusion.  Patient Profile     67 y.o. female with unstable angina and presumed severe CAD (unable to perform cardiac cath due to lack of vascular access), aorto-iliac occlusion, hypercholesterolemia  Assessment & Plan    Unfortunately, no revascularization is possible. Try to find an effective antianginal regimen with oral meds. Improved HR/BP today offer an opportunity to start low dose carvedilol and oral nitrates. Ranexa started also. Continue antiplatelet agents and statin. Prognosis is grim and from a medical point of view palliative care and hospice are appropriate paths of treatment. She is not ready yet psychologically, although I think she understands the gravity of her condition.     For questions or updates, please contact [facility] Please consult [email or web address] for contact info under        Signed, Thurmon FairMihai Ketzaly Cardella, MD  10/30/2018, 10:35 AM

## 2018-10-30 NOTE — Progress Notes (Signed)
ANTICOAGULATION CONSULT NOTE   Pharmacy Consult for Heparin Indication: chest pain/ACS  Allergies  Allergen Reactions  . Penicillins Anaphylaxis    Has patient had a PCN reaction causing immediate rash, facial/tongue/throat swelling, SOB or lightheadedness with hypotension: Yes Has patient had a PCN reaction causing severe rash involving mucus membranes or skin necrosis: No Has patient had a PCN reaction that required hospitalization: Yes Has patient had a PCN reaction occurring within the last 10 years: No If all of the above answers are "NO", then may proceed with Cephalosporin use.     Patient Measurements: Weight: 103 lb 6.3 oz (46.9 kg)  Vital Signs: Temp: 97.7 F (36.5 C) (11/24 0552) Temp Source: Oral (11/24 0552) BP: 110/67 (11/24 0552) Pulse Rate: 56 (11/24 0552)  Labs: Recent Labs    10/28/18 0550  10/28/18 1918  10/29/18 0109 10/29/18 1129 10/29/18 1930 10/30/18 0312  HGB 11.4*  --   --   --  10.7*  --   --  10.2*  HCT 37.0  --   --   --  34.9*  --   --  32.8*  PLT 237  --   --   --  224  --   --  202  HEPARINUNFRC  --   --   --    < > <0.10* 0.18* 0.30 0.37  CREATININE 0.96  --   --   --  0.87  --   --  0.83  TROPONINI  --    < > 0.05*  --  0.05* 0.03*  --   --    < > = values in this interval not displayed.    Estimated Creatinine Clearance: 49.4 mL/min (by C-G formula based on SCr of 0.83 mg/dL).   Medical History: Past Medical History:  Diagnosis Date  . Blindness   . Bradycardia    a. pt reports hx of slow HR.  Marland Kitchen. CAD in native artery    a. pt reports "blockage in the back of her heart" sometime in [facility].  . Carotid artery disease ([facility])    a. pt reports "blockage scraped" R neck artery around 2000.  . Cataract   . Former tobacco use   . Heart murmur   . Hyperlipidemia   . PAD (peripheral artery disease) ([facility])    a. she was told she had poor circulation from the waist down.  . Skipped heart beats   . Stroke [facility])    a. 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    Medications:  Medications Prior to Admission  Medication Sig Dispense Refill Last Dose  . acetaminophen (TYLENOL) 325 MG tablet Take 2 tablets (650 mg total) by mouth every 6 (six) hours.   10/28/2018 at Unknown time  . aspirin EC 81 MG EC tablet Take 1 tablet (81 mg total) by mouth daily. 30 tablet 0 10/27/2018 at 0900  . atorvastatin (LIPITOR) 80 MG tablet Take 1 tablet (80 mg total) by mouth daily at 6 PM. 30 tablet 0 10/27/2018 at Unknown time  . busPIRone (BUSPAR) 5 MG tablet Take 10 mg by mouth 3 (three) times daily.   10/27/2018 at Unknown time  . calcium-vitamin D (OSCAL WITH D) 500-200 MG-UNIT tablet Take 1 tablet by mouth daily with breakfast. 30 tablet 0 10/27/2018 at Unknown time  . gabapentin (NEURONTIN) 300 MG capsule Take 1 capsule (300  mg total) by mouth 3 (three) times daily for 7 days. 21 capsule 0 10/28/2018 at Unknown time  . isosorbide mononitrate (IMDUR) 30 MG 24 hr tablet Take 1 tablet (30 mg total) by mouth daily. 30 tablet 0 10/27/2018 at Unknown time  . Melatonin 3 MG TABS Take 3 mg by mouth at bedtime.   10/26/2018  . mirtazapine (REMERON) 7.5 MG tablet Take 1 tablet (7.5 mg total) by mouth at bedtime. 30 tablet 0 10/26/2018  . nitroGLYCERIN (NITROSTAT) 0.4 MG SL tablet Place 1 tablet (0.4 mg total) under the tongue every 5 (five) minutes as needed for chest pain. 30 tablet 0 unk  . oxybutynin (DITROPAN) 5 MG tablet Take 5 mg by mouth 2 (two) times daily.   10/27/2018 at Unknown time  . pantoprazole (PROTONIX) 20 MG tablet Take 1 tablet (20 mg total) by mouth daily. 30 tablet 0 10/28/2018 at Unknown time  . polyethylene glycol (MIRALAX / GLYCOLAX) packet Take 17 g by mouth daily as needed for mild constipation. 14 each 0 unk  . Misc. Devices (BATH/SHOWER SEAT) MISC Shower seat (Patient not taking: Reported on 10/28/2018) 1 each 0 Not Taking at Unknown  time    Assessment: 67 y/o female admitted from SNF for chest pain that was relieved by nitroglycerin. Pharmacy consulted to begin IV heparin.  Troponin 0.03 >> 0.07. No anticoagulation PTA. CTA chest neg for PE.   Heparin level is 0.37, on 900 units/hr. Hgb 10.2, plt 202. No s/sx of bleeding. No infusion issues per nursing.   Goal of Therapy:  Heparin level 0.3-0.7 units/ml Monitor platelets by anticoagulation protocol: Yes   Plan:  Continue heparin drip at 900 units/hr Daily heparin level and CBC Monitor for s/sx of bleeding  Sherron Monday, PharmD Clinical Pharmacist  Pager: [phone] Phone: [phone]

## 2018-10-31 DIAGNOSIS — I25119 Atherosclerotic heart disease of native coronary artery with unspecified angina pectoris: Secondary | ICD-10-CM

## 2018-10-31 DIAGNOSIS — Z7189 Other specified counseling: Secondary | ICD-10-CM

## 2018-10-31 DIAGNOSIS — Z9861 Coronary angioplasty status: Secondary | ICD-10-CM

## 2018-10-31 LAB — CBC
HEMATOCRIT: 31.3 % — AB (ref 36.0–46.0)
HEMOGLOBIN: 9.9 g/dL — AB (ref 12.0–15.0)
MCH: 33 pg (ref 26.0–34.0)
MCHC: 31.6 g/dL (ref 30.0–36.0)
MCV: 104.3 fL — AB (ref 80.0–100.0)
NRBC: 0 % (ref 0.0–0.2)
Platelets: 191 10*3/uL (ref 150–400)
RBC: 3 MIL/uL — AB (ref 3.87–5.11)
RDW: 13.9 % (ref 11.5–15.5)
WBC: 7.4 10*3/uL (ref 4.0–10.5)

## 2018-10-31 LAB — HEPATITIS PANEL, ACUTE
HCV Ab: <0.1 s/co ratio (ref 0.0–0.9)
HEP B S AG: NEGATIVE
Hep A IgM: NEGATIVE
Hep B C IgM: NEGATIVE

## 2018-10-31 LAB — HEPARIN LEVEL (UNFRACTIONATED): Heparin Unfractionated: 0.24 IU/mL — ABNORMAL LOW (ref 0.30–0.70)

## 2018-10-31 LAB — HEPATITIS B CORE ANTIBODY, TOTAL: Hep B Core Total Ab: NEGATIVE

## 2018-10-31 LAB — HEPATITIS B SURFACE ANTIBODY, QUANTITATIVE: HEPATITIS B-POST: 3.6 m[IU]/mL — AB

## 2018-10-31 MED ORDER — ISOSORBIDE DINITRATE 10 MG PO TABS
10.0000 mg | ORAL_TABLET | Freq: Three times a day (TID) | ORAL | Status: DC
  Administered 2018-10-31 – 2018-11-02 (×6): 10 mg via ORAL
  Filled 2018-10-31 (×6): qty 1

## 2018-10-31 NOTE — Progress Notes (Signed)
Progress Note  Patient Name: Bryssa Tones Date of Encounter: 10/31/2018  Primary Cardiologist:   Chrystie Nose, MD   Subjective   The patient reports that she continues to have chest pain off an on that improves after morphine.  She is also reporting right lower quadrant abdominal pain.   Inpatient Medications    Scheduled Meds: . aspirin EC  81 mg Oral Daily  . atorvastatin  80 mg Oral q1800  . busPIRone  10 mg Oral TID  . carvedilol  3.125 mg Oral BID WC  . clopidogrel  75 mg Oral Daily  . gabapentin  300 mg Oral TID  . isosorbide dinitrate  5 mg Oral TID  . mirtazapine  7.5 mg Oral QHS  . oxybutynin  5 mg Oral BID  . pantoprazole  20 mg Oral Daily  . ranolazine  500 mg Oral BID   Continuous Infusions: . heparin 900 Units/hr (10/31/18 0031)  . nitroGLYCERIN Stopped (10/29/18 2252)   PRN Meds: acetaminophen **OR** acetaminophen, morphine injection, senna-docusate   Vital Signs    Vitals:   10/30/18 1409 10/30/18 1640 10/31/18 0519 10/31/18 0856  BP: (!) 147/70 (!) 100/56 136/63 136/84  Pulse:  68 65 74  Resp: (!) 31 19 13    Temp:  (!) 97.5 F (36.4 C) 97.6 F (36.4 C)   TempSrc:  Oral Oral   SpO2:      Weight:        Intake/Output Summary (Last 24 hours) at 10/31/2018 1130 Last data filed at 10/30/2018 1700 Gross per 24 hour  Intake 840 ml  Output 1100 ml  Net -260 ml   Filed Weights   10/28/18 1616 10/29/18 0413 10/30/18 0552  Weight: 45.7 kg 46.5 kg 46.9 kg    Telemetry    NSR, PACs - Personally Reviewed  ECG    NA - Personally Reviewed  Physical Exam   GEN: No acute distress.  Frail Neck: No  JVD Cardiac: RRR, no murmurs, rubs, or gallops.  Respiratory: Clear to auscultation bilaterally. GI:    Firm with mild tenderness to palpation at the right lower quadrant, nontender, non-distended  MS: No  edema; No deformity. Neuro:  Nonfocal  Psych: Normal affect   Labs    Chemistry Recent Labs  Lab 10/28/18 0550 10/29/18 0109  10/30/18 0312  NA 140 139 140  K 3.9 4.0 3.8  CL 109 112* 112*  CO2 25 20* 22  GLUCOSE 121* 102* 113*  BUN 25* 19 18  CREATININE 0.96 0.87 0.83  CALCIUM 9.5 8.5* 8.5*  PROT 6.4* 6.0* 5.6*  ALBUMIN 3.3* 3.2* 2.9*  AST 46* 60* 75*  ALT 57* 66* 93*  ALKPHOS 135* 123 135*  BILITOT 0.4 0.3 0.4  GFRNONAA >60 >60 >60  GFRAA >60 >60 >60  ANIONGAP 6 7 6      Hematology Recent Labs  Lab 10/29/18 0109 10/30/18 0312 10/31/18 0341  WBC 7.7 6.7 7.4  RBC 3.31* 3.14* 3.00*  HGB 10.7* 10.2* 9.9*  HCT 34.9*  32.0* 32.8* 31.3*  MCV 105.4* 104.5* 104.3*  MCH 32.3 32.5 33.0  MCHC 30.7 31.1 31.6  RDW 13.8 13.9 13.9  PLT 224 202 191    Cardiac Enzymes Recent Labs  Lab 10/28/18 1318 10/28/18 1918 10/29/18 0109 10/29/18 1129  TROPONINI 0.07* 0.05* 0.05* 0.03*    Recent Labs  Lab 10/28/18 0554 10/28/18 0723 10/28/18 0920  TROPIPOC 0.01 0.03 0.05     BNPNo results for input(s): BNP, PROBNP in the last  168 hours.   DDimer  Recent Labs  Lab 10/28/18 0550  DDIMER 1.24*     Radiology    No results found.  Cardiac Studies   NA  Patient Profile     67 y.o. female with unstable angina and presumed severe CAD (unable to perform cardiac cath due to lack of vascular access), aorto-iliac occlusion, hypercholesterolemia   Assessment & Plan    CHEST PAIN:   Medical management.  Palliative care consult ongoing.  Med adjusted.   Ranexa and isosorbide dinitrate started yesterday.   I will increase the nitrate  ABDOMINAL PAIN:  Plan per primary team.     For questions or updates, please contact [facility] Please consult [email or web address] for contact info under Cardiology/STEMI.   Signed, Rollene RotundaJames Beverley Allender, MD  10/31/2018, 11:30 AM

## 2018-10-31 NOTE — Progress Notes (Signed)
   Subjective: No acute events overnight.  This morning she states "I feel like I have been run over by a truck." She had a good discussion with palliative care yesterday and will continue that discussion today.  She remains full code at this time.  We told her that we did not think she would do well with CPR.  She understands this but has a hard time letting go of that chance.  Morphine has helped tremendously but she seems reluctant to ask for it.  I encouraged her to ask for morphine as often as she needs it.  Objective:  Vital signs in last 24 hours: Vitals:   10/30/18 1409 10/30/18 1640 10/31/18 0519 10/31/18 0856  BP: (!) 147/70 (!) 100/56 136/63 136/84  Pulse:  68 65 74  Resp: (!) 31 19 13    Temp:  (!) 97.5 F (36.4 C) 97.6 F (36.4 C)   TempSrc:  Oral Oral   SpO2:      Weight:       General: Laying in bed, no acute distress Pulmonary: Scattered expiratory wheezes, no crackles, slightly increased work of breathing Cardiac: regular rate and rhythm, no M/R/G.  Assessment/Plan:  Principal Problem:   Non-ST elevation MI (NSTEMI) (HCC) Active Problems:   Goals of care, counseling/discussion   Peripheral vascular occlusive disease (HCC)   Acute on chronic combined systolic and diastolic heart failure (HCC)  67 year old female with CAD, PAD, multiple strokes, blindness, hyperlipidemia who presented with acute onset chest pain, elevated troponins and T wave inversions in the inferior lateral leads consistent with a NSTEMI.   1. NSTEMI/angina: Appreciate cardiology, palliative, and spiritual care's assistance.  Blood pressure has improved we were able to start back Coreg, Ranexa, Imdur. EKG relatively unchanged today. Continues to endorse chest pain or shortness of breath that is relieved with morphine.  Continue IV morphine as needed.  If she needs it more frequently, we can adjust the order. Continue IV heparin, hemoglobin and platelets slowly decreasing. Will continue to monitor.  This may limit how much longer we can continue IV heparin.   2.  Diet: Patient states that caffeine and chocolate are listed as dietary allergies but this is not true.  Patient states that she is not allergic to caffeine or chocolate. I have called dietary and they will remove these allergens from her chart.   Dispo: Prognosis is poor.  Might be a candidate for inpatient hospice if patient is amenable.  However, she remains full code at this time.  [name], Mary Nguyen S, MD 10/31/2018, 10:46 AM Pager: [phone]

## 2018-10-31 NOTE — Progress Notes (Addendum)
Daily Progress Note   Patient Name: Mary Nguyen       Date: 10/31/2018 DOB: [birth year]  Age: 67 y.o. MRN#: [id number] Attending Physician: Annia Belt, MD Primary Care Physician: Duffy Bruce, Manya Silvas, MD Admit Date: 10/28/2018  Reason for Consultation/Follow-up: Establishing goals of care  Subjective: Met with patient. She tells me her main goals at this point are to continue to live until she can see her son who is in [place]. He is homeless, does not have a reliable phone to reach him, her friend Thayer Headings is trying to get money to get him back to [place]. We discussed code status and her desire not to suffer at EOL. She states, "I just don't know what to do, I need to talk to [facility]". We discussed her goal of not suffering at EOL and the concern that CPR will lead to more suffering for her, and she acknowledged this possibility.  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 She notes that if she had CPR and was on a ventilator she would only want to be on a ventilator for a "day or two", would not want prolonged time if it was determined she had an irreversible process. She additionally would not want artificial feeding or hydration. To that end Advanced Directives were completed. Additionally, HCPOA was completed- selecting her son- (for which there is no contact information) and her friend [place] as Economist.  ROS  Length of Stay: 2  Current Medications: Scheduled Meds:  . aspirin EC  81 mg Oral Daily  . atorvastatin  80 mg Oral q1800  . busPIRone  10 mg Oral TID  . carvedilol  3.125 mg Oral BID WC  . clopidogrel  75 mg Oral Daily  . gabapentin  300 mg Oral TID  . isosorbide dinitrate  10  mg Oral TID  . mirtazapine  7.5 mg Oral QHS  . oxybutynin  5 mg Oral BID  . pantoprazole  20 mg Oral Daily  . ranolazine  500 mg Oral BID    Continuous Infusions: . heparin 900 Units/hr (10/31/18 0031)    PRN Meds: acetaminophen **OR** acetaminophen, morphine injection, senna-docusate  Physical Exam  Constitutional: She is oriented to person, place, and time.  frail  HENT:  blind  Cardiovascular: Normal rate and regular rhythm.  Pulmonary/Chest: Effort normal.  Neurological: She is alert and oriented to person, place, and time.  Skin: Skin is warm and dry. There is pallor.  Psychiatric: She has a normal mood and affect. Her behavior is normal.  Nursing note and vitals reviewed.           Vital Signs: BP 136/84   Pulse 74   Temp 97.6 F (36.4 C) (Oral)   Resp 13   Wt 46.9 kg   SpO2 96%   BMI 18.91 kg/m  SpO2: SpO2: 96 % O2 Device: O2 Device: Room Air O2 Flow Rate:    Intake/output summary:   Intake/Output Summary (Last 24 hours) at 10/31/2018 1520 Last data filed at 10/30/2018 1700 Gross per 24 hour  Intake 600 ml  Output 500 ml  Net 100 ml   LBM: Last BM Date: 10/29/18 Baseline Weight: Weight: 45.7 kg Most recent weight: Weight: 46.9 kg       Palliative Assessment/Data: PPS: 30%    Flowsheet Rows     Most Recent Value  Intake Tab  Referral Department  Hospitalist  Unit at Time of Referral  Cardiac/Telemetry Unit  Palliative Care Primary Diagnosis  Cardiac  Date Notified  10/29/18  Palliative Care Type  Return patient Palliative Care  Reason for referral  Clarify Goals of Care  Date of Admission  10/28/18  Date first seen by Palliative Care  10/30/18  # of days Palliative referral response time  1 Day(s)  # of days IP prior to Palliative referral  1  Clinical Assessment  Palliative Performance Scale Score  40%  Psychosocial & Spiritual Assessment  Palliative Care Outcomes  Patient/Family meeting held?  Yes  Who was at the meeting?  patient  and caregiver  Palliative Care Outcomes  Clarified goals of care, Provided psychosocial or spiritual support      Patient Active Problem List   Diagnosis Date Noted  . Acute on chronic combined systolic and diastolic heart failure ([place]) 10/29/2018  . Chest pain 10/28/2018  . Non-ST elevation MI (NSTEMI) ([place])   . Peripheral vascular occlusive disease ([place])   . PVD (peripheral vascular disease) ([place])   . Failure to thrive in adult   . Palliative care by specialist   . Goals of care, counseling/discussion   . Depression   . CAP (community acquired pneumonia) 08/31/2018  . Hypokalemia   . Multifocal pneumonia   . High risk social situation   . Blind   . Tobacco abuse   . Osteoporosis with current pathological fracture   . Protein-calorie malnutrition ([place])   . Angina at rest [facility]) 04/04/2018  . Precordial pain   . History of stroke 04/02/2018  . Hyperlipidemia 04/02/2018  . Unstable angina ([place]) 04/01/2018    Palliative Care Assessment & Plan   Patient Profile: 67 y.o. female  with past medical history of unstable angina, CVAx 4,HLD, blindness, PAD(no suitable artery for heart cath), CAD, bradycardia,T7 compression fracture after a fall in [place] current smoker admitted on 10/28/2018 with acute onset of chest pain at rest. She was in her usual state of health until approximately 4 to 5 days ago when, during physical therapy, she developed chest pain. She has had hypotension with nitroglycerin. Her chest pain continues - reports improvement after morphine. Per chart review, it appears primary team has had multiple goals of care conversations and patient wants to remain full code. PMT consulted for [place].  Assessment/Recommendations/Plan  Continue full scope care- I don't think patient will make a change in her [place] until her son is able to be here  Palliative chaplain is also following patient- will get notary to room to witness Advanced Directives  Goals of Care and  Additional Recommendations:  Limitations on Scope of Treatment: Full Scope Treatment  Code Status:  Full code  Prognosis:   Unable to determine  Discharge Planning:  To Be Determined  Care plan was discussed with patient.  Thank you for allowing the Palliative Medicine Team to assist in the care of this patient.   Time In: 1445 Time Out: 1530 Total Time 45 mins Prolonged Time Billed yes      Greater than 50%  of this time was spent counseling and coordinating care related to the above assessment and plan.  Mariana Kaufman, AGNP-C Palliative Medicine   Please contact Palliative Medicine Team phone at [phone] for questions and concerns.

## 2018-10-31 NOTE — Progress Notes (Signed)
   10/31/18 1205  Clinical Encounter Type  Visited With Patient;Health care provider  Visit Type Follow-up  Referral From Palliative care team  Consult/Referral To Chaplain  The chaplain responded to PMT request for Pt. spiritual care.  Upon entering the room and sharing my name the Pt. Immediately recognized my voice and said, "they said you were going to come."  The chaplain affirmed the time together and recognized the Pt. was receiving assistance with her lunch meal.  The Pt. accepted the invitation for spiritual care follow up on Tuesday morning.

## 2018-10-31 NOTE — Clinical Social Work Note (Signed)
Clinical Social Work Assessment  Patient Details  Name: Mary Nguyen MRN: [id number] Date of Birth: [birth year]  Date of referral:  10/31/18               Reason for consult:  Facility Placement, Discharge Planning                Permission sought to share information with:  Facility Sport and exercise psychologist, Family Supports Permission granted to share information::  Yes, Verbal Permission Granted  Name::     Mary Nguyen   Agency::  [facility]  Relationship::  son  Contact Information:  [phone]  Housing/Transportation Living arrangements for the past 2 months:  [place], Hotel/Motel Source of Information:  Patient Patient Interpreter Needed:  None Criminal Activity/Legal Involvement Pertinent to Current Situation/Hospitalization:  No - Comment as needed Significant Relationships:  Adult Children, Other(Comment)(caregiver) Lives with:  Self, Facility Resident Do you feel safe going back to the place where you live?  Yes Need for family participation in patient care:  No (Coment)  Care giving concerns: Patient came from [facility], where she was for rehab since 09/07/18. Prior to SNF, patient was living in a hotel with her caregiver.   Social Worker assessment / plan: CSW met with patient at bedside. Patient alert and oriented. CSW introduced self and role and discussed disposition planning.   Patient reported she had been participating in therapy at the SNF, but had a time recently due to hurting her knee. Patient stated she was not sure if she wants to return to SNF or not. Patient talked about palliative meeting this weekend and how she felt people are pushing her to be DNR. Patient did reflect on the severity of her medical conditions. Patient hopeful to continue conversation with palliative medicine about goals of care and code status. Patient does want to discuss code status with her son first.   CSW awaiting PT evaluation. Patient has been at the SNF  since 09/07/18 and has used all her Medicare 100% covered days and is in co-pays. CSW to follow and support with disposition planning.  Employment status:  Retired Forensic scientist:  Medicare PT Recommendations:  Not assessed at this time [place] / Referral to community resources:  [place]  Patient/Family's Response to care: Patient appreciative of care.  Patient/Family's Understanding of and Emotional Response to Diagnosis, Current Treatment, and Prognosis: Patient with good understanding of her conditions and wants a PT evaluation. Patient not sure of her prognosis and therefore her disposition at this time.  Emotional Assessment Appearance:  Appears stated age Attitude/Demeanor/Rapport:  Engaged Affect (typically observed):  Accepting, Calm, Blunt Orientation:  Oriented to Self, Oriented to Place, Oriented to  Time, Oriented to Situation Alcohol / Substance use:  Not Applicable Psych involvement (Current and /or in the community):  No (Comment)  Discharge Needs  Concerns to be addressed:  Discharge Planning Concerns, Care Coordination Readmission within the last 30 days:  No Current discharge risk:  Physical Impairment Barriers to Discharge:  Continued Medical Work up   Estanislado Emms, LCSW 10/31/2018, 11:52 AM

## 2018-10-31 NOTE — Progress Notes (Signed)
Medicine attending: I examined this patient today and I concur with the evaluation and management plan as recorded by resident physician Dr. Maryelizabeth KaufmannMarie Vogel.  We appreciate cardiology and palliative care consultations.  67 year old woman with advanced coronary and peripheral vascular disease admitted on November 22 with recurrent ischemic quality chest pain with evidence for a non-ST elevation MI.  She is on maximum medical therapy.  She suffers from multiple other complications of her vascular disease status post multiple strokes, and blindness. She had an exaggerated hypertensive response to initial sublingual nitroglycerin.  She was started on a parenteral heparin infusion. Due to poor vascular access with aorto iliac occlusion and extensive calcification and atherosclerosis of the femoral artery.  She is not a candidate for a cardiac catheterization.  Please see report of attempted catheterization dated April 04, 2018 for details. We are slowly titrating her Ranexa and nitrates. We discussed goals of care issues with the patient again this morning.  Although she agrees quality of life is more important than quantity, at present she still wants full support including CPR in the event of an acute deterioration in her condition.  We will continue to provide counseling and support.

## 2018-11-01 ENCOUNTER — Ambulatory Visit: Payer: Medicare Other | Admitting: Podiatry

## 2018-11-01 DIAGNOSIS — Z87891 Personal history of nicotine dependence: Secondary | ICD-10-CM

## 2018-11-01 DIAGNOSIS — R062 Wheezing: Secondary | ICD-10-CM

## 2018-11-01 DIAGNOSIS — R1011 Right upper quadrant pain: Secondary | ICD-10-CM

## 2018-11-01 DIAGNOSIS — R1010 Upper abdominal pain, unspecified: Secondary | ICD-10-CM

## 2018-11-01 DIAGNOSIS — R740 Nonspecific elevation of levels of transaminase and lactic acid dehydrogenase [LDH]: Secondary | ICD-10-CM

## 2018-11-01 DIAGNOSIS — R079 Chest pain, unspecified: Secondary | ICD-10-CM

## 2018-11-01 LAB — CREATININE, SERUM
Creatinine, Ser: 1.05 mg/dL — ABNORMAL HIGH (ref 0.44–1.00)
GFR calc Af Amer: >60 mL/min (ref >60)
GFR calc non Af Amer: 55 mL/min — ABNORMAL LOW (ref >60)

## 2018-11-01 LAB — HEPARIN LEVEL (UNFRACTIONATED): Heparin Unfractionated: 0.3 IU/mL (ref 0.30–0.70)

## 2018-11-01 LAB — CBC
HEMATOCRIT: 34.9 % — AB (ref 36.0–46.0)
Hemoglobin: 10.9 g/dL — ABNORMAL LOW (ref 12.0–15.0)
MCH: 32.8 pg (ref 26.0–34.0)
MCHC: 31.2 g/dL (ref 30.0–36.0)
MCV: 105.1 fL — AB (ref 80.0–100.0)
Platelets: 205 10*3/uL (ref 150–400)
RBC: 3.32 MIL/uL — ABNORMAL LOW (ref 3.87–5.11)
RDW: 13.9 % (ref 11.5–15.5)
WBC: 7.1 10*3/uL (ref 4.0–10.5)
nRBC: 0 % (ref 0.0–0.2)

## 2018-11-01 MED ORDER — GUAIFENESIN 100 MG/5ML PO SOLN: 5.0000 mL | ORAL | Status: DC | PRN

## 2018-11-01 MED ORDER — ONDANSETRON HCL 4 MG/2ML IJ SOLN
4.0000 mg | Freq: Four times a day (QID) | INTRAMUSCULAR | Status: DC | PRN
  Administered 2018-11-01 (×2): 4 mg via INTRAVENOUS
  Filled 2018-11-01 (×2): qty 2

## 2018-11-01 MED ORDER — HEPARIN SODIUM (PORCINE) 5000 UNIT/ML IJ SOLN
5000.0000 [IU] | Freq: Three times a day (TID) | INTRAMUSCULAR | Status: DC
  Administered 2018-11-01 – 2018-11-02 (×4): 5000 [IU] via SUBCUTANEOUS
  Filled 2018-11-01 (×6): qty 1

## 2018-11-01 MED ORDER — MORPHINE SULFATE (PF) 2 MG/ML IV SOLN
2.0000 mg | INTRAVENOUS | Status: DC | PRN
  Administered 2018-11-01 – 2018-11-02 (×4): 2 mg via INTRAVENOUS
  Filled 2018-11-01 (×3): qty 1

## 2018-11-01 MED ORDER — SENNOSIDES-DOCUSATE SODIUM 8.6-50 MG PO TABS
2.0000 | ORAL_TABLET | Freq: Two times a day (BID) | ORAL | Status: DC
  Administered 2018-11-01 – 2018-11-02 (×3): 2 via ORAL
  Filled 2018-11-01 (×3): qty 2

## 2018-11-01 MED ORDER — MORPHINE SULFATE (PF) 2 MG/ML IV SOLN: 2.0000 mg | INTRAVENOUS | Status: DC | PRN

## 2018-11-01 MED ORDER — MORPHINE SULFATE (PF) 2 MG/ML IV SOLN
2.0000 mg | INTRAVENOUS | Status: DC | PRN
  Filled 2018-11-01: qty 1

## 2018-11-01 MED ORDER — ALBUTEROL SULFATE (2.5 MG/3ML) 0.083% IN NEBU: 2.5000 mg | INHALATION_SOLUTION | RESPIRATORY_TRACT | Status: DC | PRN

## 2018-11-01 NOTE — Evaluation (Addendum)
Physical Therapy Evaluation Patient Details Name: Mary Nguyen MRN: [id number] DOB: May 07, 1951 Today's Date: 11/01/2018   History of Present Illness  Mary Nguyen is a 67yo female who comes to [facility] after multiple episodes of CP while resting, pt found to have acute NSTEMI. PMH: CAD, PVD, CVA c subsequent visual impairment, Left weakness, and RUE contracture. Pt has been living at [facility] for the past 6 weeks, working with PT not yet able to AMB meaningful distances, recently stopped working with OT and reports some decreased BUE function. Baseline needs for ADL.   Clinical Impression  Pt admitted with above diagnosis. Pt currently with functional limitations due to the deficits listed below (see "PT Problem List"). Upon entry, pt in bed, no family/caregiver present. PT returning 1 hour after episode of emesis, as requested by RN. The pt is awake and agreeable to participate. The pt is alert and oriented x3, a bit sparse of detail regarding neurological deficits, but largely pleasant, conversational, and following simple commands consistently. Noted trophic changes, weakness, and hypomobility of the LLE and strong contracture of the RUE from fingers to shoulder. Mod-MaxA required for bed mobility and transfers, pt reports similar to PTA where she was working with PPT. She is able to sit at EOB with adequate static balance for short periods, but is unable to establish standing balance. Functional mobility assessment demonstrates increased effort/time requirements, poor tolerance, and need for physical assistance, whereas the patient performed these at a higher level of independence PTA. Pt has concerns that her BUE function feels generally in decline since stopping OT services PTA. Pt is largely reluctant to remain up in chair d/t purewick use which is not well understood by this author. She is encouraged to sit up for a short time period to which she is eventually agreeable, but in response to  education intervention regarding health benefits from avoiding bedrest, she responds with fatalistic counterpoint suggestive that she may be having a difficult time coping with her current medical complications and limited treatment options.  Pt will benefit from skilled PT intervention to increase independence and safety with basic mobility in preparation for discharge to the venue listed below.       Follow Up Recommendations SNF;Supervision for mobility/OOB;Supervision - Intermittent    Equipment Recommendations  None recommended by PT    Recommendations for Other Services       Precautions / Restrictions Precautions Precautions: None Restrictions Weight Bearing Restrictions: No      Mobility  Bed Mobility Overal bed mobility: Needs Assistance Bed Mobility: Supine to Sit     Supine to sit: Max assist     General bed mobility comments: truncal weakness, and limited capacity with BUE use  Transfers Overall transfer level: Needs assistance   Transfers: Stand Pivot Transfers   Stand pivot transfers: Mod assist       General transfer comment: noted contractures in knees and ankles, and continued retropulsion, requites PT to keep from falling backward; follows cues for movement   Ambulation/Gait Ambulation/Gait assistance: (not appropriate at this time)              Stairs            Wheelchair Mobility    Modified Rankin (Stroke Patients Only)       Balance Overall balance assessment: Needs assistance Sitting-balance support: Feet supported;Feet unsupported;No upper extremity supported Sitting balance-Leahy Scale: Good Sitting balance - Comments: baseline Right lean    Standing balance support: During functional activity Standing balance-Leahy Scale:  Zero                               Pertinent Vitals/Pain Pain Assessment: No/denies pain    Home Living Family/patient expects to be discharged to:: Skilled nursing facility                       Prior Function Level of Independence: Needs assistance               Hand Dominance   Dominant Hand: Left(now uses Left d/t RUE contracture s/p CVA)    Extremity/Trunk Assessment   Upper Extremity Assessment Upper Extremity Assessment: RUE deficits/detail RUE Deficits / Details: contracture and weakness from hand to shoulder     Lower Extremity Assessment Lower Extremity Assessment: Generalized weakness;RLE deficits/detail;LLE deficits/detail RLE Deficits / Details: mild weakness  LLE Deficits / Details: noted trophic changes, weakness, and limited AROM in ankle/toes        Communication      Cognition Arousal/Alertness: Awake/alert Behavior During Therapy: WFL for tasks assessed/performed Overall Cognitive Status: Within Functional Limits for tasks assessed                                        General Comments      Exercises     Assessment/Plan    PT Assessment Patient needs continued PT services  PT Problem List Decreased strength;Decreased range of motion;Decreased activity tolerance;Decreased balance;Decreased mobility       PT Treatment Interventions Functional mobility training;Therapeutic activities;Therapeutic exercise;Patient/family education    PT Goals (Current goals can be found in the Care Plan section)  Acute Rehab PT Goals Patient Stated Goal: improve BUE function PT Goal Formulation: With patient Time For Goal Achievement: 11/15/18 Potential to Achieve Goals: Fair    Frequency Min 3X/week   Barriers to discharge        Co-evaluation               AM-PAC PT "6 Clicks" Mobility  Outcome Measure Help needed turning from your back to your side while in a flat bed without using bedrails?: Total Help needed moving from lying on your back to sitting on the side of a flat bed without using bedrails?: Total Help needed moving to and from a bed to a chair (including a wheelchair)?: Total Help  needed standing up from a chair using your arms (e.g., wheelchair or bedside chair)?: Total Help needed to walk in hospital room?: Total Help needed climbing 3-5 steps with a railing? 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

## 2018-11-01 NOTE — Progress Notes (Signed)
   Subjective: [name]. Continues to endorse abdominal pain and mild chest pain. Audible wheezing. Eating breakfast, reports good appetite. I later received an update from the nurse that Ms. Wessling had one episode of vomiting after eating breakfast.   Objective:  Vital signs in last 24 hours: Vitals:   10/31/18 1748 10/31/18 1800 10/31/18 2025 11/01/18 0611  BP: (!) 103/59  101/74 97/68  Pulse: 63  62 67  Resp:      Temp:   98 F (36.7 C) 98.4 F (36.9 C)  TempSrc:    Oral  SpO2:   100% 97%  Weight:    45.5 kg  Height:  5\' 2"  (1.575 m)     Gen: laying comfortably in bed, aide at bedside feeding breakfast  Cardiac: RRR, no m/r/g Pulm: diffuse expiratory wheezing, normal work of breathing, no crackles, good air movement throughout Abd: LUQ tenderness. Soft, non-distended. Active bowel sounds   Assessment/Plan:  Principal Problem:   Non-ST elevation MI (NSTEMI) (HCC) Active Problems:   Goals of care, counseling/discussion   Peripheral vascular occlusive disease (HCC)   Acute on chronic combined systolic and diastolic heart failure (HCC)   Advanced care planning/counseling discussion  1. Chest pain: Stable. Continue medical management. Nitrates increased per cardiology, discontinue IV heparin, start DVT ppx. Continue IV morphine prn. Ongoing goals of care conversations.   2. RUQ abdominal pain: Present since admission. RUQ ultrasound on admission was normal. Pain likely 2/2 ischemia given increased transaminases. Abdomen was soft, non distended, active bowel sounds. Good po intake but no BM in several days.  - Will increase bowel regiment.  - Pain control with tylenol and IV morphine. - If exam changes, can consider KUB, but I do not think that is indicated at this time.    3. Wheezing: also notes increased cough, nonproductive. Significant smoking history and evidence of bronchitis on CT chest.  - albuterol inhaler - guaifenesin   Dispo: Anticipated discharge in  approximately 2 day(s).   [name],  S, MD 11/01/2018, 9:33 AM Pager: [phone]

## 2018-11-01 NOTE — Progress Notes (Addendum)
CSW met with patient at bedside to discuss disposition, as patient had been unsure yesterday of what she wanted to do at discharge. Today patient stated she still was not sure if she wants to return to [place] or go home (to the hotel where she was staying with her caregiver, Delana Meyer).   Patient agreeable for CSW to send updates to [place]. Patient still unable to reach her son and wants to discuss code status and goals of care with him. Son's number listed in the chart is not a working number.   Patient stated she had chest pain that had just started a few minutes ago. CSW alerted RN.  CSW to follow and support.  Estanislado Emms, [place]

## 2018-11-01 NOTE — Progress Notes (Signed)
   11/01/18 1251  Clinical Encounter Type  Visited With Patient;Health care provider  Visit Type Follow-up  Referral From Nurse  Consult/Referral To Chaplain;Other (Comment) (AD)  The chaplain  responded to Pt. spiritual care request for AD notary.  The chaplain verified AD document was complete.  The chaplain is noting the Pt. understands the document and the role of her HCPOA.  The chaplain will contact notary for completion.  Before exiting the room the Pt. and chaplain prayed together.  The chaplain is available for spiritual care follow up as requested by staff and Pt.

## 2018-11-01 NOTE — Progress Notes (Signed)
Medicine attending: I examined this patient today and I concur with the evaluation and management plan as recorded by resident physician Dr Maryelizabeth KaufmannMarie Vogel. No acute change in patient's condition.  She continues to have intermittent chest pain.  She is receiving small doses of morphine.  Slowly titrating nitrates given her sensitivity to the hypotensive effects. No clear endpoint to hospitalization in view of fixed vascular deficits not amenable to invasive procedures such as angioplasty.  We are attempting to maximize her medical therapy.

## 2018-11-01 NOTE — Progress Notes (Signed)
Progress Note  Patient Name: Mary Nguyen Date of Encounter: 11/01/2018  Primary Cardiologist:   Chrystie NoseKenneth C Hilty, MD   Subjective   Vomited this morning.  Continues to complain of abdominal pain. Still with some chest pain.  No distress  Inpatient Medications    Scheduled Meds: . aspirin EC  81 mg Oral Daily  . atorvastatin  80 mg Oral q1800  . busPIRone  10 mg Oral TID  . carvedilol  3.125 mg Oral BID WC  . clopidogrel  75 mg Oral Daily  . gabapentin  300 mg Oral TID  . isosorbide dinitrate  10 mg Oral TID  . mirtazapine  7.5 mg Oral QHS  . oxybutynin  5 mg Oral BID  . pantoprazole  20 mg Oral Daily  . ranolazine  500 mg Oral BID   Continuous Infusions: . heparin 900 Units/hr (11/01/18 0323)   PRN Meds: acetaminophen **OR** acetaminophen, morphine injection, senna-docusate   Vital Signs    Vitals:   10/31/18 1748 10/31/18 1800 10/31/18 2025 11/01/18 0611  BP: (!) 103/59  101/74 97/68  Pulse: 63  62 67  Resp:      Temp:   98 F (36.7 C) 98.4 F (36.9 C)  TempSrc:    Oral  SpO2:   100% 97%  Weight:    45.5 kg  Height:  5\' 2"  (1.575 m)      Intake/Output Summary (Last 24 hours) at 11/01/2018 0849 Last data filed at 11/01/2018 0636 Gross per 24 hour  Intake 1374 ml  Output 2550 ml  Net -1176 ml   Filed Weights   10/29/18 0413 10/30/18 0552 11/01/18 0611  Weight: 46.5 kg 46.9 kg 45.5 kg    Telemetry    NSR - Personally Reviewed  ECG    NA - Personally Reviewed  Physical Exam   GEN: No acute distress, very frail.  She looks much much older than her stated age. Neck: No  JVD Cardiac: RRR, no murmurs, rubs, or gallops.  Respiratory:   Expiratory wheezing GI:   Firm and slightly distended with tenderness to palpation right lower quadrant but not rebound or guarding MS:  No edema; No deformity. Neuro:   Nonfocal  Psych: Oriented and appropriate    Labs    Chemistry Recent Labs  Lab 10/28/18 0550 10/29/18 0109 10/30/18 0312  NA 140  139 140  K 3.9 4.0 3.8  CL 109 112* 112*  CO2 25 20* 22  GLUCOSE 121* 102* 113*  BUN 25* 19 18  CREATININE 0.96 0.87 0.83  CALCIUM 9.5 8.5* 8.5*  PROT 6.4* 6.0* 5.6*  ALBUMIN 3.3* 3.2* 2.9*  AST 46* 60* 75*  ALT 57* 66* 93*  ALKPHOS 135* 123 135*  BILITOT 0.4 0.3 0.4  GFRNONAA >60 >60 >60  GFRAA >60 >60 >60  ANIONGAP 6 7 6      Hematology Recent Labs  Lab 10/30/18 0312 10/31/18 0341 11/01/18 0450  WBC 6.7 7.4 7.1  RBC 3.14* 3.00* 3.32*  HGB 10.2* 9.9* 10.9*  HCT 32.8* 31.3* 34.9*  MCV 104.5* 104.3* 105.1*  MCH 32.5 33.0 32.8  MCHC 31.1 31.6 31.2  RDW 13.9 13.9 13.9  PLT 202 191 205    Cardiac Enzymes Recent Labs  Lab 10/28/18 1318 10/28/18 1918 10/29/18 0109 10/29/18 1129  TROPONINI 0.07* 0.05* 0.05* 0.03*    Recent Labs  Lab 10/28/18 0554 10/28/18 0723 10/28/18 0920  TROPIPOC 0.01 0.03 0.05     BNPNo results for input(s): BNP, PROBNP in  the last 168 hours.   DDimer  Recent Labs  Lab 10/28/18 0550  DDIMER 1.24*     Radiology    No results found.  Cardiac Studies   NA  Patient Profile     67 y.o. female with unstable angina and presumed severe CAD (unable to perform cardiac cath due to lack of vascular access), aorto-iliac occlusion, hypercholesterolemia   Assessment & Plan    CHEST PAIN:   Medical management.  Palliative care consult ongoing. Pt is still full code after the discussion yesterday.  Nitrates increased yesterday.  I think that we have reached the limit of her IV heparin and will change to DVT prophylaxis.    ABDOMINAL PAIN:  Per the primary team.  No BM x a few days.  Question the need for abdominal imaging.    For questions or updates, please contact [facility] Please consult [email or web address] for contact info under Cardiology/STEMI.   Signed, Rollene Rotunda, MD  11/01/2018, 8:49 AM

## 2018-11-01 NOTE — NC FL2 (Signed)
[place] MEDICAID FL2 LEVEL OF CARE SCREENING TOOL     IDENTIFICATION  Patient Name: Mary Nguyen Birthdate: 22-Sep-1951 Sex: female Admission Date (Current Location): 10/28/2018  [facility] and [place] Number:  Producer, television/film/video and Address:  [facility] [place], [address], [place], [place] [address]      Provider Number: [id number]  Attending Physician Name and Address:  Levert Feinstein, MD  Relative Name and Phone Number:  Farrell Ours, son    Current Level of Care: Hospital Recommended Level of Care: Skilled Nursing Facility Prior Approval Number:    Date Approved/Denied:   PASRR Number: [id number]  Discharge Plan: SNF    Current Diagnoses: Patient Active Problem List   Diagnosis Date Noted  . Upper abdominal pain   . Advanced care planning/counseling discussion   . Acute on chronic combined systolic and diastolic heart failure (HCC) 10/29/2018  . Chest pain 10/28/2018  . Non-ST elevation MI (NSTEMI) (HCC)   . Peripheral vascular occlusive disease (HCC)   . PVD (peripheral vascular disease) (HCC)   . Failure to thrive in adult   . Palliative care by specialist   . Goals of care, counseling/discussion   . Depression   . CAP (community acquired pneumonia) 08/31/2018  . Hypokalemia   . Multifocal pneumonia   . High risk social situation   . Blind   . Tobacco abuse   . Osteoporosis with current pathological fracture   . Protein-calorie malnutrition (HCC)   . Angina at rest [facility]) 04/04/2018  . Precordial pain   . History of stroke 04/02/2018  . Hyperlipidemia 04/02/2018  . Unstable angina (HCC) 04/01/2018    Orientation RESPIRATION BLADDER Height & Weight     Self, Place, Situation, Time  Normal External catheter, Incontinent Weight: 45.5 kg Height:  5\' 2"  (157.5 cm)  BEHAVIORAL SYMPTOMS/MOOD NEUROLOGICAL BOWEL NUTRITION STATUS      Continent Diet(please see DC summary)  AMBULATORY STATUS COMMUNICATION OF NEEDS Skin    Extensive Assist Verbally Normal                       Personal Care Assistance Level of Assistance  Bathing, Feeding, Dressing Bathing Assistance: Limited assistance Feeding assistance: Independent Dressing Assistance: Limited assistance     Functional Limitations Info  Sight, Hearing, Speech Sight Info: Impaired Hearing Info: Adequate Speech Info: Adequate    SPECIAL CARE FACTORS FREQUENCY  PT (By licensed PT), OT (By licensed OT)     PT Frequency: 5x/week OT Frequency: 5x/week            Contractures Contractures Info: Not present    Additional Factors Info  Code Status, Allergies Code Status Info: Full Allergies Info: Penicillins           Current Medications (11/01/2018):  This is the current hospital active medication list Current Facility-Administered Medications  Medication Dose Route Frequency Provider Last Rate Last Dose  . acetaminophen (TYLENOL) tablet 650 mg  650 mg Oral Q6H PRN Geralyn Corwin Ratliff, DO   650 mg at 10/31/18 1543   Or  . acetaminophen (TYLENOL) suppository 650 mg  650 mg Rectal Q6H PRN Geralyn Corwin Ratliff, DO      . albuterol (PROVENTIL) (2.5 MG/3ML) 0.083% nebulizer solution 2.5 mg  2.5 mg Nebulization Q4H PRN Ali Lowe, MD      . aspirin EC tablet 81 mg  81 mg Oral Daily Geralyn Corwin Ratliff, DO   81 mg at 11/01/18 1117  .  atorvastatin (LIPITOR) tablet 80 mg  80 mg Oral q1800 Geralyn CorwinHoffman, Jessica Ratliff, DO   80 mg at 10/31/18 1748  . busPIRone (BUSPAR) tablet 10 mg  10 mg Oral TID Geralyn CorwinHoffman, Jessica Ratliff, DO   10 mg at 11/01/18 1113  . carvedilol (COREG) tablet 3.125 mg  3.125 mg Oral BID WC Croitoru, Mihai, MD   3.125 mg at 10/31/18 1748  . clopidogrel (PLAVIX) tablet 75 mg  75 mg Oral Daily Bhagat, Bhavinkumar, PA   75 mg at 11/01/18 1116  . gabapentin (NEURONTIN) capsule 300 mg  300 mg Oral TID Geralyn CorwinHoffman, Jessica Ratliff, DO   300 mg at 11/01/18 1115  . guaiFENesin (ROBITUSSIN) 100 MG/5ML solution 100 mg  5 mL  Oral Q4H PRN [name], Marie S, MD      . heparin injection 5,000 Units  5,000 Units Subcutaneous Tedra CoupeQ8H Rollene RotundaHochrein, James, MD   5,000 Units at 11/01/18 1149  . isosorbide dinitrate (ISORDIL) tablet 10 mg  10 mg Oral TID Rollene RotundaHochrein, James, MD   10 mg at 11/01/18 1116  . mirtazapine (REMERON) tablet 7.5 mg  7.5 mg Oral QHS Geralyn CorwinHoffman, Jessica Ratliff, DO   7.5 mg at 10/31/18 2202  . morphine 2 MG/ML injection 2 mg  2 mg Intravenous Q4H PRN [name], Marie S, MD   2 mg at 11/01/18 0916  . ondansetron (ZOFRAN) injection 4 mg  4 mg Intravenous Q6H PRN Rollene RotundaHochrein, James, MD   4 mg at 11/01/18 0913  . oxybutynin (DITROPAN) tablet 5 mg  5 mg Oral BID Geralyn CorwinHoffman, Jessica Ratliff, DO   5 mg at 11/01/18 1115  . pantoprazole (PROTONIX) EC tablet 20 mg  20 mg Oral Daily Geralyn CorwinHoffman, Jessica Ratliff, DO   20 mg at 11/01/18 1116  . ranolazine (RANEXA) 12 hr tablet 500 mg  500 mg Oral BID Croitoru, Mihai, MD   500 mg at 11/01/18 1113  . senna-docusate (Senokot-S) tablet 2 tablet  2 tablet Oral BID [name], Marie S, MD   2 tablet at 11/01/18 1118     Discharge Medications: Please see discharge summary for a list of discharge medications.  Relevant Imaging Results:  Relevant Lab Results:   Additional Information SSN: [id number]  Abigail ButtsSusan Doreene Forrey, LCSW

## 2018-11-01 NOTE — Discharge Summary (Signed)
Name: Mary Nguyen MRN: [id number] DOB: [birth year] 67 y.o. PCP: Justin Mend, MD  Date of Admission: 10/28/2018  5:33 AM Date of Discharge: 10/2718 Attending Physician: Levert Feinstein, MD  Discharge Diagnosis: 1. Chest pain, NSTEMI 2. Coronary artery disease 3. Peripheral vascular occlusive disease  4. RUQ abdominal pain  Discharge Medications: Allergies as of 11/02/2018      Reactions   Penicillins Anaphylaxis   Has patient had a PCN reaction causing immediate rash, facial/tongue/throat swelling, SOB or lightheadedness with hypotension: Yes Has patient had a PCN reaction causing severe rash involving mucus membranes or skin necrosis: No Has patient had a PCN reaction that required hospitalization: Yes Has patient had a PCN reaction occurring within the last 10 years: No If all of the above answers are "NO", then may proceed with Cephalosporin use.      Medication List    TAKE these medications   acetaminophen 325 MG tablet Commonly known as:  TYLENOL Take 2 tablets (650 mg total) by mouth every 6 (six) hours.   albuterol (2.5 MG/3ML) 0.083% nebulizer solution Commonly known as:  PROVENTIL Take 3 mLs (2.5 mg total) by nebulization every 4 (four) hours as needed for wheezing or shortness of breath.   aspirin 81 MG EC tablet Take 1 tablet (81 mg total) by mouth daily.   atorvastatin 80 MG tablet Commonly known as:  LIPITOR Take 1 tablet (80 mg total) by mouth daily at 6 PM.   Bath/Shower Seat Misc Shower seat   busPIRone 5 MG tablet Commonly known as:  BUSPAR Take 10 mg by mouth 3 (three) times daily.   calcium-vitamin D 500-200 MG-UNIT tablet Commonly known as:  OSCAL WITH D Take 1 tablet by mouth daily with breakfast.   carvedilol 3.125 MG tablet Commonly known as:  COREG Take 1 tablet (3.125 mg total) by mouth 2 (two) times daily with a meal.   clopidogrel 75 MG tablet Commonly known as:  PLAVIX Take 1 tablet (75 mg total) by mouth  daily. Start taking on:  11/03/2018   gabapentin 300 MG capsule Commonly known as:  NEURONTIN Take 1 capsule (300 mg total) by mouth 3 (three) times daily for 7 days.   guaiFENesin 100 MG/5ML Soln Commonly known as:  ROBITUSSIN Take 5 mLs (100 mg total) by mouth every 4 (four) hours as needed for cough or to loosen phlegm.   isosorbide mononitrate 30 MG 24 hr tablet Commonly known as:  IMDUR Take 1 tablet (30 mg total) by mouth daily.   Melatonin 3 MG Tabs Take 3 mg by mouth at bedtime.   mirtazapine 7.5 MG tablet Commonly known as:  REMERON Take 1 tablet (7.5 mg total) by mouth at bedtime.   nitroGLYCERIN 0.4 MG SL tablet Commonly known as:  NITROSTAT Place 1 tablet (0.4 mg total) under the tongue every 5 (five) minutes as needed for chest pain.   oxybutynin 5 MG tablet Commonly known as:  DITROPAN Take 5 mg by mouth 2 (two) times daily.   oxyCODONE 5 MG immediate release tablet Commonly known as:  Oxy IR/ROXICODONE Take 1 tablet (5 mg total) by mouth every 6 (six) hours as needed (pain).   pantoprazole 20 MG tablet Commonly known as:  PROTONIX Take 1 tablet (20 mg total) by mouth daily.   polyethylene glycol packet Commonly known as:  MIRALAX / GLYCOLAX Take 17 g by mouth daily as needed for mild constipation.   ranolazine 500 MG 12 hr tablet Commonly known as:  RANEXA Take 1 tablet (500 mg total) by mouth 2 (two) times daily.   senna-docusate 8.6-50 MG tablet Commonly known as:  Senokot-S Take 2 tablets by mouth at bedtime as needed for mild constipation.       Disposition and follow-up:   Ms.Brinkley Spangle was discharged from [facility] in Stable condition.  At the hospital follow up visit please address:  1.  Assess for hypotension 2/2 oral nitrates. Discuss code status. We started more medicines for chest pain and gave oxycodone for break through pain.   2.  Labs / imaging needed at time of follow-up: none  3.  Pending labs/ test  needing follow-up: none  Follow-up Appointments:  Contact information for follow-up providers    Azalee Course, [place] Follow up on 11/16/2018.   Specialties:  Cardiology, Radiology Why:  Please arrive 15 minutes early for your 10:00am post-hospital cardiology appointment Contact information: [address] [place] [address] [phone]            Contact information for after-discharge care    Destination    [facility] .   Service:  Skilled Nursing Contact information: [address] [place] [address] [phone]                  Hospital Course by problem list: 67 year old female with CAD, PAD, multiple strokes, blindness, hyperlipidemia who presented with acute onset chest pain, elevated troponins and T wave inversions in the inferior lateral leads consistent with aNSTEMI. Cardiology was consulted and she was not a candidate for PCI due to occluded radial and femoral arteries on past attempt at PCI. She was treated with medical therapy, including IV nitroglycerin, IV heparin. She continued to have intermittent chest pain and repeat EKGs showed signs of global ischemia with diffuse t wave inversions, ST depressions, and prolonged QT. Some of her home meds were held due to hypotension and IV nitroglycerin was discontinued due to hypotension. Palliative and Spiritual Care were consulted. Extensive goals of care discussion, patient remained full code and appointed her son as HCPOA. Her son is homeless in [place] and does not have reliable means of communication or transportation. Her condition stabilized and we were able to restart po nitrates. IV heparin was switched to subcutaneous heparin. And she was discharged to SNF on ranexa, imdur, coreg, plavix aspirin, oxycodone 5mg  q6h prn.   Of note, she also endorsed persistent RUQ abdominal pain. Ultrasound was normal. Labs showed mild increase in AST and ALT, which eventually down trended.  Symptoms improved after bowel movement.   Discharge Vitals:   BP (!) 126/51   Pulse 78   Temp 98.4 F (36.9 C) (Oral)   Resp 20   Ht 5\' 2"  (1.575 m)   Wt 45.8 kg   SpO2 95%   BMI 18.47 kg/m   Pertinent Labs, Studies, and Procedures:  Troponins: 0.07 --> 0.05 --> 0.05 --> 0.03 CMP Latest Ref Rng & Units 11/02/2018 11/01/2018 10/30/2018  Glucose 70 - 99 mg/dL 578(I) - 696(E)  BUN 8 - 23 mg/dL 21 - 18  Creatinine 9.52 - 1.00 mg/dL 8.41(L) 2.44(W) 1.02  Sodium 135 - 145 mmol/L 137 - 140  Potassium 3.5 - 5.1 mmol/L 4.2 - 3.8  Chloride 98 - 111 mmol/L 107 - 112(H)  CO2 22 - 32 mmol/L 23 - 22  Calcium 8.9 - 10.3 mg/dL 7.2(Z) - 8.5(L)  Total Protein 6.5 - 8.1 g/dL 3.6(U) - 5.6(L)  Total Bilirubin 0.3 - 1.2 mg/dL  0.5 - 0.4  Alkaline Phos 38 - 126 U/L 124 - 135(H)  AST 15 - 41 U/L 32 - 75(H)  ALT 0 - 44 U/L 58(H) - 93(H)    CLINICAL DATA:  Upper abdominal pain  EXAM: ULTRASOUND ABDOMEN LIMITED RIGHT UPPER QUADRANT  COMPARISON:  CT abdomen and pelvis August 30, 2018  FINDINGS: Gallbladder:  No gallstones or wall thickening visualized. There is no pericholecystic fluid. No sonographic Murphy sign noted by sonographer.  Common bile duct:  Diameter: 3 mm. No intrahepatic or extrahepatic biliary duct dilatation.  Liver:  No focal lesion identified. Within normal limits in parenchymal echogenicity. Portal vein is patent on color Doppler imaging with normal direction of blood flow towards the liver.  IMPRESSION: Study within normal limits.  Discharge Instructions: Discharge Instructions    Call MD for:  persistant nausea and vomiting   Complete by:  As directed    Call MD for:  severe uncontrolled pain   Complete by:  As directed    Diet - low sodium heart healthy   Complete by:  As directed    Discharge instructions   Complete by:  As directed    Ms. Guilbault,  You were admitted to the hospital for management of chest pain. We treated you with all  the medicines we could and were able to get your chest pain a little better controlled. We have sent you home with some extra chest pain medicines (Ranexa, clopidogrel, carvedilol). We also started you on oxycodone for chronic pain and breakthrough chest pain. This medicine can make you constipated so make sure you are having regular bowel movements. For you wheezing and shortness of breath, I gave you an albuterol inhaler to use as needed and some cough syrup.    If your chest pain worsens or you experience associated jaw pain, arm numbness, or belly pain please come back to the hospital.   Cardiology will set up outpatient follow up if needed.  It was a pleasure taking care of you.  -Dr. Petra KubaVogel   Increase activity slowly   Complete by:  As directed       Signed: [name], Tawana Pasch S, MD 11/02/2018, 1:13 PM   Pager: [phone]

## 2018-11-02 ENCOUNTER — Telehealth: Payer: Self-pay | Admitting: Physician Assistant

## 2018-11-02 DIAGNOSIS — Z7902 Long term (current) use of antithrombotics/antiplatelets: Secondary | ICD-10-CM

## 2018-11-02 DIAGNOSIS — Z7982 Long term (current) use of aspirin: Secondary | ICD-10-CM

## 2018-11-02 DIAGNOSIS — K5900 Constipation, unspecified: Secondary | ICD-10-CM

## 2018-11-02 LAB — CBC
HEMATOCRIT: 32.5 % — AB (ref 36.0–46.0)
HEMOGLOBIN: 10.2 g/dL — AB (ref 12.0–15.0)
MCH: 33 pg (ref 26.0–34.0)
MCHC: 31.4 g/dL (ref 30.0–36.0)
MCV: 105.2 fL — ABNORMAL HIGH (ref 80.0–100.0)
Platelets: 195 10*3/uL (ref 150–400)
RBC: 3.09 MIL/uL — ABNORMAL LOW (ref 3.87–5.11)
RDW: 13.8 % (ref 11.5–15.5)
WBC: 7.2 10*3/uL (ref 4.0–10.5)
nRBC: 0 % (ref 0.0–0.2)

## 2018-11-02 LAB — COMPREHENSIVE METABOLIC PANEL
ALBUMIN: 2.8 g/dL — AB (ref 3.5–5.0)
ALT: 58 U/L — AB (ref 0–44)
AST: 32 U/L (ref 15–41)
Alkaline Phosphatase: 124 U/L (ref 38–126)
Anion gap: 7 (ref 5–15)
BILIRUBIN TOTAL: 0.5 mg/dL (ref 0.3–1.2)
BUN: 21 mg/dL (ref 8–23)
CHLORIDE: 107 mmol/L (ref 98–111)
CO2: 23 mmol/L (ref 22–32)
CREATININE: 1.16 mg/dL — AB (ref 0.44–1.00)
Calcium: 8.6 mg/dL — ABNORMAL LOW (ref 8.9–10.3)
GFR calc Af Amer: 57 mL/min — ABNORMAL LOW (ref >60)
GFR, EST NON AFRICAN AMERICAN: 49 mL/min — AB (ref >60)
Glucose, Bld: 117 mg/dL — ABNORMAL HIGH (ref 70–99)
POTASSIUM: 4.2 mmol/L (ref 3.5–5.1)
Sodium: 137 mmol/L (ref 135–145)
TOTAL PROTEIN: 5.8 g/dL — AB (ref 6.5–8.1)

## 2018-11-02 MED ORDER — OXYCODONE HCL 5 MG PO TABS: 5.0000 mg | ORAL_TABLET | Freq: Four times a day (QID) | ORAL | 0 refills | Status: AC | PRN

## 2018-11-02 MED ORDER — GUAIFENESIN 100 MG/5ML PO SOLN: 5.0000 mL | ORAL | 0 refills | Status: AC | PRN

## 2018-11-02 MED ORDER — OXYCODONE HCL 5 MG PO TABS: 5.0000 mg | ORAL_TABLET | Freq: Four times a day (QID) | ORAL | 0 refills | Status: DC | PRN

## 2018-11-02 MED ORDER — CARVEDILOL 3.125 MG PO TABS: 3.1250 mg | ORAL_TABLET | Freq: Two times a day (BID) | ORAL | Status: AC

## 2018-11-02 MED ORDER — SENNOSIDES-DOCUSATE SODIUM 8.6-50 MG PO TABS: 2.0000 | ORAL_TABLET | Freq: Every evening | ORAL | Status: AC | PRN

## 2018-11-02 MED ORDER — CLOPIDOGREL BISULFATE 75 MG PO TABS: 75.0000 mg | ORAL_TABLET | Freq: Every day | ORAL | Status: AC

## 2018-11-02 MED ORDER — ALBUTEROL SULFATE (2.5 MG/3ML) 0.083% IN NEBU: 2.5000 mg | INHALATION_SOLUTION | RESPIRATORY_TRACT | 12 refills | Status: AC | PRN

## 2018-11-02 MED ORDER — RANOLAZINE ER 500 MG PO TB12: 500.0000 mg | ORAL_TABLET | Freq: Two times a day (BID) | ORAL | Status: DC

## 2018-11-02 MED ORDER — OXYCODONE HCL 5 MG PO TABS
5.0000 mg | ORAL_TABLET | Freq: Four times a day (QID) | ORAL | Status: DC | PRN
  Administered 2018-11-02: 5 mg via ORAL
  Filled 2018-11-02: qty 1

## 2018-11-02 NOTE — Telephone Encounter (Signed)
° ° °  TOC appt requested by Lynnette CaffeyKrista K.   [facility]

## 2018-11-02 NOTE — Progress Notes (Signed)
CSW had extensive conversation with patient at bedside, with patient's caregiver [place] on speaker phone. Patient hesitant to make a decision on whether to go home or go back to SNF, stating that she wanted to talk to her son first. Leavy CellaJasmine indicated she has tried to reach son and has not been able to. Son's phone not working.  Patient did eventually decide to go back to [facility]. CSW confirmed patient's bed at the SNF and they are ready to take patient back today.  Notified MD. CSW to support with discharge today.  Abigail ButtsSusan Sheree Lalla, LCSWA [phone]

## 2018-11-02 NOTE — Progress Notes (Signed)
Progress Note  Patient Name: Mary Nguyen Date of Encounter: 11/02/2018  Primary Cardiologist:   Chrystie Nose, MD   Subjective   Continued chest pain pattern that is relieved by morphine.  No distress this AM.   Inpatient Medications    Scheduled Meds: . aspirin EC  81 mg Oral Daily  . atorvastatin  80 mg Oral q1800  . busPIRone  10 mg Oral TID  . carvedilol  3.125 mg Oral BID WC  . clopidogrel  75 mg Oral Daily  . gabapentin  300 mg Oral TID  . heparin  5,000 Units Subcutaneous Q8H  . isosorbide dinitrate  10 mg Oral TID  . mirtazapine  7.5 mg Oral QHS  . oxybutynin  5 mg Oral BID  . pantoprazole  20 mg Oral Daily  . ranolazine  500 mg Oral BID  . senna-docusate  2 tablet Oral BID   Continuous Infusions:  PRN Meds: acetaminophen **OR** acetaminophen, albuterol, guaiFENesin, morphine injection, ondansetron (ZOFRAN) IV   Vital Signs    Vitals:   11/01/18 0611 11/01/18 1425 11/01/18 2157 11/02/18 0645  BP: 97/68 (!) 156/55 (!) 156/86 (!) 143/74  Pulse: 67 70 72 78  Resp:      Temp: 98.4 F (36.9 C) 98 F (36.7 C) 98.7 F (37.1 C) 98.4 F (36.9 C)  TempSrc: Oral Oral Oral Oral  SpO2: 97% 95% 91% 95%  Weight: 45.5 kg   45.8 kg  Height:        Intake/Output Summary (Last 24 hours) at 11/02/2018 0904 Last data filed at 11/02/2018 0657 Gross per 24 hour  Intake 960 ml  Output 700 ml  Net 260 ml   Filed Weights   10/30/18 0552 11/01/18 0611 11/02/18 0645  Weight: 46.9 kg 45.5 kg 45.8 kg    Telemetry    NSR  - Personally Reviewed  ECG    NA - Personally Reviewed  Physical Exam   GEN: No  acute distress.   Neck: No  JVD Cardiac: RRR, no murmurs, rubs, or gallops.  Respiratory: Clear  to auscultation bilaterally. GI: Soft, nontender, non-distended, normal bowel sounds  MS:  No edema; No deformity. Neuro:   Nonfocal  Psych: Oriented and appropriate   Labs    Chemistry Recent Labs  Lab 10/29/18 0109 10/30/18 0312 11/01/18 0459  11/02/18 0429  NA 139 140  --  137  K 4.0 3.8  --  4.2  CL 112* 112*  --  107  CO2 20* 22  --  23  GLUCOSE 102* 113*  --  117*  BUN 19 18  --  21  CREATININE 0.87 0.83 1.05* 1.16*  CALCIUM 8.5* 8.5*  --  8.6*  PROT 6.0* 5.6*  --  5.8*  ALBUMIN 3.2* 2.9*  --  2.8*  AST 60* 75*  --  32  ALT 66* 93*  --  58*  ALKPHOS 123 135*  --  124  BILITOT 0.3 0.4  --  0.5  GFRNONAA >60 >60 55* 49*  GFRAA >60 >60 >60 57*  ANIONGAP 7 6  --  7     Hematology Recent Labs  Lab 10/31/18 0341 11/01/18 0450 11/02/18 0429  WBC 7.4 7.1 7.2  RBC 3.00* 3.32* 3.09*  HGB 9.9* 10.9* 10.2*  HCT 31.3* 34.9* 32.5*  MCV 104.3* 105.1* 105.2*  MCH 33.0 32.8 33.0  MCHC 31.6 31.2 31.4  RDW 13.9 13.9 13.8  PLT 191 205 195    Cardiac Enzymes Recent Labs  Lab 10/28/18 1318 10/28/18 1918 10/29/18 0109 10/29/18 1129  TROPONINI 0.07* 0.05* 0.05* 0.03*    Recent Labs  Lab 10/28/18 0554 10/28/18 0723 10/28/18 0920  TROPIPOC 0.01 0.03 0.05     BNPNo results for input(s): BNP, PROBNP in the last 168 hours.   DDimer  Recent Labs  Lab 10/28/18 0550  DDIMER 1.24*     Radiology    No results found.  Cardiac Studies   NA  Patient Profile     67 y.o. female with unstable angina and presumed severe CAD (unable to perform cardiac cath due to lack of vascular access), aorto-iliac occlusion, hypercholesterolemia   Assessment & Plan    CHEST PAIN:     She tolerated three doses of nitrates yesterday.  Heparin full dose stopped yesterday.  One dose of beta blocker held yesterday.  No change in meds.   ABDOMINAL PAIN:    This seems to be better.  She thinks she will have a BM today.  We will see if this resolves the lingering pain.   For questions or updates, please contact [facility] Please consult [email or web address] for contact info under Cardiology/STEMI.   Signed, Rollene RotundaJames Latia Mataya, MD  11/02/2018, 9:04 AM

## 2018-11-02 NOTE — Care Management Note (Signed)
Case Management Note  Patient Details  Name: Mary Nguyen MRN: [id number] Date of Birth: [birth year]  Subjective/Objective: Pt presented for Nstemi-plan for medical manage. [facility]- CSW following for disposition needs to see if patient is agreeable to return.                      Action/Plan: CM will continue to monitor for any additional needs.   Expected Discharge Date:               Expected Discharge Plan:  Skilled Nursing Facility  In-House Referral:  Clinical Social Work  Discharge planning Services  CM Consult  Post Acute Care Choice:    Choice offered to:     DME Arranged:    DME Agency:     HH Arranged:    HH Agency:     Status of Service:  In process, will continue to follow  If discussed at Long Length of Stay Meetings, dates discussed:    Additional Comments:  Mary Nguyen, Mary Broker Kaye, RN 11/02/2018, 11:26 AM

## 2018-11-02 NOTE — Progress Notes (Signed)
[facility] will [facility] Anticipated discharge date: 11/02/18 Family notified: Leota JacobsenJasmine Whack, caregiver Transportation by: Sharin MonsPTAR  Nurse to call report to [phone].  CSW signing off.  Abigail ButtsSusan Hibba Schram, LCSWA  Clinical Social Worker

## 2018-11-02 NOTE — Progress Notes (Signed)
OT Cancellation Note  Patient Details Name: Mary Nguyen MRN: [id number] DOB: 1951-04-20   Cancelled Treatment:    Reason Eval/Treat Not Completed: Other (comment) Pt is Medicare and current D/C plan is SNF with pt D/C'ing today.No apparent immediate acute care OT needs, therefore will defer OT to SNF. If OT eval is needed please call Acute Rehab Dept. at [phone] or text page OT at [phone].   [name] [name], OTR/L Acute Rehab Services Pager [phone] Office [phone]      [name], [name] Eva  11/02/2018, 1:49 PM

## 2018-11-02 NOTE — Progress Notes (Signed)
   Subjective: [name]. Intermittent chest pain relieved by morphine. Feels like she will have a BM today. Discussed discharge planning  Objective:  Vital signs in last 24 hours: Vitals:   11/01/18 0611 11/01/18 1425 11/01/18 2157 11/02/18 0645  BP: 97/68 (!) 156/55 (!) 156/86 (!) 143/74  Pulse: 67 70 72 78  Resp:      Temp: 98.4 F (36.9 C) 98 F (36.7 C) 98.7 F (37.1 C) 98.4 F (36.9 C)  TempSrc: Oral Oral Oral Oral  SpO2: 97% 95% 91% 95%  Weight: 45.5 kg   45.8 kg  Height:       Gen: sitting on edge of bed eating breakfast Cardiac: RRR Pulm: diffuse expiratory wheezing, good air movement, normal work of breathing Abd: firm, non distended, active bowel sounds, slightly diffuse tenderness to palpation without guarding  Assessment/Plan:  Principal Problem:   Non-ST elevation MI (NSTEMI) (HCC) Active Problems:   Goals of care, counseling/discussion   Peripheral vascular occlusive disease (HCC)   Acute on chronic combined systolic and diastolic heart failure (HCC)   Advanced care planning/counseling discussion   Upper abdominal pain  1. Abdominal pain: likely 2/2 constipation in the setting of opiate use. Increased senna yesterday and she feels like she will have a BM today. If no BM, can add miralax.   2. Chest pain: appreciate cardiology's assistance. tolerating oral nitrates. Will switch IV morphine to po oxycodone today. If pain is not controlled on po oxy, can add po morphine for breakthrough.   Dispo: Anticipated discharge when SNF placement is ready. She is medically ready for discharge.   [name],  S, MD 11/02/2018, 10:00 AM Pager: [phone]

## 2018-11-02 NOTE — Progress Notes (Signed)
Medicine attending: I examined this patient today together with resident physician Dr Maryelizabeth KaufmannMarie Vogel and I concur with her evaluation and management plan. We appreciate ongoing input from cardiology. She is tolerating current doses of nitrates.  Blood pressure stable to increased. She had some transient abdominal discomfort yesterday.  Better today.  Abdomen is diffusely firm but soft and nontender.  Suspect this is all stool.  Normal bowel sounds. We discussed with her transitioning to an oral narcotic for symptomatic treatment of her chest pain.  I would recommend oxycodone.  She understands that she will not be able to have parenteral narcotics either in a nursing home or if she decides to go back to living in a hotel. Discharge planning in progress.  I encouraged her to go to a skilled nursing facility rather than a hotel.

## 2018-11-02 NOTE — Clinical Social Work Placement (Signed)
   CLINICAL SOCIAL WORK PLACEMENT  NOTE  Date:  11/02/2018  Patient Details  Name: Mary Nguyen MRN: [id number] Date of Birth: 1951/01/11  Clinical Social Work is seeking post-discharge placement for this patient at the Skilled  Nursing Facility level of care (*CSW will initial, date and re-position this form in  chart as items are completed):  Yes   Patient/family provided with [facility] Clinical Social Work Department's list of facilities offering this level of care within the geographic area requested by the patient (or if unable, by the patient's family).  Yes   Patient/family informed of their freedom to choose among providers that offer the needed level of care, that participate in Medicare, Medicaid or managed care program needed by the patient, have an available bed and are willing to accept the patient.  Yes   Patient/family informed of [facility] ownership interest in [facility] and [facility], as well as of the fact that they are under no obligation to receive care at these facilities.  PASRR submitted to EDS on       PASRR number received on       Existing PASRR number confirmed on 11/01/18     FL2 transmitted to all facilities in geographic area requested by pt/family on 11/01/18     FL2 transmitted to all facilities within larger geographic area on       Patient informed that his/her managed care company has contracts with or will negotiate with certain facilities, including the following:  [facility]     Yes   Patient/family informed of bed offers received.  [facility]     Physician recommends and patient chooses bed at      [facility].  Patient to be transferred to facility by PTAR     Patient family notified on 11/02/18 of transfer.  Name of family member notified:  Leota JacobsenJasmine Whack, caregiver     PHYSICIAN Please prepare priority discharge summary, including medications,  Please prepare prescriptions     Additional Comment:    _______________________________________________ Abigail ButtsSusan Zenna Traister, LCSW 11/02/2018, 12:49 PM

## 2018-11-07 NOTE — Telephone Encounter (Signed)
/  Patient contacted regarding discharge from [facility] on 11/02/18  Patient understands to follow up with provider Azalee CourseHao Meng, PA on 11/16/18 at 10:30 at [facility]. Patient understands discharge instructions? According to facility, no Patient understands medications and regiment? According to facility, no Patient understands to bring all medications to this visit? According to facility, no  Spoke with Facility that patient is currently at. Advised that if patient or nurses had questions on discharge they would call us back. Nurses station advised they would give appointment details to patient.   Attempted to contact mobile number on file, but was unavailable.

## 2018-11-16 ENCOUNTER — Encounter: Payer: Self-pay | Admitting: Physician Assistant

## 2018-11-16 ENCOUNTER — Ambulatory Visit (INDEPENDENT_AMBULATORY_CARE_PROVIDER_SITE_OTHER): Payer: Medicare Other | Admitting: Physician Assistant

## 2018-11-16 VITALS — BP 126/84 | HR 58 | Ht 62.0 in | Wt 99.0 lb

## 2018-11-16 DIAGNOSIS — I739 Peripheral vascular disease, unspecified: Secondary | ICD-10-CM | POA: Diagnosis not present

## 2018-11-16 DIAGNOSIS — I200 Unstable angina: Secondary | ICD-10-CM

## 2018-11-16 DIAGNOSIS — N179 Acute kidney failure, unspecified: Secondary | ICD-10-CM | POA: Diagnosis not present

## 2018-11-16 DIAGNOSIS — I25118 Atherosclerotic heart disease of native coronary artery with other forms of angina pectoris: Secondary | ICD-10-CM

## 2018-11-16 DIAGNOSIS — H543 Unqualified visual loss, both eyes: Secondary | ICD-10-CM

## 2018-11-16 DIAGNOSIS — E785 Hyperlipidemia, unspecified: Secondary | ICD-10-CM

## 2018-11-16 LAB — BASIC METABOLIC PANEL
BUN/Creatinine Ratio: 21 (ref 12–28)
BUN: 20 mg/dL (ref 8–27)
CO2: 21 mmol/L (ref 20–29)
CREATININE: 0.95 mg/dL (ref 0.57–1.00)
Calcium: 9.9 mg/dL (ref 8.7–10.3)
Chloride: 102 mmol/L (ref 96–106)
GFR calc non Af Amer: 63 mL/min/{1.73_m2} (ref >59)
GFR, EST AFRICAN AMERICAN: 72 mL/min/{1.73_m2} (ref >59)
Glucose: 63 mg/dL — ABNORMAL LOW (ref 65–99)
POTASSIUM: 4.6 mmol/L (ref 3.5–5.2)
Sodium: 142 mmol/L (ref 134–144)

## 2018-11-16 MED ORDER — RANOLAZINE ER 1000 MG PO TB12: 1000.0000 mg | ORAL_TABLET | Freq: Two times a day (BID) | ORAL | 0 refills | Status: AC

## 2018-11-16 NOTE — Patient Instructions (Signed)
Medication Instructions:  Increase Ranolazine 1000 mg twice daily. If you need a refill on your cardiac medications before your next appointment, please call your pharmacy.   Lab work: BMET If you have labs (blood work) drawn today and your tests are completely normal, you will receive your results only by: Marland Kitchen. MyChart Message (if you have MyChart) OR . A paper copy in the mail If you have any lab test that is abnormal or we need to change your treatment, we will call you to review the results.  Testing/Procedures: None Ordered.  Follow-Up: At [facility], you and your health needs are our priority.  As part of our continuing mission to provide you with exceptional heart care, we have created designated Provider Care Teams.  These Care Teams include your primary Cardiologist (physician) and Advanced Practice Providers (APPs -  Physician Assistants and Nurse Practitioners) who all work together to provide you with the care you need, when you need it. You will need a follow up appointment in 6-8 weeks.  Please call our office 2 months in advance to schedule this appointment.  You may see Mary NoseKenneth C Hilty, MD or one of the following Advanced Practice Providers on your designated Care Team: [place]ao Meng, [place]-C . Micah FlesherAngela Duke, PA-C  Any Other Special Instructions Will Be Listed Below (If Applicable). None

## 2018-11-16 NOTE — Progress Notes (Signed)
Cardiology Office Note    Date:  11/18/2018   ID:  [name], DOB [birth year], MRN [id number]  PCP:  Durenda Hurt, Flossie Buffy, MD  Cardiologist: Dr. Rennis Golden  Chief Complaint  Patient presents with  . Follow-up    BP dropped to 88/48 last night.  . Dizziness  . Chest Pain    History of Present Illness:  Mary Nguyen is a 67 y.o. female with past medical history of significant peripheral arterial disease, suspected coronary artery disease with inability to perform cardiac catheterization due to vasculopath, multiple CVA, blindness, untreated hyperlipidemia, significant carotid artery disease and chronic chest pain.  Patient has been evaluated on multiple occasions in [place] at [facility] and at [facility] in April 2019 for chest pain.  Cardiac catheterization was planned however aborted due to inability to gain access during the cardiac catheterization procedure due to significant peripheral arterial disease.  Patient was admitted for chest pain and pneumonia in late September.  Troponin was negative.  Chest x-ray and a CT scan indicated lobar pneumonia.  She was treated with Levaquin.  She also complained of diarrhea and lower abdominal pain.  Diarrhea resolved during the hospitalization.  She has some bradycardia during the hospitalization, this was evaluated by cardiology, no pacemaker was recommended at the time.  Patient was readmitted on 10/29/2018 with recurrent chest pain while working with physical therapy.  EKG demonstrated marked bradycardia with heart rate of 39.  She was managed medically so far she had to attempt a cardiac catheterization 2 different facilities and both were unsuccessful.  Palliative care was consulted and end-of-life discussion was made.  She was started on Ranexa and isosorbide dinitrate.  Patient presents today for cardiology office visit.  She is currently on 30 mg daily of Imdur, 3.125 mg twice daily of carvedilol and 500 mg  twice daily of Ranexa.  I will increase Ranexa to 1000 mg twice daily.  Since discharge, she has roughly twice weekly episode of chest discomfort.  Each episode only last a few minutes at a time.  She has not taken any nitroglycerin since she got home.  She has no lower extremity edema, orthopnea or PND.  She occasionally has transient hypotensive episode, however has been largely asymptomatic with this. She currently resides in [facility] and receives PT and OT there.  Review of the recent lab work, she had mildly elevated creatinine compared to her baseline right before the discharge, I will obtain a basic metabolic panel.  Otherwise she will require close follow-up, I will set her up to see Dr. Rennis Golden in 6 to 8 weeks.   Past Medical History:  Diagnosis Date  . Blindness   . Bradycardia    a. pt reports hx of slow HR.  Marland Kitchen CAD in native artery    a. pt reports "blockage in the back of her heart" sometime in 2018 by cath at [facility].  . Carotid artery disease (HCC)    a. pt reports "blockage scraped" R neck artery around 2000.  . Cataract   . Former tobacco use   . Heart murmur   . Hyperlipidemia   . PAD (peripheral artery disease) (HCC)    a. she was told she had poor circulation from the waist down.  . Skipped heart beats   . Stroke [facility])    a. multiple strokes - 4 total, first one in her R eye in 2002, most recent one just a few months ago (as  of 03/2018).    Past Surgical History:  Procedure Laterality Date  . LEFT HEART CATH AND CORONARY ANGIOGRAPHY N/A 04/04/2018   Procedure: LEFT HEART CATH AND CORONARY ANGIOGRAPHY;  Surgeon: Lyn RecordsSmith, Henry W, MD;  Location: [facility] INVASIVE CV LAB;  Service: Cardiovascular;  Laterality: N/A;  . TUBAL LIGATION      Current Medications: Outpatient Medications Prior to Visit  Medication Sig Dispense Refill  . acetaminophen (TYLENOL) 325 MG tablet Take 2 tablets (650 mg total) by mouth every 6 (six) hours.    Marland Kitchen. albuterol (PROVENTIL)  (2.5 MG/3ML) 0.083% nebulizer solution Take 3 mLs (2.5 mg total) by nebulization every 4 (four) hours as needed for wheezing or shortness of breath. 75 mL 12  . aspirin EC 81 MG EC tablet Take 1 tablet (81 mg total) by mouth daily. 30 tablet 0  . atorvastatin (LIPITOR) 80 MG tablet Take 1 tablet (80 mg total) by mouth daily at 6 PM. 30 tablet 0  . busPIRone (BUSPAR) 5 MG tablet Take 10 mg by mouth 3 (three) times daily.    . calcium-vitamin D (OSCAL WITH D) 500-200 MG-UNIT tablet Take 1 tablet by mouth daily with breakfast. 30 tablet 0  . carvedilol (COREG) 3.125 MG tablet Take 1 tablet (3.125 mg total) by mouth 2 (two) times daily with a meal.    . clopidogrel (PLAVIX) 75 MG tablet Take 1 tablet (75 mg total) by mouth daily.    Marland Kitchen. guaiFENesin (ROBITUSSIN) 100 MG/5ML SOLN Take 5 mLs (100 mg total) by mouth every 4 (four) hours as needed for cough or to loosen phlegm. 1200 mL 0  . isosorbide mononitrate (IMDUR) 30 MG 24 hr tablet Take 1 tablet (30 mg total) by mouth daily. 30 tablet 0  . Melatonin 3 MG TABS Take 3 mg by mouth at bedtime.    . mirtazapine (REMERON) 7.5 MG tablet Take 1 tablet (7.5 mg total) by mouth at bedtime. 30 tablet 0  . nitroGLYCERIN (NITROSTAT) 0.4 MG SL tablet Place 1 tablet (0.4 mg total) under the tongue every 5 (five) minutes as needed for chest pain. 30 tablet 0  . oxybutynin (DITROPAN) 5 MG tablet Take 5 mg by mouth 2 (two) times daily.    Marland Kitchen. oxyCODONE (OXY IR/ROXICODONE) 5 MG immediate release tablet Take 1 tablet (5 mg total) by mouth every 6 (six) hours as needed (pain). 30 tablet 0  . pantoprazole (PROTONIX) 20 MG tablet Take 1 tablet (20 mg total) by mouth daily. 30 tablet 0  . polyethylene glycol (MIRALAX / GLYCOLAX) packet Take 17 g by mouth daily as needed for mild constipation. 14 each 0  . senna-docusate (SENOKOT-S) 8.6-50 MG tablet Take 2 tablets by mouth at bedtime as needed for mild constipation. 30 tablet   . ranolazine (RANEXA) 500 MG 12 hr tablet Take 1  tablet (500 mg total) by mouth 2 (two) times daily.    Marland Kitchen. gabapentin (NEURONTIN) 300 MG capsule Take 1 capsule (300 mg total) by mouth 3 (three) times daily for 7 days. 21 capsule 0  . Misc. Devices (BATH/SHOWER SEAT) MISC Shower seat (Patient not taking: Reported on 10/28/2018) 1 each 0   No facility-administered medications prior to visit.      Allergies:   Penicillins   Social History   Socioeconomic History  . Marital status: Divorced    Spouse name: Not on file  . Number of children: Not on file  . Years of education: Not on file  . Highest education level: Not on  file  Occupational History  . Not on file  Social Needs  . Financial resource strain: Not on file  . Food insecurity:    Worry: Not on file    Inability: Not on file  . Transportation needs:    Medical: Not on file    Non-medical: Not on file  Tobacco Use  . Smoking status: Current Every Day Smoker    Packs/day: 0.50    Years: 10.00    Pack years: 5.00    Types: Cigarettes  . Smokeless tobacco: Never Used  . Tobacco comment: Pt reports she recently quit after 45 years but family says she still smokes  Substance and Sexual Activity  . Alcohol use: Not Currently  . Drug use: Not Currently  . Sexual activity: Not Currently  Lifestyle  . Physical activity:    Days per week: Not on file    Minutes per session: Not on file  . Stress: Not on file  Relationships  . Social connections:    Talks on phone: Not on file    Gets together: Not on file    Attends religious service: Not on file    Active member of club or organization: Not on file    Attends meetings of clubs or organizations: Not on file    Relationship status: Not on file  Other Topics Concern  . Not on file  Social History Narrative  . Not on file     Family History:  The patient's family history includes Heart disease in her father; Hypertension in her mother.   ROS:   Please see the history of present illness.    ROS All other systems  reviewed and are negative.   PHYSICAL EXAM:   VS:  BP 126/84 (BP Location: Left Arm, Patient Position: Sitting, Cuff Size: Normal)   Pulse (!) 58   Ht 5\' 2"  (1.575 m)   Wt 99 lb (44.9 kg)   BMI 18.11 kg/m    GEN: Well nourished, well developed, in no acute distress  HEENT: normal  Neck: no JVD, carotid bruits, or masses Cardiac: RRR; no murmurs, rubs, or gallops,no edema  Respiratory:  clear to auscultation bilaterally, normal work of breathing GI: soft, nontender, nondistended, + BS MS: no deformity or atrophy  Skin: warm and dry, no rash Neuro:  Alert and Oriented x 3, Strength and sensation are intact Psych: euthymic mood, full affect  Wt Readings from Last 3 Encounters:  11/16/18 99 lb (44.9 kg)  11/02/18 101 lb (45.8 kg)  09/04/18 73 lb 13.7 oz (33.5 kg)      Studies/Labs Reviewed:   EKG:  EKG is not ordered today.    Recent Labs: 04/02/2018: TSH 1.754 11/02/2018: ALT 58; Hemoglobin 10.2; Platelets 195 11/16/2018: BUN 20; Creatinine, Ser 0.95; Potassium 4.6; Sodium 142   Lipid Panel    Component Value Date/Time   CHOL 227 (H) 04/02/2018 0206   TRIG 65 04/02/2018 0206   HDL 41 04/02/2018 0206   CHOLHDL 5.5 04/02/2018 0206   VLDL 13 04/02/2018 0206   LDLCALC 173 (H) 04/02/2018 0206    Additional studies/ records that were reviewed today include:   Echo 04/02/2018 LV EF: 60% -   65% Study Conclusions  - Left ventricle: The cavity size was normal. Systolic function was   normal. The estimated ejection fraction was in the range of 60%   to 65%. Wall motion was normal; there were no regional wall   motion abnormalities. Doppler parameters are consistent with  abnormal left ventricular relaxation (grade 1 diastolic   dysfunction). There was no evidence of elevated ventricular   filling pressure by Doppler parameters. - Mitral valve: Calcified annulus. Mildly thickened leaflets .   There was trivial regurgitation. - Left atrium: The atrium was normal in  size. - Right ventricle: The cavity size was normal. Wall thickness was   normal. Systolic function was normal. - Right atrium: The atrium was normal in size. - Tricuspid valve: There was mild regurgitation. - Pulmonary arteries: Systolic pressure was within the normal   range. - Inferior vena cava: The vessel was normal in size. - Pericardium, extracardiac: There was no pericardial effusion.    ASSESSMENT:    1. Coronary artery disease of native artery of native heart with stable angina pectoris (HCC)   2. AKI (acute kidney injury) (HCC)   3. PAD (peripheral artery disease) (HCC)   4. Blindness of both eyes   5. Hyperlipidemia LDL goal <70      PLAN:  In order of problems listed above:  1. CAD: Presumed CAD as patient is unable to undergo cardiac catheterization due to severe peripheral arterial disease.  Anginal symptoms seems to be stable at this time.  We will continue to increase Ranexa to 1000 mg twice daily.  Continue on aspirin, Plavix and the low-dose Imdur.  Unable to uptitrate medication with episodic hypotension.   2. AKI: Obtain basic metabolic panel to follow up on renal function  3. PAD: Patient is a vasculopath with significant carotid artery disease and lower extremity peripheral arterial disease that prohibit her from undergoing any cardiac catheterization.  4. Hyperlipidemia: On Lipitor 80 mg daily.  Recheck fasting lipid panel on the next office visit.    Medication Adjustments/Labs and Tests Ordered: Current medicines are reviewed at length with the patient today.  Concerns regarding medicines are outlined above.  Medication changes, Labs and Tests ordered today are listed in the Patient Instructions below. Patient Instructions  Medication Instructions:  Increase Ranolazine 1000 mg twice daily. If you need a refill on your cardiac medications before your next appointment, please call your pharmacy.   Lab work: BMET If you have labs (blood work) drawn  today and your tests are completely normal, you will receive your results only by: Marland Kitchen MyChart Message (if you have MyChart) OR . A paper copy in the mail If you have any lab test that is abnormal or we need to change your treatment, we will call you to review the results.  Testing/Procedures: None Ordered.  Follow-Up: At [facility], you and your health needs are our priority.  As part of our continuing mission to provide you with exceptional heart care, we have created designated Provider Care Teams.  These Care Teams include your primary Cardiologist (physician) and Advanced Practice Providers (APPs -  Physician Assistants and Nurse Practitioners) who all work together to provide you with the care you need, when you need it. You will need a follow up appointment in 6-8 weeks.  Please call our office 2 months in advance to schedule this appointment.  You may see Chrystie Nose, MD or one of the following Advanced Practice Providers on your designated Care Team: [place], [place] . Micah Flesher, PA-C  Any Other Special Instructions Will Be Listed Below (If Applicable). None      Ramond Dial, [place]  11/18/2018 11:30 PM    [facility] [address] [place], [place], [place]  [address] Phone: [phone]; Fax: [phone]   

## 2018-11-18 ENCOUNTER — Encounter: Payer: Self-pay | Admitting: Physician Assistant

## 2018-11-21 NOTE — Progress Notes (Signed)
Kidney function improved back to baseline, electrolyte ok

## 2018-11-22 ENCOUNTER — Encounter (HOSPITAL_COMMUNITY): Payer: Self-pay | Admitting: Emergency Medicine

## 2018-11-22 ENCOUNTER — Emergency Department (HOSPITAL_COMMUNITY)
Admission: EM | Admit: 2018-11-22 | Discharge: 2018-11-22 | Disposition: A | Discharge status: Home / Self Care | Payer: Medicare Other | Attending: Emergency Medicine | Admitting: Emergency Medicine

## 2018-11-22 ENCOUNTER — Emergency Department (HOSPITAL_COMMUNITY): Payer: Medicare Other

## 2018-11-22 DIAGNOSIS — Z79899 Other long term (current) drug therapy: Secondary | ICD-10-CM | POA: Diagnosis not present

## 2018-11-22 DIAGNOSIS — F17210 Nicotine dependence, cigarettes, uncomplicated: Secondary | ICD-10-CM | POA: Diagnosis not present

## 2018-11-22 DIAGNOSIS — R519 Headache, unspecified: Secondary | ICD-10-CM

## 2018-11-22 DIAGNOSIS — I2510 Atherosclerotic heart disease of native coronary artery without angina pectoris: Secondary | ICD-10-CM | POA: Insufficient documentation

## 2018-11-22 DIAGNOSIS — R51 Headache: Secondary | ICD-10-CM | POA: Insufficient documentation

## 2018-11-22 DIAGNOSIS — R251 Tremor, unspecified: Secondary | ICD-10-CM | POA: Diagnosis present

## 2018-11-22 LAB — CBC WITH DIFFERENTIAL/PLATELET
ABS IMMATURE GRANULOCYTES: 0.04 10*3/uL (ref 0.00–0.07)
BASOS PCT: 0 %
Basophils Absolute: 0 10*3/uL (ref 0.0–0.1)
EOS PCT: 2 %
Eosinophils Absolute: 0.2 10*3/uL (ref 0.0–0.5)
HCT: 38.7 % (ref 36.0–46.0)
Hemoglobin: 12.1 g/dL (ref 12.0–15.0)
Immature Granulocytes: 1 %
Lymphocytes Relative: 24 %
Lymphs Abs: 2 10*3/uL (ref 0.7–4.0)
MCH: 31.3 pg (ref 26.0–34.0)
MCHC: 31.3 g/dL (ref 30.0–36.0)
MCV: 100.3 fL — AB (ref 80.0–100.0)
Monocytes Absolute: 1.1 10*3/uL — ABNORMAL HIGH (ref 0.1–1.0)
Monocytes Relative: 13 %
NEUTROS ABS: 5.2 10*3/uL (ref 1.7–7.7)
Neutrophils Relative %: 60 %
PLATELETS: 266 10*3/uL (ref 150–400)
RBC: 3.86 MIL/uL — ABNORMAL LOW (ref 3.87–5.11)
RDW: 12.8 % (ref 11.5–15.5)
WBC: 8.5 10*3/uL (ref 4.0–10.5)
nRBC: 0 % (ref 0.0–0.2)

## 2018-11-22 LAB — URINALYSIS, ROUTINE W REFLEX MICROSCOPIC
BILIRUBIN URINE: NEGATIVE
Glucose, UA: NEGATIVE mg/dL
Hgb urine dipstick: NEGATIVE
KETONES UR: NEGATIVE mg/dL
Leukocytes, UA: NEGATIVE
Nitrite: NEGATIVE
Protein, ur: NEGATIVE mg/dL
SPECIFIC GRAVITY, URINE: 1.02 (ref 1.005–1.030)
pH: 6 (ref 5.0–8.0)

## 2018-11-22 LAB — COMPREHENSIVE METABOLIC PANEL
ALBUMIN: 3.5 g/dL (ref 3.5–5.0)
ALT: UNDETERMINED U/L (ref 0–44)
AST: 24 U/L (ref 15–41)
Alkaline Phosphatase: 88 U/L (ref 38–126)
Anion gap: 14 (ref 5–15)
BILIRUBIN TOTAL: UNDETERMINED mg/dL (ref 0.3–1.2)
BUN: 21 mg/dL (ref 8–23)
CO2: 21 mmol/L — ABNORMAL LOW (ref 22–32)
CREATININE: 1.06 mg/dL — AB (ref 0.44–1.00)
Calcium: 9.6 mg/dL (ref 8.9–10.3)
Chloride: 104 mmol/L (ref 98–111)
GFR calc Af Amer: >60 mL/min (ref >60)
GFR, EST NON AFRICAN AMERICAN: 55 mL/min — AB (ref >60)
GLUCOSE: 98 mg/dL (ref 70–99)
POTASSIUM: 4.4 mmol/L (ref 3.5–5.1)
Sodium: 139 mmol/L (ref 135–145)
TOTAL PROTEIN: 7.1 g/dL (ref 6.5–8.1)

## 2018-11-22 MED ORDER — ACETAMINOPHEN 500 MG PO TABS
1000.0000 mg | ORAL_TABLET | Freq: Once | ORAL | Status: AC
  Administered 2018-11-22: 1000 mg via ORAL
  Filled 2018-11-22: qty 2

## 2018-11-22 NOTE — ED Provider Notes (Signed)
[facility] EMERGENCY DEPARTMENT Provider Note   CSN: [id number] Arrival date & time: 11/22/18  1328     History   Chief Complaint Chief Complaint  Patient presents with  . Aphasia  . Tremors    HPI Mary Nguyen is a 67 y.o. female.  66yo F w/ PMH including CAD, CVA w/ R sided weakness, blindness, carotid artery disease who p/w fall and speech problems. 2 days ago, pt was at nursing facility and fell backwards, striking the back of her head. She had an outpatient head CT that was reportedly negative. She reports headache and soreness in the back of her head since the fall. This morning around 9am, she noticed her chronic R sided arm contracture seemed to be worse and she felt like she was having slurred speech and tremors. EMS noted some facial tremors but no seizure activity. She denies any CP or SOB. She has chronic dysuria.   The history is provided by the patient.    Past Medical History:  Diagnosis Date  . Blindness   . Bradycardia    a. pt reports hx of slow HR.  Marland Kitchen CAD in native artery    a. pt reports "blockage in the back of her heart" sometime in 2018 by cath at [facility].  . Carotid artery disease (HCC)    a. pt reports "blockage scraped" R neck artery around 2000.  . Cataract   . Former tobacco use   . Heart murmur   . Hyperlipidemia   . PAD (peripheral artery disease) (HCC)    a. she was told she had poor circulation from the waist down.  . Skipped heart beats   . Stroke [facility])    a. 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    Patient Active Problem List   Diagnosis Date Noted  . Constipation   . Upper abdominal pain   . Advanced care planning/counseling discussion   . Acute on chronic combined systolic and diastolic heart failure (HCC) 10/29/2018  . Chest pain 10/28/2018  . Non-ST elevation MI (NSTEMI) (HCC)   . Peripheral vascular occlusive disease (HCC)    . PVD (peripheral vascular disease) (HCC)   . Failure to thrive in adult   . Palliative care by specialist   . Goals of care, counseling/discussion   . Depression   . CAP (community acquired pneumonia) 08/31/2018  . Hypokalemia   . Multifocal pneumonia   . High risk social situation   . Blind   . Tobacco abuse   . Osteoporosis with current pathological fracture   . Protein-calorie malnutrition (HCC)   . Angina at rest [facility]) 04/04/2018  . Precordial pain   . History of stroke 04/02/2018  . Hyperlipidemia 04/02/2018  . Unstable angina (HCC) 04/01/2018    Past Surgical History:  Procedure Laterality Date  . LEFT HEART CATH AND CORONARY ANGIOGRAPHY N/A 04/04/2018   Procedure: LEFT HEART CATH AND CORONARY ANGIOGRAPHY;  Surgeon: Lyn Records, MD;  Location: [facility] INVASIVE CV LAB;  Service: Cardiovascular;  Laterality: N/A;  . TUBAL LIGATION       OB History   No obstetric history on file.      Home Medications    Prior to Admission medications   Medication Sig Start Date End Date Taking? Authorizing Provider  acetaminophen (TYLENOL) 325 MG tablet Take 2 tablets (650 mg total) by mouth every 6 (six)  hours. 09/07/18  Yes Meccariello, Solmon IceBailey J, DO  albuterol (PROVENTIL HFA;VENTOLIN HFA) 108 (90 Base) MCG/ACT inhaler Inhale 2 puffs into the lungs every 6 (six) hours as needed for wheezing or shortness of breath.   Yes [provider]  albuterol (PROVENTIL) (2.5 MG/3ML) 0.083% nebulizer solution Take 3 mLs (2.5 mg total) by nebulization every 4 (four) hours as needed for wheezing or shortness of breath. 11/02/18  Yes [name], Marie S, MD  aspirin EC 81 MG EC tablet Take 1 tablet (81 mg total) by mouth daily. 09/08/18  Yes Meccariello, Solmon IceBailey J, DO  atorvastatin (LIPITOR) 80 MG tablet Take 1 tablet (80 mg total) by mouth daily at 6 PM. 04/06/18  Yes Nettey, Drema PryShayla D, MD  busPIRone (BUSPAR) 5 MG tablet Take 10 mg by mouth 3 (three) times daily.   Yes [provider]    calcium-vitamin D (OSCAL WITH D) 500-200 MG-UNIT tablet Take 1 tablet by mouth daily with breakfast. 09/08/18  Yes Meccariello, Solmon IceBailey J, DO  carvedilol (COREG) 3.125 MG tablet Take 1 tablet (3.125 mg total) by mouth 2 (two) times daily with a meal. 11/02/18  Yes [name], Marie S, MD  clopidogrel (PLAVIX) 75 MG tablet Take 1 tablet (75 mg total) by mouth daily. 11/03/18  Yes [name], Marie S, MD  DULoxetine (CYMBALTA) 30 MG capsule Take 30 mg by mouth every morning.   Yes [provider]  feeding supplement (BOOST HIGH PROTEIN) LIQD Take 1 Container by mouth daily. After breakfast   Yes [provider]  guaiFENesin (ROBITUSSIN) 100 MG/5ML SOLN Take 5 mLs (100 mg total) by mouth every 4 (four) hours as needed for cough or to loosen phlegm. 11/02/18  Yes [name], Marie S, MD  isosorbide mononitrate (IMDUR) 30 MG 24 hr tablet Take 1 tablet (30 mg total) by mouth daily. 09/08/18  Yes Meccariello, Solmon IceBailey J, DO  Melatonin 3 MG TABS Take 3 mg by mouth at bedtime.   Yes [provider]  mirtazapine (REMERON) 7.5 MG tablet Take 1 tablet (7.5 mg total) by mouth at bedtime. 09/07/18  Yes Meccariello, Solmon IceBailey J, DO  nitroGLYCERIN (NITROSTAT) 0.4 MG SL tablet Place 1 tablet (0.4 mg total) under the tongue every 5 (five) minutes as needed for chest pain. 09/07/18  Yes Meccariello, Solmon IceBailey J, DO  ondansetron (ZOFRAN) 4 MG tablet Take 4 mg by mouth every 6 (six) hours as needed for nausea or vomiting.   Yes [provider]  oxybutynin (DITROPAN) 5 MG tablet Take 5 mg by mouth 2 (two) times daily.   Yes [provider]  oxyCODONE (OXY IR/ROXICODONE) 5 MG immediate release tablet Take 1 tablet (5 mg total) by mouth every 6 (six) hours as needed (pain). Patient taking differently: Take 5 mg by mouth every 6 (six) hours as needed for moderate pain (pain).  11/02/18 12/02/18 Yes [name], Marie S, MD  pantoprazole (PROTONIX) 20 MG tablet Take 1 tablet (20 mg total) by mouth daily. 09/07/18   Yes Meccariello, Solmon IceBailey J, DO  polyethylene glycol (MIRALAX / GLYCOLAX) packet Take 17 g by mouth daily as needed for mild constipation. 09/07/18  Yes Meccariello, Solmon IceBailey J, DO  ranolazine (RANEXA) 1000 MG SR tablet Take 1 tablet (1,000 mg total) by mouth 2 (two) times daily. 11/16/18  Yes Azalee CourseMeng, Hao, PA  senna-docusate (SENOKOT-S) 8.6-50 MG tablet Take 2 tablets by mouth at bedtime as needed for mild constipation. 11/02/18  Yes [name], Marie S, MD  tiotropium (SPIRIVA) 18 MCG inhalation capsule Place 18 mcg into  inhaler and inhale 2 (two) times daily.   Yes [provider]  gabapentin (NEURONTIN) 300 MG capsule Take 1 capsule (300 mg total) by mouth 3 (three) times daily for 7 days. Patient not taking: Reported on 11/22/2018 03/23/18 11/22/18  Cristina Gong, PA-C  Misc. Devices (BATH/SHOWER SEAT) MISC Shower seat Patient not taking: Reported on 10/28/2018 04/06/18   Laverna Peace, MD    Family History Family History  Problem Relation Age of Onset  . Hypertension Mother   . Heart disease Father        details unclear    Social History Social History   Tobacco Use  . Smoking status: Current Every Day Smoker    Packs/day: 0.50    Years: 10.00    Pack years: 5.00    Types: Cigarettes  . Smokeless tobacco: Never Used  . Tobacco comment: Pt reports she recently quit after 45 years but family says she still smokes  Substance Use Topics  . Alcohol use: Not Currently  . Drug use: Not Currently     Allergies   Penicillins   Review of Systems Review of Systems All other systems reviewed and are negative except that which was mentioned in HPI   Physical Exam Updated Vital Signs BP (!) 147/67   Pulse 63   Temp 98 F (36.7 C) (Oral)   Resp 16   Ht 5\' 2"  (1.575 m)   Wt 44.9 kg   SpO2 96%   BMI 18.11 kg/m   Physical Exam Vitals signs and nursing note reviewed.  Constitutional:      General: She is not in acute distress.    Appearance: She is well-developed.      Comments: Thin, frail, chronically ill appearing but comfortable   HENT:     Head: Normocephalic and atraumatic.  Eyes:     Extraocular Movements: Extraocular movements intact.     Conjunctiva/sclera: Conjunctivae normal.     Comments: B/l blindness with cloudy R pupil  Neck:     Musculoskeletal: Neck supple.  Cardiovascular:     Rate and Rhythm: Normal rate and regular rhythm.     Heart sounds: Normal heart sounds. No murmur.  Pulmonary:     Effort: Pulmonary effort is normal.     Breath sounds: Normal breath sounds.  Abdominal:     General: Bowel sounds are normal. There is no distension.     Palpations: Abdomen is soft.     Tenderness: There is no abdominal tenderness.  Skin:    General: Skin is warm and dry.  Neurological:     Mental Status: She is alert and oriented to person, place, and time.     Comments: Mild dysarthria but fluent speech with no aphasia; R arm contracture with increased muscle tone, 4/5 R grip strength, 5/5 L grip strength, 4/5 strength RLE, 5/5 strength LLE Occasional twitch of face but no rhythmic movements  Psychiatric:        Judgment: Judgment normal.      ED Treatments / Results  Labs (all labs ordered are listed, but only abnormal results are displayed) Labs Reviewed  COMPREHENSIVE METABOLIC PANEL - Abnormal; Notable for the following components:      Result Value   CO2 21 (*)    Creatinine, Ser 1.06 (*)    GFR calc non Af Amer 55 (*)    All other components within normal limits  CBC WITH DIFFERENTIAL/PLATELET - Abnormal; Notable for the following components:   RBC 3.86 (*)  MCV 100.3 (*)    Monocytes Absolute 1.1 (*)    All other components within normal limits  URINALYSIS, ROUTINE W REFLEX MICROSCOPIC - Abnormal; Notable for the following components:   Bacteria, UA RARE (*)    All other components within normal limits  URINE CULTURE    EKG EKG Interpretation  Date/Time:  Tuesday November 22 2018 13:35:22 EST Ventricular  Rate:  65 PR Interval:    QRS Duration: 96 QT Interval:  469 QTC Calculation: 488 R Axis:   71 Text Interpretation:  AV block, complete (third degree) Borderline T abnormalities, inferior leads Minimal ST elevation, anterior leads Borderline prolonged QT interval Previous T wave inversions in precordial leads have normalized Confirmed by Frederick Peers [phone]) on 11/22/2018 2:16:38 PM   Radiology Ct Head Wo Contrast  Result Date: 11/22/2018 CLINICAL DATA:  Fall 2 days ago. Headache for 2 days since fall. History of CVA 5 years ago. Patient is blind and takes Plavix. EXAM: CT HEAD WITHOUT CONTRAST TECHNIQUE: Contiguous axial images were obtained from the base of the skull through the vertex without intravenous contrast. COMPARISON:  CT scans March 23, 2018 and Apr 22, 2018. FINDINGS: Brain: No subdural, epidural, or subarachnoid hemorrhage. Bilateral infarcts in the bilateral frontal parietal regions, left greater than right and stable. Cerebellum, brainstem, and basal cisterns are normal. Ventricles and sulci are unchanged unremarkable. No mass effect or midline shift. No acute cortical ischemia or infarct. Vascular: No hyperdense vessel or unexpected calcification. Skull: Normal. Negative for fracture or focal lesion. Sinuses/Orbits: No acute finding. Other: None. IMPRESSION: No acute intracranial abnormalities.  Chronic infarcts as above. Electronically Signed   By: Gerome Sam III M.D   On: 11/22/2018 15:05    Procedures Procedures (including critical care time)  Medications Ordered in ED Medications  acetaminophen (TYLENOL) tablet 1,000 mg (1,000 mg Oral Given 11/22/18 1630)     Initial Impression / Assessment and Plan / ED Course  I have reviewed the triage vital signs and the nursing notes.  Pertinent labs & imaging results that were available during my care of the patient were reviewed by me and considered in my medical decision making (see chart for details).     PT  comfortable on exam, stable VS. she had a right arm contracture but no flaccid weakness and no aphasia on exam.  Occasional twitching and tremoring but nothing rhythmic to suggest seizure and patient alert and oriented.  Was concerned about her symptoms given her recent head injury.  Obtain head CT to rule out delayed bleed.  Head CT is negative for acute process.  WBC reassuring, UA negative for infection.  The patient is currently on maximal medical therapy to prevent stroke including statin, antihypertensives, aspirin, and Plavix. I do not feel her current exam suggests acute stroke/TIA and I also feel seizure is unlikely.  Recommended that she follow-up with her neurologist closely in the clinic and I have extensively reviewed return precautions with her.  She voiced understanding.  Final Clinical Impressions(s) / ED Diagnoses   Final diagnoses:  Occasional tremors  Nonintractable headache, unspecified chronicity pattern, unspecified headache type    ED Discharge Orders         Ordered    Ambulatory referral to Neurology    Comments:  An appointment is requested in approximately: 2 weeks   11/22/18 1628           Draxton Luu, Ambrose Finland, MD 11/23/18 [phone]

## 2018-11-22 NOTE — Discharge Instructions (Addendum)
You should contact [facility] for stroke doctor follow up. REturn to ER if any new weakness, confusion, or vomiting.

## 2018-11-22 NOTE — ED Notes (Signed)
Patient transported to CT 

## 2018-11-22 NOTE — ED Triage Notes (Signed)
Pt arrives via [facility] with reports of a fall 2 days ago where she struck the back of her head. Facility reports a in house scan was done and came back negative. Pt has had a HA for 2 days since the fall. Hx of a CVA from 5 years ago with right side deficits. Staff reports slurred speech and tremors that started around 9 and pt reports worsened right hand contracture. Pt is blind, takes plavix.

## 2018-11-23 LAB — URINE CULTURE

## 2018-12-28 ENCOUNTER — Ambulatory Visit (INDEPENDENT_AMBULATORY_CARE_PROVIDER_SITE_OTHER): Payer: Medicare Other | Admitting: Internal Medicine

## 2018-12-28 ENCOUNTER — Encounter: Payer: Self-pay | Admitting: Internal Medicine

## 2018-12-28 VITALS — BP 135/71 | HR 62 | Ht 62.0 in | Wt 107.2 lb

## 2018-12-28 DIAGNOSIS — I739 Peripheral vascular disease, unspecified: Secondary | ICD-10-CM | POA: Diagnosis not present

## 2018-12-28 DIAGNOSIS — I25118 Atherosclerotic heart disease of native coronary artery with other forms of angina pectoris: Secondary | ICD-10-CM

## 2018-12-28 DIAGNOSIS — H543 Unqualified visual loss, both eyes: Secondary | ICD-10-CM | POA: Diagnosis not present

## 2018-12-28 NOTE — Progress Notes (Signed)
OFFICE NOTE  Chief Complaint:  Routine follow-up  Primary Care Physician: Durenda Hurt, Flossie Buffy, MD  HPI:  Mary Nguyen is a 68 y.o. female with a past medial history significant for CAD/PVD/bradycardia, multiple strokes, blindness, cataracts, untreated HLD, heart murmur, skipped heart beats,who has had multiple admissions for chest pain.  She is also had multiple attempts at cardiac catheterization including 1 or 2 attempts at [facility] as well as here with inability to access her coronaries because of significant peripheral arterial disease.  I last saw her in April 2019 with similar anginal symptoms.  She again presented in November 2019 with chest pain concerning for angina but because of her lack of accessibility to the coronaries, medical therapy was recommended.  Since that time she has not had any significant recurrent angina.  She did follow-up with Mary Nguyen in December 2019.  At the time he increased her ranolazine further to 1000 mg twice daily.  She is also on isosorbide 30 mg daily, aspirin 81 mg daily, atorvastatin, carvedilol, Plavix and low-dose Lasix.  She is a resident of long-term care facility.  During her last hospitalization there was discussion about palliative care involvement and not to elect any invasive procedures.  PMHx:  Past Medical History:  Diagnosis Date  . Blindness   . Bradycardia    a. pt reports hx of slow HR.  Marland Kitchen CAD in native artery    a. pt reports "blockage in the back of her heart" sometime in 2018 by cath at [facility].  . Carotid artery disease (HCC)    a. pt reports "blockage scraped" R neck artery around 2000.  . Cataract   . Former tobacco use   . Heart murmur   . Hyperlipidemia   . PAD (peripheral artery disease) (HCC)    a. she was told she had poor circulation from the waist down.  . Skipped heart beats   . Stroke [facility])    a. multiple strokes - 4 total, first one in her R eye in 2002, most recent one just a  few months ago (as of 03/2018).    Past Surgical History:  Procedure Laterality Date  . LEFT HEART CATH AND CORONARY ANGIOGRAPHY N/A 04/04/2018   Procedure: LEFT HEART CATH AND CORONARY ANGIOGRAPHY;  Surgeon: Mary Records, MD;  [facility] INV[facility] CV LAB;  Service: Cardiovascular;  Laterality: N/A;  . TUBAL LIGATION      FAMHx:  Family History  Problem Relation Age of Onset  . Hypertension Mother   . Heart disease Father        details unclear    SOCHx:   reports that she has been smoking cigarettes. She has a 5.00 pack-year smoking history. She has never used smokeless tobacco. She reports previous alcohol use. She reports previous drug use.  ALLERGIES:  Allergies  Allergen Reactions  . Penicillins Anaphylaxis    Has patient had a PCN reaction causing immediate rash, facial/tongue/throat swelling, SOB or lightheadedness with hypotension: Yes Has patient had a PCN reaction causing severe rash involving mucus membranes or skin necrosis: No Has patient had a PCN reaction that required hospitalization: Yes Has patient had a PCN reaction occurring within the last 10 years: No If all of the above answers are "NO", then may proceed with Cephalosporin use.     ROS: Pertinent items noted in HPI and remainder of comprehensive ROS otherwise negative.  HOME MEDS: Current Outpatient Medications on File Prior to Visit  Medication Sig Dispense  Refill  . acetaminophen (TYLENOL) 325 MG tablet Take 2 tablets (650 mg total) by mouth every 6 (six) hours.    Marland Kitchen. albuterol (PROVENTIL HFA;VENTOLIN HFA) 108 (90 Base) MCG/ACT inhaler Inhale 2 puffs into the lungs every 6 (six) hours as needed for wheezing or shortness of breath.    Marland Kitchen. albuterol (PROVENTIL) (2.5 MG/3ML) 0.083% nebulizer solution Take 3 mLs (2.5 mg total) by nebulization every 4 (four) hours as needed for wheezing or shortness of breath. 75 mL 12  . aspirin EC 81 MG EC tablet Take 1 tablet (81 mg total) by mouth daily. 30 tablet 0  .  atorvastatin (LIPITOR) 80 MG tablet Take 1 tablet (80 mg total) by mouth daily at 6 PM. 30 tablet 0  . busPIRone (BUSPAR) 5 MG tablet Take 10 mg by mouth 3 (three) times daily.    . calcium-vitamin D (OSCAL WITH D) 500-200 MG-UNIT tablet Take 1 tablet by mouth daily with breakfast. 30 tablet 0  . carvedilol (COREG) 3.125 MG tablet Take 1 tablet (3.125 mg total) by mouth 2 (two) times daily with a meal.    . clopidogrel (PLAVIX) 75 MG tablet Take 1 tablet (75 mg total) by mouth daily.    . DULoxetine (CYMBALTA) 30 MG capsule Take 30 mg by mouth every morning.    . feeding supplement (BOOST HIGH PROTEIN) LIQD Take 1 Container by mouth daily. After breakfast    . furosemide (LASIX) 20 MG tablet Take 20 mg by mouth daily. For 2 weeks then stop    . gabapentin (NEURONTIN) 300 MG capsule Take 1 capsule (300 mg total) by mouth 3 (three) times daily for 7 days. 21 capsule 0  . guaiFENesin (ROBITUSSIN) 100 MG/5ML SOLN Take 5 mLs (100 mg total) by mouth every 4 (four) hours as needed for cough or to loosen phlegm. 1200 mL 0  . isosorbide mononitrate (IMDUR) 30 MG 24 hr tablet Take 1 tablet (30 mg total) by mouth daily. 30 tablet 0  . Melatonin 3 MG TABS Take 3 mg by mouth at bedtime.    . mirtazapine (REMERON) 7.5 MG tablet Take 1 tablet (7.5 mg total) by mouth at bedtime. 30 tablet 0  . Misc. Devices (BATH/SHOWER SEAT) MISC Shower seat 1 each 0  . nitroGLYCERIN (NITROSTAT) 0.4 MG SL tablet Place 1 tablet (0.4 mg total) under the tongue every 5 (five) minutes as needed for chest pain. 30 tablet 0  . ondansetron (ZOFRAN) 4 MG tablet Take 4 mg by mouth every 6 (six) hours as needed for nausea or vomiting.    Marland Kitchen. oxybutynin (DITROPAN) 5 MG tablet Take 5 mg by mouth 2 (two) times daily.    . pantoprazole (PROTONIX) 20 MG tablet Take 1 tablet (20 mg total) by mouth daily. 30 tablet 0  . polyethylene glycol (MIRALAX / GLYCOLAX) packet Take 17 g by mouth daily as needed for mild constipation. 14 each 0  . ranolazine  (RANEXA) 1000 MG SR tablet Take 1 tablet (1,000 mg total) by mouth 2 (two) times daily. 360 tablet 0  . senna-docusate (SENOKOT-S) 8.6-50 MG tablet Take 2 tablets by mouth at bedtime as needed for mild constipation. 30 tablet   . tiotropium (SPIRIVA) 18 MCG inhalation capsule Place 18 mcg into inhaler and inhale 2 (two) times daily.     No current facility-administered medications on file prior to visit.     LABS/IMAGING: No results found for this or any previous visit (from the past 48 hour(s)). No results found.  LIPID PANEL:    Component Value Date/Time   CHOL 227 (H) 04/02/2018 0206   TRIG 65 04/02/2018 0206   HDL 41 04/02/2018 0206   CHOLHDL 5.5 04/02/2018 0206   VLDL 13 04/02/2018 0206   LDLCALC 173 (H) 04/02/2018 0206     WEIGHTS: Wt Readings from Last 3 Encounters:  12/28/18 107 lb 3.2 oz (48.6 kg)  11/22/18 99 lb (44.9 kg)  11/16/18 99 lb (44.9 kg)    VITALS: BP 135/71   Pulse 62   Ht 5\' 2"  (1.575 m)   Wt 107 lb 3.2 oz (48.6 kg)   BMI 19.61 kg/m   EXAM: General appearance: alert, appears older than stated age and no distress Neck: no carotid bruit, no JVD and thyroid not enlarged, symmetric, no tenderness/mass/nodules Lungs: clear to auscultation bilaterally Heart: regular rate and rhythm, S1, S2 normal and systolic murmur: systolic ejection 3/6, blowing at apex Abdomen: soft, non-tender; bowel sounds normal; no masses,  no organomegaly Extremities: extremities normal, atraumatic, no cyanosis or edema Pulses: 2+ and symmetric Skin: Pale, cool, dry Neurologic: Grossly normal Psych: Pleasant  EKG: Deferred   ASSESSMENT: 1. CAD-medically managed due to lack of peripheral access 2. PAD with multiple peripheral occlusions 3. History of multiple stroke 4. Blindness/cataracts 5. Dyslipidemia 6. Murmur  PLAN: 1.   Unfortunately Ms.Culpepper is not a candidate for invasive procedures due to lack of vascular access.  She has had multiple catheterization  attempts the last of which in April 2019 was unsuccessful.  We will continue to manage her medically.  Her antianginals of recently been uptitrated she seems to be chest pain-free at the moment.  I do agree with palliative care involvement for pain management and we could consider alternative such as opioids if necessary for worsening chest pain in the future.  Follow-up with Mary BirchHao in 6 months and me annually.  Mary NoseKenneth C. Milo Schreier, MD, Highlands Regional Medical CenterFACC, FACP  [place]  [facility]  Medical Director of the Advanced Lipid Disorders &  Cardiovascular Risk Reduction Clinic Diplomate of the American Board of Clinical Lipidology Attending Cardiologist  Direct Dial: [phone]  Fax: [phone]  Website:  [email or web address]   Mary Nguyen 12/28/2018, 12:52 PM

## 2018-12-28 NOTE — Patient Instructions (Signed)
Medication Instructions:  Continue current medications If you need a refill on your cardiac medications before your next appointment, please call your pharmacy.    Follow-Up: At [facility], you and your health needs are our priority.  As part of our continuing mission to provide you with exceptional heart care, we have created designated Provider Care Teams.  These Care Teams include your primary Cardiologist (physician) and Advanced Practice Providers (APPs -  Physician Assistants and Nurse Practitioners) who all work together to provide you with the care you need, when you need it. . You will need a follow up appointment in 6 months with Azalee Course PA and 12 months with Dr. Rennis Golden  Any Other Special Instructions Will Be Listed Below (If Applicable).

## 2019-01-05 ENCOUNTER — Emergency Department (HOSPITAL_COMMUNITY): Payer: Medicare Other

## 2019-01-05 ENCOUNTER — Encounter (HOSPITAL_COMMUNITY): Payer: Self-pay

## 2019-01-05 ENCOUNTER — Other Ambulatory Visit: Payer: Self-pay

## 2019-01-05 ENCOUNTER — Emergency Department (HOSPITAL_COMMUNITY)
Admission: EM | Admit: 2019-01-05 | Discharge: 2019-01-06 | Disposition: A | Discharge status: Home / Self Care | Payer: Medicare Other | Attending: Emergency Medicine | Admitting: Emergency Medicine

## 2019-01-05 DIAGNOSIS — Z79899 Other long term (current) drug therapy: Secondary | ICD-10-CM | POA: Diagnosis not present

## 2019-01-05 DIAGNOSIS — M25512 Pain in left shoulder: Secondary | ICD-10-CM | POA: Insufficient documentation

## 2019-01-05 DIAGNOSIS — W1839XA Other fall on same level, initial encounter: Secondary | ICD-10-CM | POA: Diagnosis not present

## 2019-01-05 DIAGNOSIS — I252 Old myocardial infarction: Secondary | ICD-10-CM | POA: Diagnosis not present

## 2019-01-05 DIAGNOSIS — I2510 Atherosclerotic heart disease of native coronary artery without angina pectoris: Secondary | ICD-10-CM | POA: Insufficient documentation

## 2019-01-05 DIAGNOSIS — Z7982 Long term (current) use of aspirin: Secondary | ICD-10-CM | POA: Diagnosis not present

## 2019-01-05 DIAGNOSIS — Y92121 Bathroom in nursing home as the place of occurrence of the external cause: Secondary | ICD-10-CM | POA: Insufficient documentation

## 2019-01-05 DIAGNOSIS — I69353 Hemiplegia and hemiparesis following cerebral infarction affecting right non-dominant side: Secondary | ICD-10-CM | POA: Diagnosis not present

## 2019-01-05 DIAGNOSIS — Y93E8 Activity, other personal hygiene: Secondary | ICD-10-CM | POA: Insufficient documentation

## 2019-01-05 DIAGNOSIS — Y999 Unspecified external cause status: Secondary | ICD-10-CM | POA: Insufficient documentation

## 2019-01-05 DIAGNOSIS — Z7901 Long term (current) use of anticoagulants: Secondary | ICD-10-CM | POA: Insufficient documentation

## 2019-01-05 DIAGNOSIS — R531 Weakness: Secondary | ICD-10-CM | POA: Insufficient documentation

## 2019-01-05 DIAGNOSIS — W19XXXA Unspecified fall, initial encounter: Secondary | ICD-10-CM

## 2019-01-05 LAB — CBC WITH DIFFERENTIAL/PLATELET
Abs Immature Granulocytes: 0.04 10*3/uL (ref 0.00–0.07)
Basophils Absolute: 0 10*3/uL (ref 0.0–0.1)
Basophils Relative: 1 %
Eosinophils Absolute: 0.4 10*3/uL (ref 0.0–0.5)
Eosinophils Relative: 5 %
HCT: 35.8 % — ABNORMAL LOW (ref 36.0–46.0)
Hemoglobin: 11 g/dL — ABNORMAL LOW (ref 12.0–15.0)
Immature Granulocytes: 1 %
Lymphocytes Relative: 25 %
Lymphs Abs: 2 10*3/uL (ref 0.7–4.0)
MCH: 29.8 pg (ref 26.0–34.0)
MCHC: 30.7 g/dL (ref 30.0–36.0)
MCV: 97 fL (ref 80.0–100.0)
MONO ABS: 1 10*3/uL (ref 0.1–1.0)
Monocytes Relative: 13 %
Neutro Abs: 4.4 10*3/uL (ref 1.7–7.7)
Neutrophils Relative %: 55 %
Platelets: 344 10*3/uL (ref 150–400)
RBC: 3.69 MIL/uL — ABNORMAL LOW (ref 3.87–5.11)
RDW: 14.4 % (ref 11.5–15.5)
WBC: 7.9 10*3/uL (ref 4.0–10.5)
nRBC: 0 % (ref 0.0–0.2)

## 2019-01-05 NOTE — ED Triage Notes (Addendum)
Pt BIB [facility] for eval of a fall coming from [place]. EMS reports pt has felt increasingly weak lately and today stood up, felt weak and fell. Pt is on plavix. Pt c/o L shoulder pain, L flank pain and L sided discomfort. Hx of multiple strokes, no new neuro deficits. No obvious s/sx of trauma on arrival

## 2019-01-05 NOTE — ED Provider Notes (Signed)
[facility] EMERGENCY DEPARTMENT Provider Note   CSN: [id number] Arrival date & time: 01/05/19  2214     History   Chief Complaint Chief Complaint  Patient presents with  . Fall    HPI Mary Nguyen is a 68 y.o. female.  Patient with PMH of prior strokes with residual R arm weakness, asymmetric pupils, hx of CAD, PAD, and bradycardia presents to the emergency department with a chief complaint of fall. States that she had been feeling weak lately. She is from [place].  She stood up after using bedside commode and fell over backwards.  She does not believe that she passed out.  The fall was unwitnessed.  She complains of left shoulder pain and left hip pain.  She is anticoagulated on plavix.  She denies any other injuries.  Denies any treatments prior to arrival.    The history is provided by the patient. No language interpreter was used.    Past Medical History:  Diagnosis Date  . Blindness   . Bradycardia    a. pt reports hx of slow HR.  Marland Kitchen CAD in native artery    a. pt reports "blockage in the back of her heart" sometime in 2018 by cath at [facility].  . Carotid artery disease (HCC)    a. pt reports "blockage scraped" R neck artery around 2000.  . Cataract   . Former tobacco use   . Heart murmur   . Hyperlipidemia   . PAD (peripheral artery disease) (HCC)    a. she was told she had poor circulation from the waist down.  . Skipped heart beats   . Stroke [facility])    a. 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    Patient Active Problem List   Diagnosis Date Noted  . Constipation   . Upper abdominal pain   . Advanced care planning/counseling discussion   . Acute on chronic combined systolic and diastolic heart failure (HCC) 10/29/2018  . Chest pain 10/28/2018  . Non-ST elevation MI (NSTEMI) (HCC)   . Peripheral vascular occlusive disease (HCC)   . PVD  (peripheral vascular disease) (HCC)   . Failure to thrive in adult   . Palliative care by specialist   . Goals of care, counseling/discussion   . Depression   . CAP (community acquired pneumonia) 08/31/2018  . Hypokalemia   . Multifocal pneumonia   . High risk social situation   . Blind   . Tobacco abuse   . Osteoporosis with current pathological fracture   . Protein-calorie malnutrition (HCC)   . Angina at rest [facility]) 04/04/2018  . Precordial pain   . History of stroke 04/02/2018  . Hyperlipidemia 04/02/2018  . Unstable angina (HCC) 04/01/2018    Past Surgical History:  Procedure Laterality Date  . LEFT HEART CATH AND CORONARY ANGIOGRAPHY N/A 04/04/2018   Procedure: LEFT HEART CATH AND CORONARY ANGIOGRAPHY;  Surgeon: Lyn Records, MD;  Location: [facility] INVASIVE CV LAB;  Service: Cardiovascular;  Laterality: N/A;  . TUBAL LIGATION       OB History   No obstetric history on file.      Home Medications    Prior to Admission medications   Medication Sig Start Date End Date Taking? Authorizing Provider  acetaminophen (TYLENOL) 325 MG tablet Take 2 tablets (650 mg total) by mouth every 6 (six) hours. 09/07/18  Meccariello, Solmon IceBailey J, DO  albuterol (PROVENTIL HFA;VENTOLIN HFA) 108 (90 Base) MCG/ACT inhaler Inhale 2 puffs into the lungs every 6 (six) hours as needed for wheezing or shortness of breath.    [provider]  albuterol (PROVENTIL) (2.5 MG/3ML) 0.083% nebulizer solution Take 3 mLs (2.5 mg total) by nebulization every 4 (four) hours as needed for wheezing or shortness of breath. 11/02/18   [name], Marie S, MD  aspirin EC 81 MG EC tablet Take 1 tablet (81 mg total) by mouth daily. 09/08/18   Meccariello, Solmon IceBailey J, DO  atorvastatin (LIPITOR) 80 MG tablet Take 1 tablet (80 mg total) by mouth daily at 6 PM. 04/06/18   Roberto ScalesNettey, Shayla D, MD  busPIRone (BUSPAR) 5 MG tablet Take 10 mg by mouth 3 (three) times daily.    [provider]  calcium-vitamin D (OSCAL WITH D)  500-200 MG-UNIT tablet Take 1 tablet by mouth daily with breakfast. 09/08/18   Meccariello, Solmon IceBailey J, DO  carvedilol (COREG) 3.125 MG tablet Take 1 tablet (3.125 mg total) by mouth 2 (two) times daily with a meal. 11/02/18   [name], Marie S, MD  clopidogrel (PLAVIX) 75 MG tablet Take 1 tablet (75 mg total) by mouth daily. 11/03/18   [name], Marie S, MD  DULoxetine (CYMBALTA) 30 MG capsule Take 30 mg by mouth every morning.    [provider]  feeding supplement (BOOST HIGH PROTEIN) LIQD Take 1 Container by mouth daily. After breakfast    [provider]  furosemide (LASIX) 20 MG tablet Take 20 mg by mouth daily. For 2 weeks then stop    [provider]  gabapentin (NEURONTIN) 300 MG capsule Take 1 capsule (300 mg total) by mouth 3 (three) times daily for 7 days. 03/23/18 12/28/18  Cristina GongHammond, Elizabeth W, PA-C  guaiFENesin (ROBITUSSIN) 100 MG/5ML SOLN Take 5 mLs (100 mg total) by mouth every 4 (four) hours as needed for cough or to loosen phlegm. 11/02/18   [name], Marie S, MD  isosorbide mononitrate (IMDUR) 30 MG 24 hr tablet Take 1 tablet (30 mg total) by mouth daily. 09/08/18   Meccariello, Solmon IceBailey J, DO  Melatonin 3 MG TABS Take 3 mg by mouth at bedtime.    [provider]  mirtazapine (REMERON) 7.5 MG tablet Take 1 tablet (7.5 mg total) by mouth at bedtime. 09/07/18   Meccariello, Solmon IceBailey J, DO  Misc. Devices (BATH/SHOWER SEAT) MISC Shower seat 04/06/18   Roberto ScalesNettey, Shayla D, MD  nitroGLYCERIN (NITROSTAT) 0.4 MG SL tablet Place 1 tablet (0.4 mg total) under the tongue every 5 (five) minutes as needed for chest pain. 09/07/18   Meccariello, Solmon IceBailey J, DO  ondansetron (ZOFRAN) 4 MG tablet Take 4 mg by mouth every 6 (six) hours as needed for nausea or vomiting.    [provider]  oxybutynin (DITROPAN) 5 MG tablet Take 5 mg by mouth 2 (two) times daily.    [provider]  pantoprazole (PROTONIX) 20 MG tablet Take 1 tablet (20 mg total) by mouth daily. 09/07/18    Meccariello, Solmon IceBailey J, DO  polyethylene glycol (MIRALAX / GLYCOLAX) packet Take 17 g by mouth daily as needed for mild constipation. 09/07/18   Meccariello, Solmon IceBailey J, DO  ranolazine (RANEXA) 1000 MG SR tablet Take 1 tablet (1,000 mg total) by mouth 2 (two) times daily. 11/16/18   Azalee CourseMeng, Hao, PA  senna-docusate (SENOKOT-S) 8.6-50 MG tablet Take 2 tablets by mouth at bedtime as needed for mild constipation. 11/02/18   [name], Marie S, MD  tiotropium (SPIRIVA) 18 MCG inhalation capsule Place 18 mcg into inhaler and inhale 2 (two) times daily.    [provider]    Family History Family History  Problem Relation Age of Onset  . Hypertension Mother   . Heart disease Father        details unclear    Social History Social History   Tobacco Use  . Smoking status: Current Every Day Smoker    Packs/day: 0.50    Years: 10.00    Pack years: 5.00    Types: Cigarettes  . Smokeless tobacco: Never Used  . Tobacco comment: Pt reports she recently quit after 45 years but family says she still smokes  Substance Use Topics  . Alcohol use: Not Currently  . Drug use: Not Currently     Allergies   Penicillins   Review of Systems Review of Systems  All other systems reviewed and are negative.    Physical Exam Updated Vital Signs There were no vitals taken for this visit.  Physical Exam Vitals signs and nursing note reviewed.  Constitutional:      Appearance: She is well-developed.  HENT:     Head: Normocephalic and atraumatic.  Eyes:     Conjunctiva/sclera: Conjunctivae normal.     Comments: Asymmetric pupils (baseline)  Neck:     Musculoskeletal: Normal range of motion and neck supple.  Cardiovascular:     Rate and Rhythm: Normal rate and regular rhythm.     Heart sounds: No murmur. No friction rub. No gallop.   Pulmonary:     Effort: Pulmonary effort is normal. No respiratory distress.     Breath sounds: Normal breath sounds. No wheezing or rales.  Chest:     Chest  wall: No tenderness.  Abdominal:     General: Bowel sounds are normal. There is no distension.     Palpations: Abdomen is soft. There is no mass.     Tenderness: There is no abdominal tenderness. There is no guarding or rebound.  Musculoskeletal: Normal range of motion.        General: No tenderness.     Comments: RUE weakness from prior stroke, otherwise normal ROM and strength exam  Skin:    General: Skin is warm and dry.  Neurological:     Mental Status: She is alert and oriented to person, place, and time.  Psychiatric:        Behavior: Behavior normal.        Thought Content: Thought content normal.        Judgment: Judgment normal.      ED Treatments / Results  Labs (all labs ordered are listed, but only abnormal results are displayed) Labs Reviewed  CBC WITH DIFFERENTIAL/PLATELET - Abnormal; Notable for the following components:      Result Value   RBC 3.69 (*)    Hemoglobin 11.0 (*)    HCT 35.8 (*)    All other components within normal limits  COMPREHENSIVE METABOLIC PANEL - Abnormal; Notable for the following components:   Chloride 96 (*)    Albumin 3.4 (*)    All other components within normal limits  URINALYSIS, ROUTINE W REFLEX MICROSCOPIC - Abnormal; Notable for the following components:   Hgb urine dipstick SMALL (*)    Protein, ur 30 (*)    Bacteria, UA RARE (*)    All other components within normal limits    EKG EKG Interpretation  Date/Time:  Thursday January 05 2019 23:33:04 EST Ventricular Rate:  64 PR Interval:    QRS Duration: 94 QT Interval:  472 QTC Calculation: 487 R Axis:   75 Text Interpretation:  Sinus rhythm Minimal ST depression, inferior leads Borderline prolonged QT interval No acute changes No significant change since last tracing Confirmed by Derwood Kaplan [phone]) on 01/05/2019 11:50:01 PM   Radiology Ct Head Wo Contrast  Result Date: 01/05/2019 CLINICAL DATA:  Weakness and fall. Patient on Plavix. EXAM: CT HEAD WITHOUT  CONTRAST CT CERVICAL SPINE WITHOUT CONTRAST TECHNIQUE: Multidetector CT imaging of the head and cervical spine was performed following the standard protocol without intravenous contrast. Multiplanar CT image reconstructions of the cervical spine were also generated. COMPARISON:  CT head 11/22/2018. CT head and cervical spine 04/22/2018. FINDINGS: CT HEAD FINDINGS Brain: Diffuse cerebral atrophy. Low-attenuation changes in the deep white matter consistent with central atrophy. Areas of encephalomalacia in the frontal convexities bilaterally consistent with old infarcts, unchanged. No mass-effect or midline shift. No abnormal extra-axial fluid collections. Gray-white matter junctions are distinct. Basal cisterns are not effaced. No acute intracranial hemorrhage. Vascular: Intracranial arterial vascular calcifications are present. Skull: Calvarium appears intact. Sinuses/Orbits: Paranasal sinuses and mastoid air cells are clear. Other: None. CT CERVICAL SPINE FINDINGS Alignment: Normal alignment of the cervical spine. C1-2 articulation appears intact. Skull base and vertebrae: Skull base appears intact. Compression of the T3 vertebral body without change since prior study. No acute vertebral compression deformities identified. No focal bone lesion or bone destruction. Bone cortex appears intact. Soft tissues and spinal canal: No prevertebral soft tissue swelling. No abnormal paraspinal soft tissue mass or infiltration. Disc levels: Degenerative changes with disc space narrowing and endplate hypertrophic changes most prominent at C4-5, C5-6, and C6-7 levels. Upper chest: Motion artifact limits examination. Suggestion of thickening of interlobular septa possibly representing edema or fibrosis. Vascular calcifications in the cervical carotid arteries. Other: None. IMPRESSION: 1. No acute intracranial abnormalities. Chronic atrophy and small vessel ischemic changes. Old bilateral frontal infarcts. 2. Normal alignment of  the cervical spine. Degenerative changes. No acute displaced fractures identified. Electronically Signed   By: Burman Nieves M.D.   On: 01/05/2019 23:25   Ct Cervical Spine Wo Contrast  Result Date: 01/05/2019 CLINICAL DATA:  Weakness and fall. Patient on Plavix. EXAM: CT HEAD WITHOUT CONTRAST CT CERVICAL SPINE WITHOUT CONTRAST TECHNIQUE: Multidetector CT imaging of the head and cervical spine was performed following the standard protocol without intravenous contrast. Multiplanar CT image reconstructions of the cervical spine were also generated. COMPARISON:  CT head 11/22/2018. CT head and cervical spine 04/22/2018. FINDINGS: CT HEAD FINDINGS Brain: Diffuse cerebral atrophy. Low-attenuation changes in the deep white matter consistent with central atrophy. Areas of encephalomalacia in the frontal convexities bilaterally consistent with old infarcts, unchanged. No mass-effect or midline shift. No abnormal extra-axial fluid collections. Gray-white matter junctions are distinct. Basal cisterns are not effaced. No acute intracranial hemorrhage. Vascular: Intracranial arterial vascular calcifications are present. Skull: Calvarium appears intact. Sinuses/Orbits: Paranasal sinuses and mastoid air cells are clear. Other: None. CT CERVICAL SPINE FINDINGS Alignment: Normal alignment of the cervical spine. C1-2 articulation appears intact. Skull base and vertebrae: Skull base appears intact. Compression of the T3 vertebral body without change since prior study. No acute vertebral compression deformities identified. No focal bone lesion or bone destruction. Bone cortex appears intact. Soft tissues and spinal canal: No prevertebral soft tissue swelling. No abnormal paraspinal soft tissue mass or infiltration. Disc levels: Degenerative changes with disc space narrowing and endplate hypertrophic changes most prominent at C4-5, C5-6, and  C6-7 levels. Upper chest: Motion artifact limits examination. Suggestion of thickening  of interlobular septa possibly representing edema or fibrosis. Vascular calcifications in the cervical carotid arteries. Other: None. IMPRESSION: 1. No acute intracranial abnormalities. Chronic atrophy and small vessel ischemic changes. Old bilateral frontal infarcts. 2. Normal alignment of the cervical spine. Degenerative changes. No acute displaced fractures identified. Electronically Signed   By: Burman NievesWilliam  Stevens M.D.   On: 01/05/2019 23:25   Dg Shoulder Left  Result Date: 01/05/2019 CLINICAL DATA:  Pain after fall EXAM: LEFT SHOULDER - 2+ VIEW COMPARISON:  None. 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 No joint dislocation. The scapula, included ribs and adjacent lung are nonacute. The included aortic arch is atherosclerotic in appearance. Facet arthrosis is noted of the included lower cervical spine. 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 No acute osseous abnormality. Electronically Signed   By: Tollie Ethavid  Kwon M.D.   On: 01/05/2019 23:25   Dg Hip Unilat W Or W/o Pelvis Min 4 Views Left  Result Date: 01/05/2019 CLINICAL DATA:  Left hip pain after fall. EXAM: DG HIP (WITH OR WITHOUT PELVIS) 4+V LEFT COMPARISON:  None. FINDINGS: The included pelvis and both hips are intact without fracture or joint dislocation. Axial joint space narrowing is seen of both hips. No pelvic diastasis. The arcuate lines of the included sacrum appear intact. Phleboliths are present within pelvis bilaterally. Soft tissues are unremarkable. Extensive atherosclerosis of the included common iliac arteries and branch vessels the the proximal femoral arteries. IMPRESSION: No acute osseous abnormality. Electronically Signed   By: Tollie Ethavid  Kwon M.D.   On: 01/05/2019 23:27    Procedures Procedures (including critical care time)  Medications Ordered in ED Medications - No data to display   Initial Impression / Assessment and Plan / ED Course  I have reviewed the  triage vital signs and the nursing notes.  Pertinent labs & imaging results that were available during my care of the patient were reviewed by me and considered in my medical decision making (see chart for details).    Patient with fall at nursing home.  She stood up after using bedside commode, became dizzy and lightheaded and fell to the ground.  She states that she did not pass out.  She complains of mild headache, left shoulder, and left hip pain.  She takes Plavix.  She is in a nursing home due to prior stroke.  Imaging is negative for acute abnormality.  Patient is at her baseline.  Do not feel that any additional work-up is needed on an emergent basis.  We will discharged home with primary care follow-up.  Final Clinical Impressions(s) / ED Diagnoses   Final diagnoses:  Fall, initial encounter    ED Discharge Orders    None       Roxy HorsemanBrowning, Mallary Kreger, PA-C 01/06/19 [id number]    Derwood KaplanNanavati, Ankit, MD 01/08/19 [phone]

## 2019-01-06 LAB — COMPREHENSIVE METABOLIC PANEL
ALT: 25 U/L (ref 0–44)
AST: 27 U/L (ref 15–41)
Albumin: 3.4 g/dL — ABNORMAL LOW (ref 3.5–5.0)
Alkaline Phosphatase: 91 U/L (ref 38–126)
Anion gap: 12 (ref 5–15)
BUN: 17 mg/dL (ref 8–23)
CO2: 28 mmol/L (ref 22–32)
Calcium: 9.2 mg/dL (ref 8.9–10.3)
Chloride: 96 mmol/L — ABNORMAL LOW (ref 98–111)
Creatinine, Ser: 0.96 mg/dL (ref 0.44–1.00)
GFR calc Af Amer: >60 mL/min (ref >60)
GFR calc non Af Amer: >60 mL/min (ref >60)
Glucose, Bld: 86 mg/dL (ref 70–99)
Potassium: 3.6 mmol/L (ref 3.5–5.1)
Sodium: 136 mmol/L (ref 135–145)
Total Bilirubin: 0.4 mg/dL (ref 0.3–1.2)
Total Protein: 7.5 g/dL (ref 6.5–8.1)

## 2019-01-06 LAB — URINALYSIS, ROUTINE W REFLEX MICROSCOPIC
BILIRUBIN URINE: NEGATIVE
Glucose, UA: NEGATIVE mg/dL
KETONES UR: NEGATIVE mg/dL
Leukocytes, UA: NEGATIVE
Nitrite: NEGATIVE
Protein, ur: 30 mg/dL — AB
Specific Gravity, Urine: 1.008 (ref 1.005–1.030)
pH: 7 (ref 5.0–8.0)

## 2019-01-06 NOTE — Discharge Instructions (Addendum)
Please take 30 seconds pause between position changes to ensure that you're stable and not dizzy.  Drink plenty of fluids.  Please follow-up with your doctor.

## 2019-01-06 NOTE — ED Notes (Signed)
[facility] CALLED  °

## 2019-01-06 NOTE — ED Notes (Signed)
Patient verbalizes understanding of discharge instructions. Opportunity for questioning and answers were provided. Armband removed by staff, pt discharged from ED back to her SNF, [facility], via [facility].

## 2019-01-19 ENCOUNTER — Encounter

## 2019-01-19 ENCOUNTER — Telehealth: Payer: Self-pay | Admitting: Neurology

## 2019-01-19 ENCOUNTER — Ambulatory Visit (INDEPENDENT_AMBULATORY_CARE_PROVIDER_SITE_OTHER): Payer: Medicare Other | Admitting: Neurology

## 2019-01-19 ENCOUNTER — Encounter: Payer: Self-pay | Admitting: Neurology

## 2019-01-19 VITALS — BP 98/62 | HR 72

## 2019-01-19 DIAGNOSIS — I639 Cerebral infarction, unspecified: Secondary | ICD-10-CM

## 2019-01-19 DIAGNOSIS — R51 Headache: Secondary | ICD-10-CM

## 2019-01-19 DIAGNOSIS — I69351 Hemiplegia and hemiparesis following cerebral infarction affecting right dominant side: Secondary | ICD-10-CM | POA: Diagnosis not present

## 2019-01-19 DIAGNOSIS — R519 Headache, unspecified: Secondary | ICD-10-CM | POA: Insufficient documentation

## 2019-01-19 NOTE — Telephone Encounter (Signed)
Medicare order sent to [facility]. No auth they will reach out to the pt to schedule.  °

## 2019-01-19 NOTE — Progress Notes (Signed)
PATIENT: Mary Nguyen DOB: [birth year]  Chief Complaint  Patient presents with  . Tremors    She resides at [facility] and is here today with her aid, Varney Biles. History of strokes. Reports intermittent tremors in her lips and left hand.  States she did have aphasia back in October 2019 but this symptom has resolved.   Marland Kitchen PCP    Raymondo Band, MD - referred from hospital admission in 09/2018     HISTORICAL  Nira Visscher is a 68 year old female, seen in request by her primary care physician Dr. Keenan Bachelor, Sharol Roussel A, for evaluation of tremor, initial evaluation was on January 19, 2019.  She is currently a resident at [facility], she is accompanied by her aide Varney Biles at today's visit,  I have reviewed and summarized the referring note from the referring physician. 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  She has history of chronic migraine headaches, used to happen intermittently, few times each month, since October 2019, she has increased frequency of headaches, almost daily basis, bilateral frontal, 6 out of 10, also noticed increased left hand tremor, has to be fed by her staff, she has been taking Tylenol twice a day as needed for headache  REVIEW OF SYSTEMS: Full 14 system review of systems performed and notable only for weight gain, chest pain, wheezing, headache, numbness, weakness, insomnia, restless leg, anxiety, easy bruising All other review of systems were negative.  ALLERGIES: Allergies  Allergen Reactions  . Penicillins Anaphylaxis    Has patient had a PCN reaction causing immediate rash,  facial/tongue/throat swelling, SOB or lightheadedness with hypotension: Yes Has patient had a PCN reaction causing severe rash involving mucus membranes or skin necrosis: No Has patient had a PCN reaction that required hospitalization: Yes Has patient had a PCN reaction occurring within the last 10 years: No If all of the above answers are "NO", then may proceed with Cephalosporin use.     HOME MEDICATIONS: Current Outpatient Medications  Medication Sig Dispense Refill  . acetaminophen (TYLENOL) 325 MG tablet Take 2 tablets (650 mg total) by mouth every 6 (six) hours.    Marland Kitchen acetaminophen (TYLENOL) 500 MG tablet Take 1,000 mg by mouth 2 (two) times daily.    Marland Kitchen albuterol (PROVENTIL HFA;VENTOLIN HFA) 108 (90 Base) MCG/ACT inhaler Inhale 2 puffs into the lungs every 6 (six) hours as needed for wheezing or shortness of breath.    Marland Kitchen albuterol (PROVENTIL) (2.5 MG/3ML) 0.083% nebulizer solution Take 3 mLs (2.5 mg total) by nebulization every 4 (four) hours as needed for wheezing or shortness of breath. 75 mL 12  . aspirin EC 81 MG EC tablet Take 1 tablet (81 mg total) by mouth daily. 30 tablet 0  . atorvastatin (LIPITOR) 80 MG tablet Take 1 tablet (80 mg total) by mouth daily at 6 PM. 30 tablet 0  . busPIRone (BUSPAR) 5 MG tablet Take 10 mg by mouth 3 (three) times daily.    . calcium-vitamin D (OSCAL WITH D) 500-200 MG-UNIT tablet Take 1 tablet by mouth daily with breakfast. 30 tablet 0  . carvedilol (COREG) 3.125 MG tablet Take 1 tablet (3.125 mg  total) by mouth 2 (two) times daily with a meal.    . clopidogrel (PLAVIX) 75 MG tablet Take 1 tablet (75 mg total) by mouth daily.    . DULoxetine (CYMBALTA) 30 MG capsule Take 30 mg by mouth 2 (two) times daily.     . furosemide (LASIX) 20 MG tablet Take 10 mg by mouth daily. For 2 weeks then stop  01/10/2019    . guaiFENesin (ROBITUSSIN) 100 MG/5ML SOLN Take 5 mLs (100 mg total) by mouth every 4 (four) hours as needed for cough or to loosen phlegm. 1200 mL  0  . ipratropium-albuterol (DUONEB) 0.5-2.5 (3) MG/3ML SOLN Take 3 mLs by nebulization 3 (three) times daily.    . isosorbide mononitrate (IMDUR) 30 MG 24 hr tablet Take 1 tablet (30 mg total) by mouth daily. 30 tablet 0  . Melatonin 3 MG TABS Take 3 mg by mouth at bedtime.    . methocarbamol (ROBAXIN) 750 MG tablet Take 750 mg by mouth at bedtime.    . mirtazapine (REMERON) 7.5 MG tablet Take 1 tablet (7.5 mg total) by mouth at bedtime. 30 tablet 0  . nitroGLYCERIN (NITROSTAT) 0.4 MG SL tablet Place 1 tablet (0.4 mg total) under the tongue every 5 (five) minutes as needed for chest pain. 30 tablet 0  . ondansetron (ZOFRAN) 4 MG tablet Take 4 mg by mouth every 6 (six) hours as needed for nausea or vomiting.    Marland Kitchen oxybutynin (DITROPAN) 5 MG tablet Take 5 mg by mouth 2 (two) times daily.    Marland Kitchen oxyCODONE (OXY IR/ROXICODONE) 5 MG immediate release tablet Take 5 mg by mouth every 6 (six) hours as needed for severe pain.    . pantoprazole (PROTONIX) 20 MG tablet Take 1 tablet (20 mg total) by mouth daily. 30 tablet 0  . polyethylene glycol (MIRALAX / GLYCOLAX) packet Take 17 g by mouth daily as needed for mild constipation. 14 each 0  . ranolazine (RANEXA) 1000 MG SR tablet Take 1 tablet (1,000 mg total) by mouth 2 (two) times daily. 360 tablet 0  . senna-docusate (SENOKOT-S) 8.6-50 MG tablet Take 2 tablets by mouth at bedtime as needed for mild constipation. (Patient taking differently: Take 1 tablet by mouth daily. ) 30 tablet   . tiotropium (SPIRIVA) 18 MCG inhalation capsule Place 18 mcg into inhaler and inhale 2 (two) times daily.    Marland Kitchen gabapentin (NEURONTIN) 300 MG capsule Take 1 capsule (300 mg total) by mouth 3 (three) times daily for 7 days. 21 capsule 0   No current facility-administered medications for this visit.     PAST MEDICAL HISTORY: Past Medical History:  Diagnosis Date  . Blind   . Blindness   . Bradycardia    a. pt reports hx of slow HR.  Marland Kitchen CAD in native artery    a. pt reports  "blockage in the back of her heart" sometime in 2018 by cath at [facility].  . Carotid artery disease ([place])    a. pt reports "blockage scraped" R neck artery around 2000.  . Cataract   . Former tobacco use   . Heart murmur   . Hyperlipidemia   . PAD (peripheral artery disease) ([place])    a. she was told she had poor circulation from the waist down.  . Skipped heart beats   . Stroke [facility])    a. multiple strokes - 4 total, first one in her R eye in 2002, most recent one just a few months ago (as  of 03/2018).  . Tremor     PAST SURGICAL HISTORY: Past Surgical History:  Procedure Laterality Date  . LEFT HEART CATH AND CORONARY ANGIOGRAPHY N/A 04/04/2018   Procedure: LEFT HEART CATH AND CORONARY ANGIOGRAPHY;  Surgeon: Belva Crome, MD;  Location: [facility];  Service: Cardiovascular;  Laterality: N/A;  . TUBAL LIGATION      FAMILY HISTORY: Family History  Problem Relation Age of Onset  . Hypertension Mother   . Heart disease Father        details unclear    SOCIAL HISTORY: Social History   Socioeconomic History  . Marital status: Divorced    Spouse name: Not on file  . Number of children: 3  . Years of education: some college  . Highest education level: Not on file  Occupational History  . Occupation: Retired  Scientific laboratory technician  . Financial resource strain: Not on file  . Food insecurity:    Worry: Not on file    Inability: Not on file  . Transportation needs:    Medical: Not on file    Non-medical: Not on file  Tobacco Use  . Smoking status: Former Smoker    Packs/day: 0.50    Years: 10.00    Pack years: 5.00    Types: Cigarettes  . Smokeless tobacco: Never Used  . Tobacco comment: Pt reports she recently quit after 45 years but family says she still smokes  Substance and Sexual Activity  . Alcohol use: Not Currently  . Drug use: Never  . Sexual activity: Not Currently  Lifestyle  . Physical activity:    Days per week: Not on file    Minutes per  session: Not on file  . Stress: Not on file  Relationships  . Social connections:    Talks on phone: Not on file    Gets together: Not on file    Attends religious service: Not on file    Active member of club or organization: Not on file    Attends meetings of clubs or organizations: Not on file    Relationship status: Not on file  . Intimate partner violence:    Fear of current or ex partner: Not on file    Emotionally abused: Not on file    Physically abused: Not on file    Forced sexual activity: Not on file  Other Topics Concern  . Not on file  Social History Narrative   Lives at [facility].   Right-handed prior to stroke.  Now uses left-hand.   1 cup coffee each morning.     PHYSICAL EXAM   Vitals:   01/19/19 0938  BP: 98/62  Pulse: 72    Not recorded      There is no height or weight on file to calculate BMI.  PHYSICAL EXAMNIATION:  Gen: NAD, conversant, well nourised, obese, well groomed                     Cardiovascular: Regular rate rhythm, no peripheral edema, warm, nontender. Eyes: Conjunctivae clear without exudates or hemorrhage Neck: Supple, no carotid bruits. Pulmonary: Clear to auscultation bilaterally   NEUROLOGICAL EXAM:  MENTAL STATUS: Speech:    Speech is normal; fluent and spontaneous with normal comprehension.  Cognition:     Orientation to time, place and person     Normal recent and remote memory     Normal Attention span and concentration     Normal Language, naming, repeating,spontaneous speech  Fund of knowledge   CRANIAL NERVES: CN II: Visual fields are full to confrontation.  Right pupil is oblique, nonreactive, left pupil is round, not reactive CN III, IV, VI: extraocular movement are normal. No ptosis. CN V: Facial sensation is intact to pinprick in all 3 divisions bilaterally. Corneal responses are intact.  CN VII: Face is symmetric with normal eye closure and smile. CN VIII: Hearing is normal to rubbing fingers CN  IX, X: Palate elevates symmetrically. Phonation is normal. CN XI: Head turning and shoulder shrug are intact CN XII: Tongue is midline with normal movements and no atrophy.  MOTOR: Spastic right hemiparesis, fixed contraction of right shoulder, elbow, complains of pain with passive movement, does has antigravity movement of right upper and lower extremity, mild left hand posturing tremor, no significant rigidity or weakness,  REFLEXES: Hyperreflexia on the right side,  SENSORY: Withdraw to pain COORDINATION: Rapid alternating movements and fine finger movements are intact. There is no dysmetria on finger-to-nose and heel-knee-shin.    GAIT/STANCE: Deferred DIAGNOSTIC DATA (LABS, IMAGING, TESTING) - I reviewed patient records, labs, notes, testing and imaging myself where available.   ASSESSMENT AND PLAN  Clarrisa Kaylor is a 68 y.o. female   Spastic right hemiparesis, Daily headaches  ESR C-reactive protein to rule out temporal arteritis  MRI of the brain   Marcial Pacas, M.D. Ph.D.  [facility] [address], [place], [place] [address] Ph: [phone] Fax: [phone]  CC: Raymondo Band, MDReferring Provider

## 2019-01-20 LAB — C-REACTIVE PROTEIN: CRP: 29 mg/L — ABNORMAL HIGH (ref 0–10)

## 2019-01-20 LAB — ANA W/REFLEX IF POSITIVE: Anti Nuclear Antibody(ANA): NEGATIVE

## 2019-01-20 LAB — SEDIMENTATION RATE: Sed Rate: 16 mm/hr (ref 0–40)

## 2019-01-20 LAB — TSH: TSH: 3.26 u[IU]/mL (ref 0.450–4.500)

## 2019-01-20 LAB — VITAMIN B12: Vitamin B-12: 627 pg/mL (ref 232–1245)

## 2019-01-23 ENCOUNTER — Telehealth: Payer: Self-pay | Admitting: Neurology

## 2019-01-23 NOTE — Telephone Encounter (Signed)
Please call patient, laboratory evaluation showed mild elevated C-reactive protein, in the setting of normal ESR, above findings has unknown clinical significance, rest of the laboratory evaluation were within normal limits.

## 2019-01-24 NOTE — Telephone Encounter (Signed)
The patient resides at [facility] [phone]). Left message with the director of nursing, Alyson Locket.  Provided our number to call back with any questions.  Also, faxed results to his attention at [phone].

## 2019-02-17 ENCOUNTER — Telehealth: Payer: Self-pay | Admitting: *Deleted

## 2019-02-17 ENCOUNTER — Ambulatory Visit: Payer: Medicare Other | Admitting: Neurology

## 2019-02-17 NOTE — Progress Notes (Deleted)
PATIENT: Mary Nguyen DOB: 29-Jun-1951  REASON FOR VISIT: follow up HISTORY FROM: patient  HISTORY OF PRESENT ILLNESS: Today 02/17/19  HISTORY  Mary Nguyen is a 68 year old female, seen in request by her primary care physician Dr. Harrison Mons, Threasa Heads A, for evaluation of tremor, initial evaluation was on January 19, 2019.  She is currently a resident at nursing home [facility], she is accompanied by her aide Deanna Artis at today's visit,  I have reviewed and summarized the referring note from the referring physician. 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  She has history of chronic migraine headaches, used to happen intermittently, few times each month, since October 2019, she has increased frequency of headaches, almost daily basis, bilateral frontal, 6 out of 10, also noticed increased left hand tremor, has to be fed by her staff, she has been taking Tylenol twice a day as needed for headache  Update February 17, 2019 SS: 68 year old female evaluated February 2020 for tremor and headache.Lab work done at that time revealed an elevated CRP of 29, normal sed rate 16.  MRI of the brain was ordered.  REVIEW OF SYSTEMS: Out of a complete 14 system review of symptoms, the patient complains only of the following symptoms, and all other reviewed systems are negative.  ALLERGIES: Allergies  Allergen Reactions  . Penicillins Anaphylaxis    Has patient had a PCN reaction causing immediate rash, facial/tongue/throat swelling, SOB or lightheadedness with hypotension: Yes Has patient had a PCN reaction causing severe rash involving mucus  membranes or skin necrosis: No Has patient had a PCN reaction that required hospitalization: Yes Has patient had a PCN reaction occurring within the last 10 years: No If all of the above answers are "NO", then may proceed with Cephalosporin use.     HOME MEDICATIONS: Outpatient Medications Prior to Visit  Medication Sig Dispense Refill  . acetaminophen (TYLENOL) 325 MG tablet Take 2 tablets (650 mg total) by mouth every 6 (six) hours.    Marland Kitchen acetaminophen (TYLENOL) 500 MG tablet Take 1,000 mg by mouth 2 (two) times daily.    Marland Kitchen albuterol (PROVENTIL HFA;VENTOLIN HFA) 108 (90 Base) MCG/ACT inhaler Inhale 2 puffs into the lungs every 6 (six) hours as needed for wheezing or shortness of breath.    Marland Kitchen albuterol (PROVENTIL) (2.5 MG/3ML) 0.083% nebulizer solution Take 3 mLs (2.5 mg total) by nebulization every 4 (four) hours as needed for wheezing or shortness of breath. 75 mL 12  . aspirin EC 81 MG EC tablet Take 1 tablet (81 mg total) by mouth daily. 30 tablet 0  . atorvastatin (LIPITOR) 80 MG tablet Take 1 tablet (80 mg total) by mouth daily at 6 PM. 30 tablet 0  . busPIRone (BUSPAR) 5 MG tablet Take 10 mg by mouth 3 (three) times daily.    . calcium-vitamin D (OSCAL WITH D) 500-200 MG-UNIT tablet Take 1 tablet by mouth daily with breakfast. 30 tablet 0  . carvedilol (COREG) 3.125 MG tablet Take 1 tablet (3.125 mg total) by mouth 2 (two) times daily with a meal.    . clopidogrel (PLAVIX) 75 MG tablet Take 1 tablet (75 mg total) by  mouth daily.    . DULoxetine (CYMBALTA) 30 MG capsule Take 30 mg by mouth 2 (two) times daily.     . furosemide (LASIX) 20 MG tablet Take 10 mg by mouth daily. For 2 weeks then stop  01/10/2019    . gabapentin (NEURONTIN) 300 MG capsule Take 1 capsule (300 mg total) by mouth 3 (three) times daily for 7 days. 21 capsule 0  . guaiFENesin (ROBITUSSIN) 100 MG/5ML SOLN Take 5 mLs (100 mg total) by mouth every 4 (four) hours as needed for cough or to loosen phlegm. 1200 mL 0  .  ipratropium-albuterol (DUONEB) 0.5-2.5 (3) MG/3ML SOLN Take 3 mLs by nebulization 3 (three) times daily.    . isosorbide mononitrate (IMDUR) 30 MG 24 hr tablet Take 1 tablet (30 mg total) by mouth daily. 30 tablet 0  . Melatonin 3 MG TABS Take 3 mg by mouth at bedtime.    . methocarbamol (ROBAXIN) 750 MG tablet Take 750 mg by mouth at bedtime.    . mirtazapine (REMERON) 7.5 MG tablet Take 1 tablet (7.5 mg total) by mouth at bedtime. 30 tablet 0  . nitroGLYCERIN (NITROSTAT) 0.4 MG SL tablet Place 1 tablet (0.4 mg total) under the tongue every 5 (five) minutes as needed for chest pain. 30 tablet 0  . ondansetron (ZOFRAN) 4 MG tablet Take 4 mg by mouth every 6 (six) hours as needed for nausea or vomiting.    Marland Kitchen oxybutynin (DITROPAN) 5 MG tablet Take 5 mg by mouth 2 (two) times daily.    Marland Kitchen oxyCODONE (OXY IR/ROXICODONE) 5 MG immediate release tablet Take 5 mg by mouth every 6 (six) hours as needed for severe pain.    . pantoprazole (PROTONIX) 20 MG tablet Take 1 tablet (20 mg total) by mouth daily. 30 tablet 0  . polyethylene glycol (MIRALAX / GLYCOLAX) packet Take 17 g by mouth daily as needed for mild constipation. 14 each 0  . ranolazine (RANEXA) 1000 MG SR tablet Take 1 tablet (1,000 mg total) by mouth 2 (two) times daily. 360 tablet 0  . senna-docusate (SENOKOT-S) 8.6-50 MG tablet Take 2 tablets by mouth at bedtime as needed for mild constipation. (Patient taking differently: Take 1 tablet by mouth daily. ) 30 tablet   . tiotropium (SPIRIVA) 18 MCG inhalation capsule Place 18 mcg into inhaler and inhale 2 (two) times daily.     No facility-administered medications prior to visit.     PAST MEDICAL HISTORY: Past Medical History:  Diagnosis Date  . Blind   . Blindness   . Bradycardia    a. pt reports hx of slow HR.  Marland Kitchen CAD in native artery    a. pt reports "blockage in the back of her heart" sometime in 2018 by cath at [facility].  . Carotid artery disease ([facility])    a. pt reports "blockage  scraped" R neck artery around 2000.  . Cataract   . Former tobacco use   . Heart murmur   . Hyperlipidemia   . PAD (peripheral artery disease) ([facility])    a. she was told she had poor circulation from the waist down.  . Skipped heart beats   . Stroke [facility])    a. 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  . Tremor     PAST SURGICAL HISTORY: Past Surgical History:  Procedure Laterality Date  . LEFT HEART CATH AND CORONARY ANGIOGRAPHY  N/A 04/04/2018   Procedure: LEFT HEART CATH AND CORONARY ANGIOGRAPHY;  Surgeon: Lyn Records, MD;  Location: [facility] IN[facility]ASIVE [facility];  Service: Cardiovascular;  Laterality: N/A;  . TUBAL LIGATION      FAMILY HISTORY: Family History  Problem Relation Age of Onset  . Hypertension Mother   . Heart disease Father        details unclear    SOCIAL HISTORY: Social History   Socioeconomic History  . Marital status: Divorced    Spouse name: Not on file  . Number of children: 3  . Years of education: some college  . Highest education level: Not on file  Occupational History  . Occupation: Retired  Engineer, production  . Financial resource strain: Not on file  . Food insecurity:    Worry: Not on file    Inability: Not on file  . Transportation needs:    Medical: Not on file    Non-medical: Not on file  Tobacco Use  . Smoking status: Former Smoker    Packs/day: 0.50    Years: 10.00    Pack years: 5.00    Types: Cigarettes  . Smokeless tobacco: Never Used  . Tobacco comment: Pt reports she recently quit after 45 years but family says she still smokes  Substance and Sexual Activity  . Alcohol use: Not Currently  . Drug use: Never  . Sexual activity: Not Currently  Lifestyle  . Physical activity:    Days per week: Not on file    Minutes per session: Not on file  . Stress: Not on file  Relationships  . Social connections:    Talks on phone: Not on file    Gets together: Not on  file    Attends religious service: Not on file    Active member of club or organization: Not on file    Attends meetings of clubs or organizations: Not on file    Relationship status: Not on file  . Intimate partner violence:    Fear of current or ex partner: Not on file    Emotionally abused: Not on file    Physically abused: Not on file    Forced sexual activity: Not on file  Other Topics Concern  . Not on file  Social History Narrative   Lives at [facility].   Right-handed prior to stroke.  Now uses left-hand.   1 cup coffee each morning.      PHYSICAL EXAM  There were no vitals filed for this visit. There is no height or weight on file to calculate BMI.  Generalized: Well developed, in no acute distress   Neurological examination  Mentation: Alert oriented to time, place, history taking. Follows all commands speech and language fluent Cranial nerve II-XII: Pupils were equal round reactive to light. Extraocular movements were full, visual field were full on confrontational test. Facial sensation and strength were normal. Uvula tongue midline. Head turning and shoulder shrug  were normal and symmetric. Motor: The motor testing reveals 5 over 5 strength of all 4 extremities. Good symmetric motor tone is noted throughout.  Sensory: Sensory testing is intact to soft touch on all 4 extremities. No evidence of extinction is noted.  Coordination: Cerebellar testing reveals good finger-nose-finger and heel-to-shin bilaterally.  Gait and station: Gait is normal. Tandem gait is normal. Romberg is negative. No drift is seen.  Reflexes: Deep tendon reflexes are symmetric and normal bilaterally.   DIAGNOSTIC DATA (LABS, IMAGING, TESTING) - I reviewed patient records, labs, notes,  testing and imaging myself where available.  Lab Results  Component Value Date   WBC 7.9 01/05/2019   HGB 11.0 (L) 01/05/2019   HCT 35.8 (L) 01/05/2019   MCV 97.0 01/05/2019   PLT 344 01/05/2019       Component Value Date/Time   NA 136 01/05/2019 2333   NA 142 11/16/2018 1106   K 3.6 01/05/2019 2333   CL 96 (L) 01/05/2019 2333   CO2 28 01/05/2019 2333   GLUCOSE 86 01/05/2019 2333   BUN 17 01/05/2019 2333   BUN 20 11/16/2018 1106   CREATININE 0.96 01/05/2019 2333   CALCIUM 9.2 01/05/2019 2333   PROT 7.5 01/05/2019 2333   ALBUMIN 3.4 (L) 01/05/2019 2333   AST 27 01/05/2019 2333   ALT 25 01/05/2019 2333   ALKPHOS 91 01/05/2019 2333   BILITOT 0.4 01/05/2019 2333   GFRNONAA >60 01/05/2019 2333   GFRAA >60 01/05/2019 2333   Lab Results  Component Value Date   CHOL 227 (H) 04/02/2018   HDL 41 04/02/2018   LDLCALC 173 (H) 04/02/2018   TRIG 65 04/02/2018   CHOLHDL 5.5 04/02/2018   No results found for: HGBA1C Lab Results  Component Value Date   VITAMINB12 627 01/19/2019   Lab Results  Component Value Date   TSH 3.260 01/19/2019      ASSESSMENT AND PLAN 68 y.o. year old female  has a past medical history of Blind, Blindness, Bradycardia, CAD in native artery, Carotid artery disease (HCC), Cataract, Former tobacco use, Heart murmur, Hyperlipidemia, PAD (peripheral artery disease) ([facility]), Skipped heart beats, Stroke (HCC), and Tremor. here with ***   I spent 15 minutes with the patient. 50% of this time was spent   Margie Ege, [place], DNP 02/17/2019, 8:28 AM [facility] [address] [place], [place] [address] [phone]

## 2019-02-17 NOTE — Telephone Encounter (Signed)
Pt no showed her visit with NP today.

## 2019-07-05 ENCOUNTER — Telehealth: Payer: Self-pay | Admitting: *Deleted

## 2019-07-05 NOTE — Telephone Encounter (Signed)
A message was left, re: follow visit on the son voice mail.

## 2019-07-08 DEATH — deceased

## 2019-08-22 ENCOUNTER — Telehealth: Payer: Self-pay | Admitting: Internal Medicine

## 2019-08-22 NOTE — Telephone Encounter (Signed)
New Message:   Son said pt passed away on 06-29-19.

## 2019-08-22 NOTE — Telephone Encounter (Signed)
FYI

## 2019-11-01 ENCOUNTER — Telehealth: Payer: Self-pay | Admitting: Internal Medicine

## 2019-11-01 NOTE — Telephone Encounter (Signed)
PATIENT'S SON STATED THAT PATIENT IS DECEASED AS OF Jul 07, 2023

## 2020-01-25 IMAGING — MR MR HEAD W/O CM
9 of 10 series · 37 of 48 positions shown · non-contrast
Comparison: 03/23/2018 CT head

CLINICAL DATA: 66 y/o F; left upper extremity and left lower
extremity weakness. Left facial droop and slurred speech.

EXAM:
MRI HEAD WITHOUT CONTRAST
TECHNIQUE: Multiplanar, multiecho pulse sequences of the brain and surrounding
structures were obtained without intravenous contrast.

[Series 3: DWI · axial · 3.0mm · 1.09mm/px · z∈[-33,+105]mm · 11 of 94 slices shown (1 of 4)]
[im 1/94]
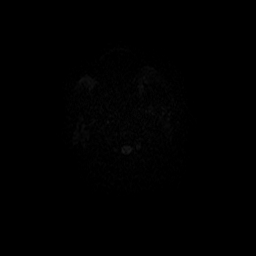
[im 10/94]
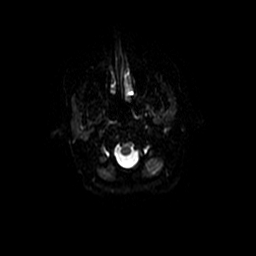
[im 19/94]
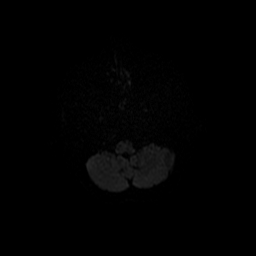
[im 28/94]
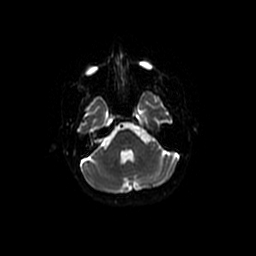
[im 38/94]
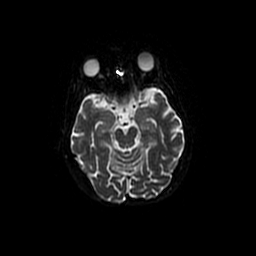
[im 47/94]
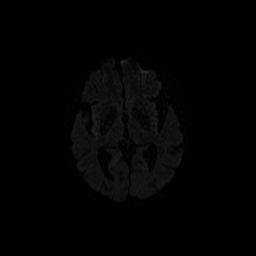
[im 56/94]
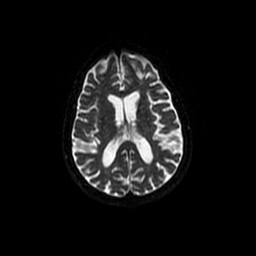
[im 66/94]
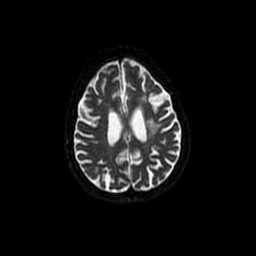
[im 75/94]
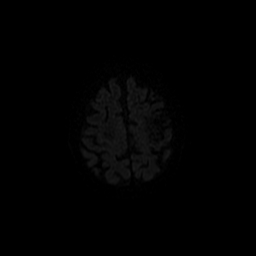
[im 84/94]
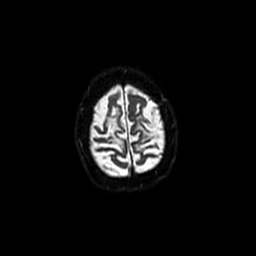
[im 94/94]
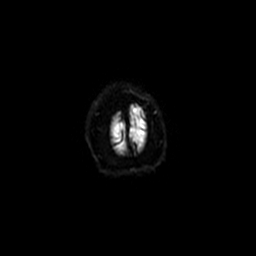

[Series 4: DWI · coronal · 5.0mm · 1.09mm/px · 6 of 60 slices shown (2 of 4)]
[im 1/60]
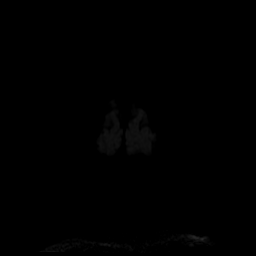
[im 12/60]
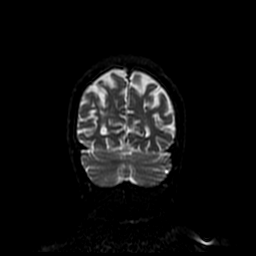
[im 24/60]
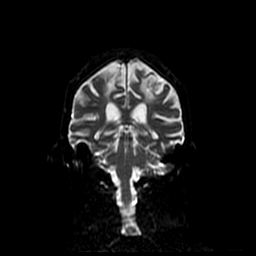
[im 36/60]
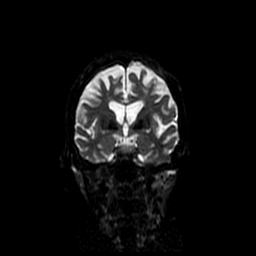
[im 48/60]
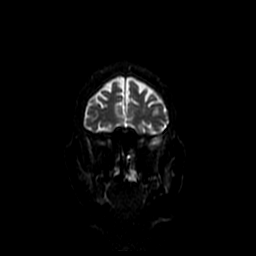
[im 60/60]
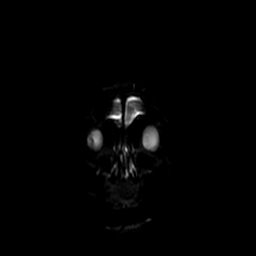

[Series 5: T1 · sagittal · 5.0mm · 0.47mm/px · 2 of 23 slices shown]
[im 1/23]
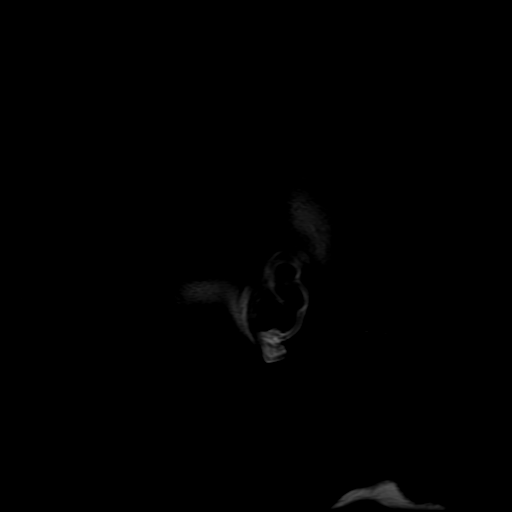
[im 23/23]
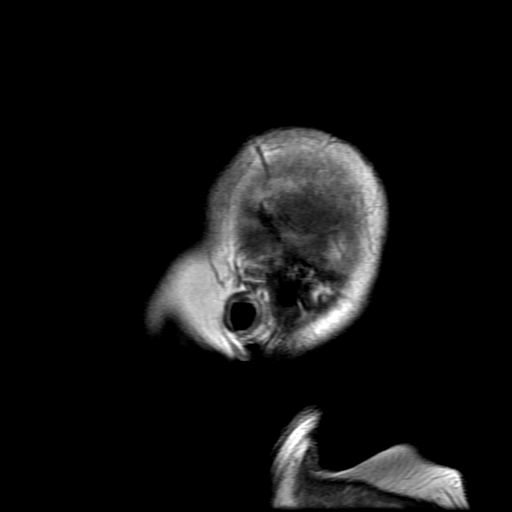

[Series 6: T2 · axial · 5.0mm · 0.43mm/px · z∈[-35,+102]mm · 3 of 24 slices shown (1 of 2)]
[im 1/24]
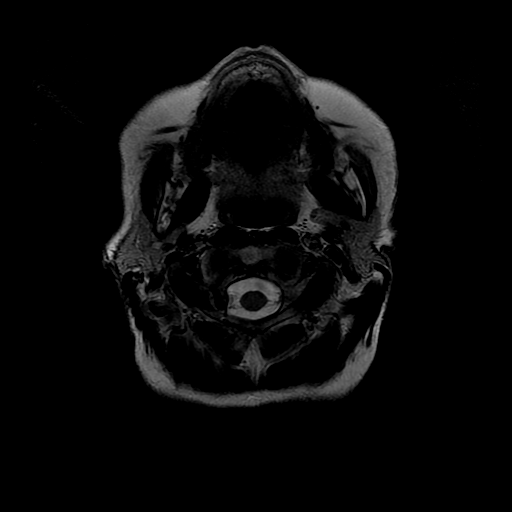
[im 12/24]
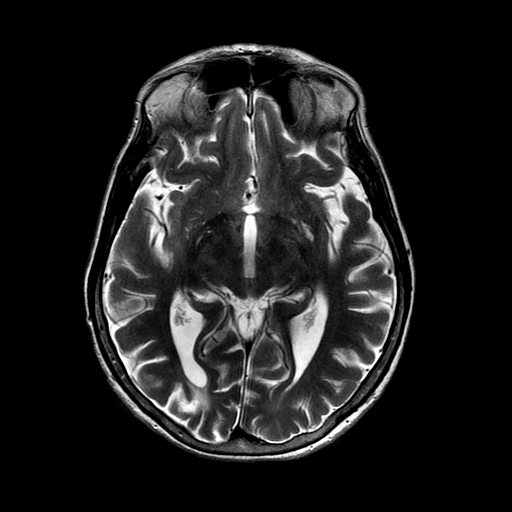
[im 24/24]
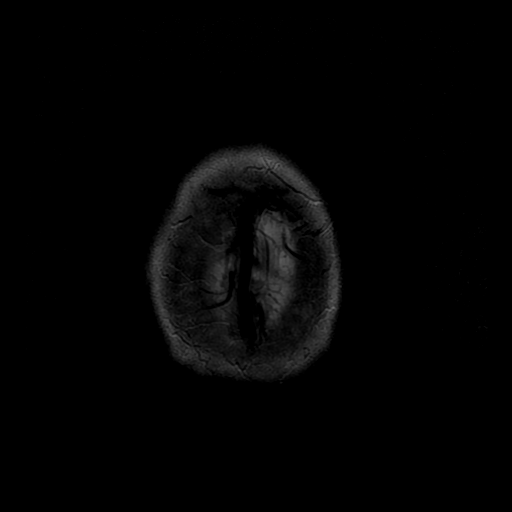

[Series 7: FLAIR · axial · 3.0mm · 0.43mm/px · z∈[-35,+102]mm · 3 of 24 slices shown]
[im 1/24]
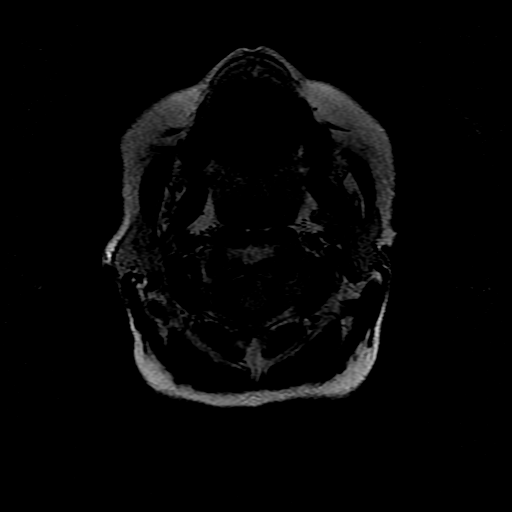
[im 12/24]
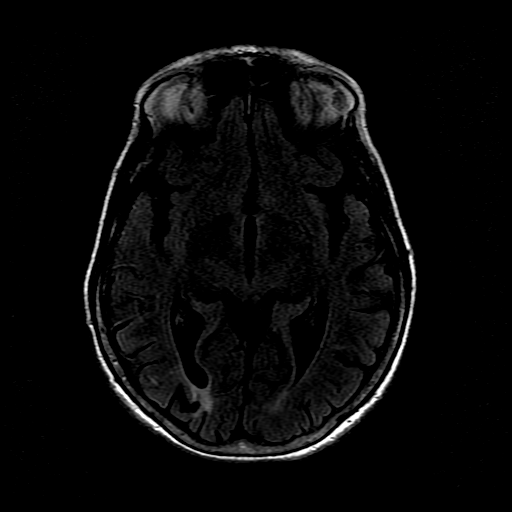
[im 24/24]
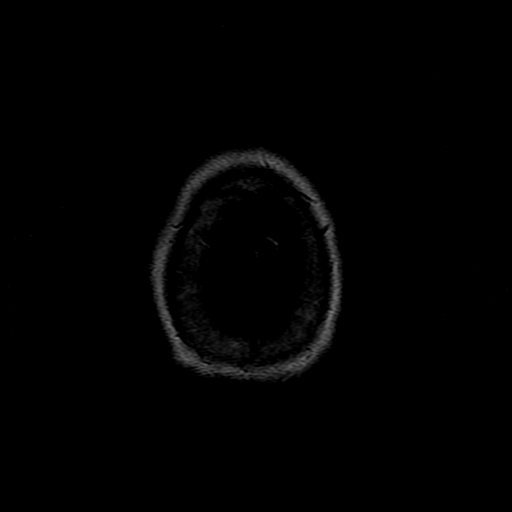

[Series 8: ax mpgr · axial · 5.0mm · 0.43mm/px · z∈[-35,+31]mm · 2 of 24 slices shown]
[im 1/24]
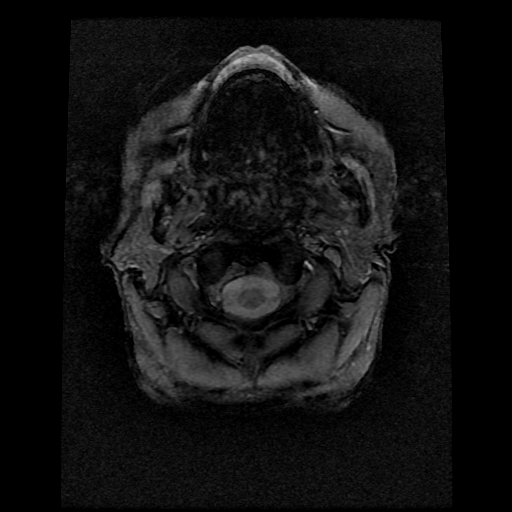
[im 12/24]
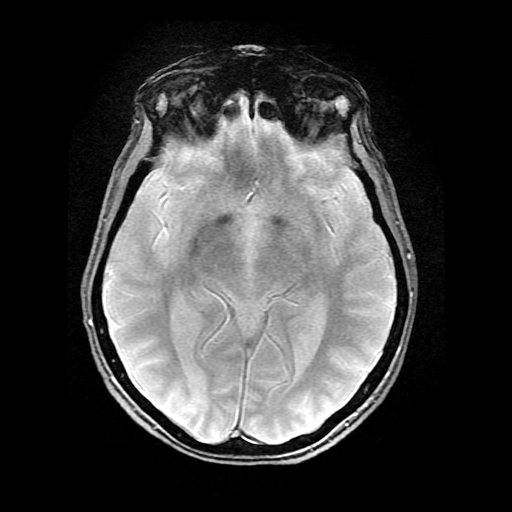

[Series 10: T2 · coronal · 5.0mm · 0.39mm/px · 2 of 23 slices shown (2 of 2)]
[im 1/23]
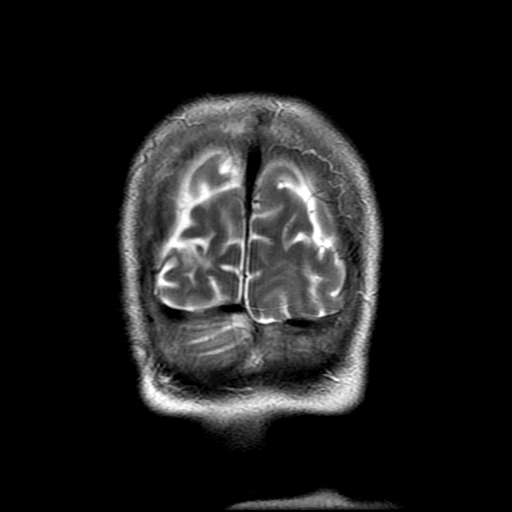
[im 23/23]
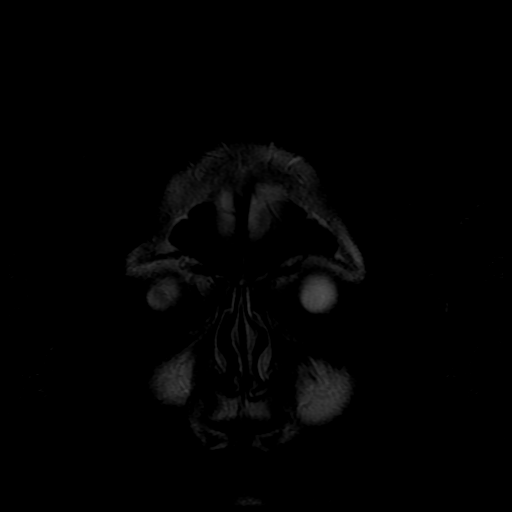

[Series 300: DWI · axial · 3.0mm · 1.09mm/px · z∈[-33,+105]mm · 5 of 47 slices shown (3 of 4)]
[im 1/47]
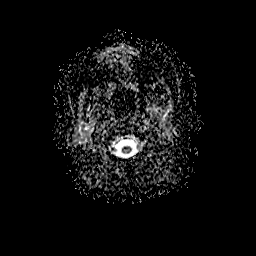
[im 12/47]
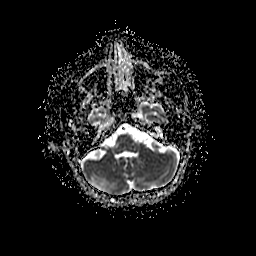
[im 24/47]
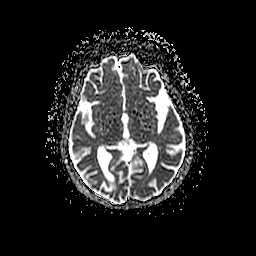
[im 35/47]
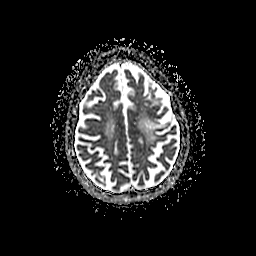
[im 47/47]
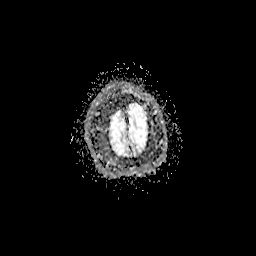

[Series 400: DWI · coronal · 5.0mm · 1.09mm/px · 3 of 30 slices shown (4 of 4)]
[im 1/30]
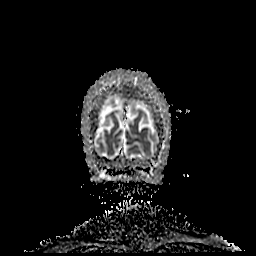
[im 15/30]
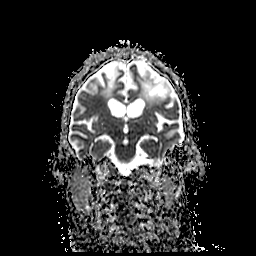
[im 30/30]
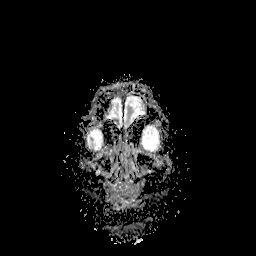

[37 of 48 positions shown; findings below may reference images not displayed]

FINDINGS: Brain: No acute infarction, hemorrhage, hydrocephalus, extra-axial
collection or mass lesion. Chronic infarctions are present in the
bilateral frontal lobes and right parietal lobe. There is faint
siderosis associated with the left frontal cortical infarction.
There are microvascular ischemic changes of white matter and
parenchymal volume loss of the brain.

Vascular: Normal flow voids.

Skull and upper cervical spine: Normal marrow signal.

Sinuses/Orbits: Opacification of the right mastoid tips. Left
intra-ocular lens replacement. Otherwise negative.

Other: None.
IMPRESSION: 1. No acute intracranial abnormality identified.
2. Bifrontal and right parietal chronic infarctions.

By: Jayquan Uhl M.D.
